# Patient Record
Sex: Female | Born: 1990 | Race: White | Hispanic: No | Marital: Single | Smoking: Former smoker
Health system: Southern US, Community
[De-identification: ages and names within clinical notes are randomized; demographics above are authoritative.]

## PROBLEM LIST (undated history)

## (undated) DIAGNOSIS — N200 Calculus of kidney: Secondary | ICD-10-CM

## (undated) DIAGNOSIS — D649 Anemia, unspecified: Secondary | ICD-10-CM

## (undated) DIAGNOSIS — Z23 Encounter for immunization: Secondary | ICD-10-CM

## (undated) DIAGNOSIS — E282 Polycystic ovarian syndrome: Secondary | ICD-10-CM

## (undated) DIAGNOSIS — N12 Tubulo-interstitial nephritis, not specified as acute or chronic: Secondary | ICD-10-CM

## (undated) DIAGNOSIS — N390 Urinary tract infection, site not specified: Secondary | ICD-10-CM

## (undated) DIAGNOSIS — B009 Herpesviral infection, unspecified: Secondary | ICD-10-CM

## (undated) DIAGNOSIS — A419 Sepsis, unspecified organism: Secondary | ICD-10-CM

## (undated) DIAGNOSIS — A599 Trichomoniasis, unspecified: Secondary | ICD-10-CM

## (undated) HISTORY — DX: Tubulo-interstitial nephritis, not specified as acute or chronic: N12

## (undated) HISTORY — DX: Urinary tract infection, site not specified: N39.0

## (undated) HISTORY — DX: Herpesviral infection, unspecified: B00.9

## (undated) HISTORY — DX: Encounter for immunization: Z23

## (undated) HISTORY — DX: Trichomoniasis, unspecified: A59.9

## (undated) HISTORY — PX: WISDOM TOOTH EXTRACTION: SHX21

## (undated) HISTORY — DX: Calculus of kidney: N20.0

---
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
## 2011-03-08 ENCOUNTER — Ambulatory Visit: Payer: Self-pay | Admitting: Family Medicine

## 2011-05-23 ENCOUNTER — Emergency Department: Payer: Self-pay | Admitting: Emergency Medicine

## 2011-09-08 ENCOUNTER — Ambulatory Visit: Payer: Self-pay

## 2011-10-06 NOTE — L&D Delivery Note (Signed)
I have seen and examined this patient and I agree with the above. Kylie Martin 4:27 AM 06/22/2012

## 2011-10-06 NOTE — L&D Delivery Note (Addendum)
Delivery Note At 10:59 PM a viable and healthy female was delivered via Vaginal, Spontaneous Delivery (Presentation: Left Occiput Anterior).  Pushed 3.5 hours and changed position to lateral lying and hands and knees, then back to semi-fowlers.  APGAR: 7, 9; weight pending.   Placenta status: Intact, Spontaneous.  Cord: 3 vessels with the following complications: None.    Anesthesia: Epidural  Episiotomy: None Lacerations: minor 1st degree, no repair needed Suture Repair: n/a Est. Blood Loss (mL): 450cc  Mom to postpartum.  Baby to nursery-stable.  Simone Curia 06/21/2012, 11:26 PM  Cord around neck x 1, reduced prior to del. I have seen and examined this patient and I agree with the above. Cam Hai 4:29 AM 06/22/2012

## 2011-11-04 ENCOUNTER — Emergency Department: Payer: Self-pay

## 2011-11-04 LAB — URINALYSIS, COMPLETE
Nitrite: NEGATIVE
Ph: 6 (ref 4.5–8.0)
Protein: NEGATIVE
Specific Gravity: 1.017 (ref 1.003–1.030)
WBC UR: 53 /HPF (ref 0–5)

## 2011-11-04 LAB — HCG, QUANTITATIVE, PREGNANCY: Beta Hcg, Quant.: 177307 m[IU]/mL — ABNORMAL HIGH

## 2011-11-04 LAB — WET PREP, GENITAL

## 2011-12-17 ENCOUNTER — Emergency Department: Payer: Self-pay | Admitting: Emergency Medicine

## 2011-12-18 LAB — CBC
MCH: 28.6 pg (ref 26.0–34.0)
MCHC: 33.4 g/dL (ref 32.0–36.0)
MCV: 86 fL (ref 80–100)
Platelet: 232 10*3/uL (ref 150–440)
RDW: 15.1 % — ABNORMAL HIGH (ref 11.5–14.5)

## 2011-12-18 LAB — HCG, QUANTITATIVE, PREGNANCY: Beta Hcg, Quant.: 71314 m[IU]/mL — ABNORMAL HIGH

## 2011-12-18 LAB — URINALYSIS, COMPLETE
Ketone: NEGATIVE
Nitrite: POSITIVE
Ph: 6 (ref 4.5–8.0)
Protein: NEGATIVE
RBC,UR: 5 /HPF (ref 0–5)
WBC UR: 144 /HPF (ref 0–5)

## 2012-01-15 LAB — OB RESULTS CONSOLE HIV ANTIBODY (ROUTINE TESTING): HIV: NONREACTIVE

## 2012-01-15 LAB — OB RESULTS CONSOLE GBS: GBS: POSITIVE

## 2012-01-15 LAB — OB RESULTS CONSOLE GC/CHLAMYDIA: Gonorrhea: NEGATIVE

## 2012-01-15 LAB — OB RESULTS CONSOLE ABO/RH: RH Type: NEGATIVE

## 2012-01-28 ENCOUNTER — Encounter (HOSPITAL_COMMUNITY): Payer: Self-pay

## 2012-01-28 ENCOUNTER — Emergency Department (HOSPITAL_COMMUNITY): Payer: Medicaid Other

## 2012-01-28 ENCOUNTER — Emergency Department (HOSPITAL_COMMUNITY)
Admission: EM | Admit: 2012-01-28 | Discharge: 2012-01-28 | Disposition: A | Discharge status: Home / Self Care | Payer: Medicaid Other | Attending: Emergency Medicine | Admitting: Emergency Medicine

## 2012-01-28 DIAGNOSIS — N949 Unspecified condition associated with female genital organs and menstrual cycle: Secondary | ICD-10-CM | POA: Insufficient documentation

## 2012-01-28 DIAGNOSIS — O26899 Other specified pregnancy related conditions, unspecified trimester: Secondary | ICD-10-CM

## 2012-01-28 DIAGNOSIS — R1031 Right lower quadrant pain: Secondary | ICD-10-CM | POA: Insufficient documentation

## 2012-01-28 DIAGNOSIS — O9989 Other specified diseases and conditions complicating pregnancy, childbirth and the puerperium: Secondary | ICD-10-CM | POA: Insufficient documentation

## 2012-01-28 DIAGNOSIS — R300 Dysuria: Secondary | ICD-10-CM | POA: Insufficient documentation

## 2012-01-28 HISTORY — DX: Anemia, unspecified: D64.9

## 2012-01-28 LAB — URINALYSIS, ROUTINE W REFLEX MICROSCOPIC
Hgb urine dipstick: NEGATIVE
Nitrite: NEGATIVE
Protein, ur: NEGATIVE mg/dL
Specific Gravity, Urine: 1.01 (ref 1.005–1.030)
Urobilinogen, UA: 0.2 mg/dL (ref 0.0–1.0)

## 2012-01-28 LAB — COMPREHENSIVE METABOLIC PANEL WITH GFR
ALT: 12 U/L (ref 0–35)
AST: 16 U/L (ref 0–37)
Albumin: 2.8 g/dL — ABNORMAL LOW (ref 3.5–5.2)
Alkaline Phosphatase: 79 U/L (ref 39–117)
BUN: 8 mg/dL (ref 6–23)
CO2: 26 meq/L (ref 19–32)
Calcium: 8.9 mg/dL (ref 8.4–10.5)
Chloride: 103 meq/L (ref 96–112)
Creatinine, Ser: 0.8 mg/dL (ref 0.50–1.10)
GFR calc Af Amer: >90 mL/min
GFR calc non Af Amer: >90 mL/min
Glucose, Bld: 88 mg/dL (ref 70–99)
Potassium: 3.3 meq/L — ABNORMAL LOW (ref 3.5–5.1)
Sodium: 137 meq/L (ref 135–145)
Total Bilirubin: 0.2 mg/dL — ABNORMAL LOW (ref 0.3–1.2)
Total Protein: 6.6 g/dL (ref 6.0–8.3)

## 2012-01-28 LAB — CBC
HCT: 36 % (ref 36.0–46.0)
Hemoglobin: 12.3 g/dL (ref 12.0–15.0)
MCH: 29.1 pg (ref 26.0–34.0)
MCHC: 34.2 g/dL (ref 30.0–36.0)
MCV: 85.1 fL (ref 78.0–100.0)
Platelets: 236 K/uL (ref 150–400)
RBC: 4.23 MIL/uL (ref 3.87–5.11)
RDW: 13.7 % (ref 11.5–15.5)
WBC: 10.9 K/uL — ABNORMAL HIGH (ref 4.0–10.5)

## 2012-01-28 LAB — DIFFERENTIAL
Basophils Absolute: 0 K/uL (ref 0.0–0.1)
Basophils Relative: 0 % (ref 0–1)
Eosinophils Absolute: 0.2 K/uL (ref 0.0–0.7)
Eosinophils Relative: 2 % (ref 0–5)
Lymphocytes Relative: 21 % (ref 12–46)
Lymphs Abs: 2.2 K/uL (ref 0.7–4.0)
Monocytes Absolute: 0.7 K/uL (ref 0.1–1.0)
Monocytes Relative: 7 % (ref 3–12)
Neutro Abs: 7.7 K/uL (ref 1.7–7.7)
Neutrophils Relative %: 70 % (ref 43–77)

## 2012-01-28 LAB — LIPASE, BLOOD: Lipase: 21 U/L (ref 11–59)

## 2012-01-28 MED ORDER — SODIUM CHLORIDE 0.9 % IV SOLN
Freq: Once | INTRAVENOUS | Status: AC
  Administered 2012-01-28: 05:00:00 via INTRAVENOUS

## 2012-01-28 MED ORDER — MORPHINE SULFATE 4 MG/ML IJ SOLN
4.0000 mg | Freq: Once | INTRAMUSCULAR | Status: AC
  Administered 2012-01-28: 4 mg via INTRAVENOUS
  Filled 2012-01-28: qty 1

## 2012-01-28 MED ORDER — ONDANSETRON HCL 4 MG/2ML IJ SOLN
4.0000 mg | Freq: Once | INTRAMUSCULAR | Status: AC
  Administered 2012-01-28: 4 mg via INTRAVENOUS
  Filled 2012-01-28: qty 2

## 2012-01-28 NOTE — ED Provider Notes (Signed)
History     CSN: [id number]  Arrival date & time 01/28/12  4098   First MD Initiated Contact with Patient 01/28/12 [phone]      Chief Complaint  Patient presents with  . Abdominal Cramping    (Consider location/radiation/quality/duration/timing/severity/associated sxs/prior treatment) HPI  Past Medical History  Diagnosis Date  . Anemia     History reviewed. No pertinent past surgical history.  History reviewed. No pertinent family history.  History  Substance Use Topics  . Smoking status: Never Smoker   . Smokeless tobacco: Not on file  . Alcohol Use: No    OB History    Grav Para Term Preterm Abortions TAB SAB Ect Mult Living   3    2           Review of Systems  Allergies  Review of patient's allergies indicates no known allergies.  Home Medications  No current outpatient prescriptions on file.  BP 124/69  Pulse 71  Temp(Src) 98.5 F (36.9 C) (Oral)  Resp 18  Ht 5\' 6"  (1.676 m)  Wt 155 lb (70.308 kg)  BMI 25.02 kg/m2  SpO2 97%  Physical Exam  ED Course  Procedures (including critical care time)  Labs Reviewed  CBC - Abnormal; Notable for the following:    WBC 10.9 (*)    All other components within normal limits  COMPREHENSIVE METABOLIC PANEL - Abnormal; Notable for the following:    Potassium 3.3 (*)    Albumin 2.8 (*)    Total Bilirubin 0.2 (*)    All other components within normal limits  DIFFERENTIAL  LIPASE, BLOOD  URINALYSIS, ROUTINE W REFLEX MICROSCOPIC   US Ob Limited  01/28/2012  *RADIOLOGY REPORT*  Clinical Data: Pelvic pain with cramping  LIMITED OBSTETRIC ULTRASOUND  Number of Fetuses: 1 Heart Rate: 135bpm Movement: Seen Presentation: Breech Placental Location: Posterior fundal, left lateral Previa: No Amniotic Fluid (Subjective): Normal, vertical pocket 4.4 cm  BPD: 4.71cm   20w   2d  MATERNAL FINDINGS: Cervix: 4.9 cm/ Uterus/Adnexae: Ovaries not seen, no pelvic fluid  IMPRESSION: Single living intrauterine pregnancy  demonstrating an estimated gestational age by BPD alone of 20 weeks 2 days.  This correlates well with provided assigned gestational age of [redacted] weeks 5 days with corresponding EDC of 06/11/2012.  The cervix appears long and closed and the visualized portion of the placenta appears unremarkable.  Subjectively normal amniotic fluid volume  Recommend followup with non-emergent complete OB 14+ wk US examination for fetal biometric evaluation and anatomic survey. This could be performed at the [facility] of [place].  Original Report Authenticated By: Bertha Stakes, M.D.     1. Pelvic pain complicating pregnancy       MDM  Recheck prior to discharge. Patient is in no acute distress. No vaginal bleeding or discharge. Ultrasound normal        Donnetta Hutching, MD 01/28/12 [phone]

## 2012-01-28 NOTE — Progress Notes (Signed)
Notified @0358  of pt arrival at [facility] ED.  Pt is 20+wks, c/o abd.pain for 2 hours, no leaking fluid/bleeding reported.  FHT 145bpm when fetal monitoring started.

## 2012-01-28 NOTE — ED Provider Notes (Signed)
History     CSN: [id number]  Arrival date & time 01/28/12  3086   First MD Initiated Contact with Patient 01/28/12 [phone]      Chief Complaint  Patient presents with  . Abdominal Cramping    (Consider location/radiation/quality/duration/timing/severity/associated sxs/prior treatment) Patient is a 21 y.o. female presenting with cramps. The history is provided by the patient.  Abdominal Cramping  She noted gradual onset about 2 hours ago of pain in the right lower abdomen without radiation. Pain is worse with palpation but nothing else seems to affect it. There is no associated nausea, vomiting, diarrhea, constipation. She has noted some dysuria. There is no change in the urinary frequency of pregnancy. She denies fever, chills, sweats and denies flank pain. There's been no vaginal bleeding. She is [redacted] weeks pregnant and there've been no complications of pregnancy. She is gravida 3, para0 with 2 miscarriages.she has not done anything for pain at home. She rates the pain at 7/10 currently but it was 10/10 at its worst.  Past Medical History  Diagnosis Date  . Anemia     History reviewed. No pertinent past surgical history.  History reviewed. No pertinent family history.  History  Substance Use Topics  . Smoking status: Never Smoker   . Smokeless tobacco: Not on file  . Alcohol Use: No    OB History    Grav Para Term Preterm Abortions TAB SAB Ect Mult Living   3    2           Review of Systems  All other systems reviewed and are negative.    Allergies  Review of patient's allergies indicates no known allergies.  Home Medications  No current outpatient prescriptions on file.  BP 124/69  Pulse 71  Temp(Src) 98.5 F (36.9 C) (Oral)  Resp 18  Ht 5\' 6"  (1.676 m)  Wt 155 lb (70.308 kg)  BMI 25.02 kg/m2  SpO2 97%  Physical Exam  Nursing note and vitals reviewed. 21 year old female who is resting comfortably and in no acute distress. Vital signs are normal. Oxygen  saturation is 97% which is normal. Head is normocephalic and atraumatic. PERRLA, EOMI. Oropharynx is clear. Neck is nontender and supple. Back is nontender and there is no CVA tenderness. Lungs are clear without rales, wheezes, or rhonchi. Heart has regular rate rhythm without murmur. Abdomen is gravid with fundus at the level of the umbilicus consistent with 20 week pregnancy. There is tenderness in the suprapubic area and right lower quadrant without rebound or guarding. Peristalsis is decreased. Extremities have no cyanosis or edema, full range of motion is present. Skin is warm and dry without rash. Neurologic: Mental status is normal, cranial nerves are intact, there are no focal motor or sensory deficits.  ED Course  Procedures (including critical care time)  Results for orders placed during the hospital encounter of 01/28/12  CBC      Component Value Range   WBC 10.9 (*) 4.0 - 10.5 (K/uL)   RBC 4.23  3.87 - 5.11 (MIL/uL)   Hemoglobin 12.3  12.0 - 15.0 (g/dL)   HCT 69.6  29.5 - 28.4 (%)   MCV 85.1  78.0 - 100.0 (fL)   MCH 29.1  26.0 - 34.0 (pg)   MCHC 34.2  30.0 - 36.0 (g/dL)   RDW 13.2  44.0 - 10.2 (%)   Platelets 236  150 - 400 (K/uL)  DIFFERENTIAL      Component Value Range   Neutrophils Relative 70  43 - 77 (%)   Neutro Abs 7.7  1.7 - 7.7 (K/uL)   Lymphocytes Relative 21  12 - 46 (%)   Lymphs Abs 2.2  0.7 - 4.0 (K/uL)   Monocytes Relative 7  3 - 12 (%)   Monocytes Absolute 0.7  0.1 - 1.0 (K/uL)   Eosinophils Relative 2  0 - 5 (%)   Eosinophils Absolute 0.2  0.0 - 0.7 (K/uL)   Basophils Relative 0  0 - 1 (%)   Basophils Absolute 0.0  0.0 - 0.1 (K/uL)  COMPREHENSIVE METABOLIC PANEL      Component Value Range   Sodium 137  135 - 145 (mEq/L)   Potassium 3.3 (*) 3.5 - 5.1 (mEq/L)   Chloride 103  96 - 112 (mEq/L)   CO2 26  19 - 32 (mEq/L)   Glucose, Bld 88  70 - 99 (mg/dL)   BUN 8  6 - 23 (mg/dL)   Creatinine, Ser 5.40  0.50 - 1.10 (mg/dL)   Calcium 8.9  8.4 - 98.1 (mg/dL)     Total Protein 6.6  6.0 - 8.3 (g/dL)   Albumin 2.8 (*) 3.5 - 5.2 (g/dL)   AST 16  0 - 37 (U/L)   ALT 12  0 - 35 (U/L)   Alkaline Phosphatase 79  39 - 117 (U/L)   Total Bilirubin 0.2 (*) 0.3 - 1.2 (mg/dL)   GFR calc non Af Amer >90  >90 (mL/min)   GFR calc Af Amer >90  >90 (mL/min)  LIPASE, BLOOD      Component Value Range   Lipase 21  11 - 59 (U/L)  URINALYSIS, ROUTINE W REFLEX MICROSCOPIC      Component Value Range   Color, Urine YELLOW  YELLOW    APPearance CLEAR  CLEAR    Specific Gravity, Urine 1.010  1.005 - 1.030    pH 7.0  5.0 - 8.0    Glucose, UA NEGATIVE  NEGATIVE (mg/dL)   Hgb urine dipstick NEGATIVE  NEGATIVE    Bilirubin Urine NEGATIVE  NEGATIVE    Ketones, ur NEGATIVE  NEGATIVE (mg/dL)   Protein, ur NEGATIVE  NEGATIVE (mg/dL)   Urobilinogen, UA 0.2  0.0 - 1.0 (mg/dL)   Nitrite NEGATIVE  NEGATIVE    Leukocytes, UA NEGATIVE  NEGATIVE    She got good relief of pain with morphine 4 mg. Laboratory workup is unremarkable. Ultrasound has been ordered and O. Be endorsed to Dr. Glendell Docker to evaluate results of ultrasound.  1. Pelvic pain complicating pregnancy       MDM  Abdominal pain in pregnancy. Possible UTI. Her pattern of tenderness is not consistent with a round ligament pain. Laboratory workup has been initiated.        Dione Booze, MD 01/28/12 [phone]

## 2012-01-28 NOTE — Discharge Instructions (Signed)
Ultrasound was normal. Tylenol for pain. Can call your primary OB/GYN doctor for followup before your next appointment

## 2012-01-28 NOTE — Progress Notes (Signed)
FHT 145bpm x6 minutes when pt initially placed on monitor.  Now difficulty tracing, recommended dopplering fht x30-60 seconds and continuing toco monitoring.  Pt dopplered x60 seconds, fht 140's, per Elpidio Galea.

## 2012-01-28 NOTE — Progress Notes (Signed)
Updated Darl Pikes RN of toco monitoring, recommended dopplering fht now, and again before discharge/transfer.

## 2012-01-28 NOTE — ED Notes (Signed)
Pt via EMS with complaints of ABD pain that started 2 hrs PTA patient is [redacted] weeks pregnant. Denies bleeding

## 2012-03-22 LAB — OB RESULTS CONSOLE ANTIBODY SCREEN: Antibody Screen: NEGATIVE

## 2012-04-28 ENCOUNTER — Observation Stay: Payer: Self-pay

## 2012-04-28 LAB — URINALYSIS, COMPLETE
Bilirubin,UR: NEGATIVE
Blood: NEGATIVE
Glucose,UR: NEGATIVE mg/dL (ref 0–75)
RBC,UR: 15 /HPF (ref 0–5)
Specific Gravity: 1.01 (ref 1.003–1.030)

## 2012-06-12 ENCOUNTER — Observation Stay: Payer: Self-pay | Admitting: Obstetrics and Gynecology

## 2012-06-17 ENCOUNTER — Telehealth (HOSPITAL_COMMUNITY): Payer: Self-pay | Admitting: *Deleted

## 2012-06-17 ENCOUNTER — Encounter (HOSPITAL_COMMUNITY): Payer: Self-pay | Admitting: *Deleted

## 2012-06-17 NOTE — Telephone Encounter (Signed)
Preadmission screen  

## 2012-06-20 ENCOUNTER — Encounter (HOSPITAL_COMMUNITY): Payer: Self-pay

## 2012-06-20 ENCOUNTER — Inpatient Hospital Stay (HOSPITAL_COMMUNITY)
Admission: RE | Admit: 2012-06-20 | Discharge: 2012-06-23 | DRG: 775 | Disposition: A | Discharge status: Home / Self Care | Payer: Medicaid Other | Source: Ambulatory Visit | Attending: Family Medicine | Admitting: Family Medicine

## 2012-06-20 DIAGNOSIS — O480 Post-term pregnancy: Principal | ICD-10-CM | POA: Diagnosis present

## 2012-06-20 DIAGNOSIS — Z22330 Carrier of Group B streptococcus: Secondary | ICD-10-CM

## 2012-06-20 DIAGNOSIS — O99892 Other specified diseases and conditions complicating childbirth: Secondary | ICD-10-CM | POA: Diagnosis present

## 2012-06-20 LAB — CBC
HCT: 35.7 % — ABNORMAL LOW (ref 36.0–46.0)
MCHC: 31.9 g/dL (ref 30.0–36.0)
MCV: 83.8 fL (ref 78.0–100.0)
Platelets: 191 10*3/uL (ref 150–400)
RDW: 16.3 % — ABNORMAL HIGH (ref 11.5–15.5)
WBC: 5.6 10*3/uL (ref 4.0–10.5)

## 2012-06-20 MED ORDER — PENICILLIN G POTASSIUM 5000000 UNITS IJ SOLR
2.5000 10*6.[IU] | INTRAMUSCULAR | Status: DC
  Administered 2012-06-21 (×6): 2.5 10*6.[IU] via INTRAVENOUS
  Filled 2012-06-20 (×11): qty 2.5

## 2012-06-20 MED ORDER — OXYCODONE-ACETAMINOPHEN 5-325 MG PO TABS
1.0000 | ORAL_TABLET | ORAL | Status: DC | PRN
  Administered 2012-06-21: 1 via ORAL
  Filled 2012-06-20: qty 1

## 2012-06-20 MED ORDER — IBUPROFEN 600 MG PO TABS
600.0000 mg | ORAL_TABLET | Freq: Four times a day (QID) | ORAL | Status: DC | PRN
  Administered 2012-06-21: 600 mg via ORAL
  Filled 2012-06-20: qty 1

## 2012-06-20 MED ORDER — ACETAMINOPHEN 325 MG PO TABS
650.0000 mg | ORAL_TABLET | ORAL | Status: DC | PRN
  Filled 2012-06-20: qty 2

## 2012-06-20 MED ORDER — OXYTOCIN 40 UNITS IN LACTATED RINGERS INFUSION - SIMPLE MED
62.5000 mL/h | Freq: Once | INTRAVENOUS | Status: DC
  Filled 2012-06-20: qty 1000

## 2012-06-20 MED ORDER — LACTATED RINGERS IV SOLN
INTRAVENOUS | Status: DC
  Administered 2012-06-20 – 2012-06-21 (×2): via INTRAVENOUS
  Administered 2012-06-21: 125 mL/h via INTRAVENOUS
  Administered 2012-06-21: 08:00:00 via INTRAVENOUS

## 2012-06-20 MED ORDER — CITRIC ACID-SODIUM CITRATE 334-500 MG/5ML PO SOLN
30.0000 mL | ORAL | Status: DC | PRN
  Administered 2012-06-21: 30 mL via ORAL
  Filled 2012-06-20: qty 15

## 2012-06-20 MED ORDER — ONDANSETRON HCL 4 MG/2ML IJ SOLN
4.0000 mg | Freq: Four times a day (QID) | INTRAMUSCULAR | Status: DC | PRN
  Administered 2012-06-21: 4 mg via INTRAVENOUS
  Filled 2012-06-20: qty 2

## 2012-06-20 MED ORDER — MISOPROSTOL 25 MCG QUARTER TABLET
25.0000 ug | ORAL_TABLET | ORAL | Status: DC | PRN
  Administered 2012-06-20 – 2012-06-21 (×2): 25 ug via VAGINAL
  Filled 2012-06-20 (×2): qty 0.25

## 2012-06-20 MED ORDER — OXYTOCIN BOLUS FROM INFUSION
500.0000 mL | Freq: Once | INTRAVENOUS | Status: AC
  Administered 2012-06-21: 500 mL via INTRAVENOUS
  Filled 2012-06-20: qty 500

## 2012-06-20 MED ORDER — LACTATED RINGERS IV SOLN
500.0000 mL | INTRAVENOUS | Status: DC | PRN
  Administered 2012-06-21: 300 mL via INTRAVENOUS

## 2012-06-20 MED ORDER — PENICILLIN G POTASSIUM 5000000 UNITS IJ SOLR
5.0000 10*6.[IU] | Freq: Once | INTRAVENOUS | Status: AC
  Administered 2012-06-20: 5 10*6.[IU] via INTRAVENOUS
  Filled 2012-06-20: qty 5

## 2012-06-20 MED ORDER — FLEET ENEMA 7-19 GM/118ML RE ENEM: 1.0000 | ENEMA | Freq: Once | RECTAL | Status: DC

## 2012-06-20 MED ORDER — TERBUTALINE SULFATE 1 MG/ML IJ SOLN: 0.2500 mg | Freq: Once | INTRAMUSCULAR | Status: AC | PRN

## 2012-06-20 MED ORDER — LIDOCAINE HCL (PF) 1 % IJ SOLN
30.0000 mL | INTRAMUSCULAR | Status: DC | PRN
  Filled 2012-06-20: qty 30

## 2012-06-20 NOTE — H&P (Signed)
Kylie Martin is a 21 y.o. female @ [redacted]w[redacted]d G3P0020 presenting for IOL for postdates.  She denies any contractions, VB, LOF or other complaints.  +FM.     History OB History    Grav Para Term Preterm Abortions TAB SAB Ect Mult Living   3    2  2         Past Medical History  Diagnosis Date  . Anemia   . HSV-2 (herpes simplex virus 2) infection   . Trichomonas    Past Surgical History  Procedure Date  . Wisdom tooth extraction    Family History: family history includes COPD in her sister; Cancer in her maternal grandfather and paternal grandfather; Diabetes in her father; Hypertension in her father, maternal grandfather, and paternal aunts; and Stroke in her maternal uncle. Social History:  reports that she has never smoked. She has never used smokeless tobacco. She reports that she does not drink alcohol or use illicit drugs.   Prenatal Transfer Tool  Maternal Diabetes: No Genetic Screening: Normal Maternal Ultrasounds/Referrals: Normal Fetal Ultrasounds or other Referrals:  None Maternal Substance Abuse:  No Significant Maternal Medications:  none Significant Maternal Lab Results:  Lab values include: Group B Strep positive, Rh negative Other Comments:  None  Review of Systems  All other systems reviewed and are negative.    Dilation: 1.5 Effacement (%): 50 Station: -2 Exam by:: Rejeana Fadness, md Height 5\' 6"  (1.676 m), weight 87.091 kg (192 lb). Maternal Exam:  Abdomen: Patient reports no abdominal tenderness. Estimated fetal weight is 6.5#.   Fetal presentation: vertex  Introitus: Normal vulva. Normal vagina.  Pelvis: adequate for delivery.   Cervix: Cervix evaluated by digital exam.     Fetal Exam Fetal Monitor Review: Mode: ultrasound.   Baseline rate: 145.  Variability: moderate (6-25 bpm).   Pattern: accelerations present and no decelerations.    Fetal State Assessment: Category I - tracings are normal.     Physical Exam  Constitutional: She is oriented to  person, place, and time. She appears well-developed and well-nourished.  HENT:  Head: Normocephalic and atraumatic.  Eyes: Conjunctivae normal and EOM are normal.  Neck: Neck supple.  Cardiovascular: Normal rate, regular rhythm and normal heart sounds.   Respiratory: Effort normal and breath sounds normal.  Neurological: She is alert and oriented to person, place, and time.  Skin: Skin is warm and dry.  Psychiatric: She has a normal mood and affect. Her behavior is normal.    Prenatal labs: ABO, Rh: A/Negative/-- (04/12 0000) Antibody: Negative (06/18 0000) Rubella: Immune (04/12 0000) RPR: Nonreactive (04/12 0000)  HBsAg: Negative (04/12 0000)  HIV: Non-reactive (04/12 0000)  GBS: Positive (04/12 0000)   Assessment/Plan:   21 yo G3P0020 @[redacted]w[redacted]d  by 18wk [place] who is here for IOL for post dates.   1) IOL - Bishops score of 5 - will plan for cytotec for cervical ripening.   - admit to L&D - routine orders  2) RH neg - s/p rhogam at 28 weeks - AB negative  3) FWB - category I -GBS + - treat with PCN  4) Anticipate SVD   Rulon Abide 06/20/2012, 8:57 PM

## 2012-06-20 NOTE — Plan of Care (Signed)
Problem: Consults Goal: Birthing Suites Patient Information Press F2 to bring up selections list Outcome: Completed/Met Date Met:  06/20/12  Inpatient induction

## 2012-06-21 ENCOUNTER — Encounter (HOSPITAL_COMMUNITY): Payer: Self-pay | Admitting: Anesthesiology

## 2012-06-21 ENCOUNTER — Encounter (HOSPITAL_COMMUNITY): Payer: Self-pay

## 2012-06-21 ENCOUNTER — Inpatient Hospital Stay (HOSPITAL_COMMUNITY): Payer: Medicaid Other | Admitting: Anesthesiology

## 2012-06-21 DIAGNOSIS — O480 Post-term pregnancy: Secondary | ICD-10-CM

## 2012-06-21 DIAGNOSIS — O9989 Other specified diseases and conditions complicating pregnancy, childbirth and the puerperium: Secondary | ICD-10-CM

## 2012-06-21 MED ORDER — PHENYLEPHRINE 40 MCG/ML (10ML) SYRINGE FOR IV PUSH (FOR BLOOD PRESSURE SUPPORT)
80.0000 ug | PREFILLED_SYRINGE | INTRAVENOUS | Status: DC | PRN
  Filled 2012-06-21 (×2): qty 5

## 2012-06-21 MED ORDER — OXYTOCIN 40 UNITS IN LACTATED RINGERS INFUSION - SIMPLE MED
1.0000 m[IU]/min | INTRAVENOUS | Status: DC
  Administered 2012-06-21: 2 m[IU]/min via INTRAVENOUS

## 2012-06-21 MED ORDER — ZOLPIDEM TARTRATE 5 MG PO TABS: 5.0000 mg | ORAL_TABLET | Freq: Once | ORAL | Status: DC

## 2012-06-21 MED ORDER — SODIUM BICARBONATE 8.4 % IV SOLN
INTRAVENOUS | Status: DC | PRN
  Administered 2012-06-21: 4 mL via EPIDURAL

## 2012-06-21 MED ORDER — TERBUTALINE SULFATE 1 MG/ML IJ SOLN: 0.2500 mg | Freq: Once | INTRAMUSCULAR | Status: AC | PRN

## 2012-06-21 MED ORDER — EPHEDRINE 5 MG/ML INJ
10.0000 mg | INTRAVENOUS | Status: DC | PRN
  Filled 2012-06-21 (×2): qty 4

## 2012-06-21 MED ORDER — DIPHENHYDRAMINE HCL 50 MG/ML IJ SOLN: 12.5000 mg | INTRAMUSCULAR | Status: DC | PRN

## 2012-06-21 MED ORDER — PHENYLEPHRINE 40 MCG/ML (10ML) SYRINGE FOR IV PUSH (FOR BLOOD PRESSURE SUPPORT): 80.0000 ug | PREFILLED_SYRINGE | INTRAVENOUS | Status: DC | PRN

## 2012-06-21 MED ORDER — LACTATED RINGERS IV SOLN
500.0000 mL | Freq: Once | INTRAVENOUS | Status: AC
  Administered 2012-06-21: 500 mL via INTRAVENOUS

## 2012-06-21 MED ORDER — EPHEDRINE 5 MG/ML INJ: 10.0000 mg | INTRAVENOUS | Status: DC | PRN

## 2012-06-21 MED ORDER — FENTANYL 2.5 MCG/ML BUPIVACAINE 1/10 % EPIDURAL INFUSION (WH - ANES)
14.0000 mL/h | INTRAMUSCULAR | Status: DC
  Administered 2012-06-21 (×2): 14 mL/h via EPIDURAL
  Filled 2012-06-21 (×4): qty 60

## 2012-06-21 MED ORDER — ZOLPIDEM TARTRATE 5 MG PO TABS
5.0000 mg | ORAL_TABLET | Freq: Every evening | ORAL | Status: DC | PRN
  Administered 2012-06-21: 5 mg via ORAL

## 2012-06-21 MED ORDER — FENTANYL 2.5 MCG/ML BUPIVACAINE 1/10 % EPIDURAL INFUSION (WH - ANES)
INTRAMUSCULAR | Status: DC | PRN
  Administered 2012-06-21: 14 mL/h via EPIDURAL

## 2012-06-21 MED ORDER — BUPIVACAINE HCL (PF) 0.25 % IJ SOLN
INTRAMUSCULAR | Status: DC | PRN
  Administered 2012-06-21 (×2): 5 mL

## 2012-06-21 MED ORDER — BUTORPHANOL TARTRATE 1 MG/ML IJ SOLN
1.0000 mg | INTRAMUSCULAR | Status: DC | PRN
  Administered 2012-06-21: 1 mg via INTRAVENOUS
  Filled 2012-06-21: qty 1

## 2012-06-21 NOTE — Anesthesia Procedure Notes (Signed)
Epidural Patient location during procedure: OB  Preanesthetic Checklist Completed: patient identified, site marked, surgical consent, pre-op evaluation, timeout performed, IV checked, risks and benefits discussed and monitors and equipment checked  Epidural Patient position: sitting Prep: site prepped and draped and DuraPrep Patient monitoring: continuous pulse ox and blood pressure Approach: midline Injection technique: LOR air  Needle:  Needle type: Tuohy  Needle gauge: 17 G Needle length: 9 cm and 9 Needle insertion depth: 6 cm Catheter type: closed end flexible Catheter size: 19 Gauge Catheter at skin depth: 12 cm Test dose: negative  Assessment Events: blood not aspirated, injection not painful, no injection resistance, negative IV test and no paresthesia  Additional Notes Dosing of Epidural:  1st dose, through needle ............................................. epi 1:200K + Xylocaine 40 mg  2nd dose, through catheter, after waiting 3 minutes.....epi 1:200K + Xylocaine 40 mg  3rd dose, through catheter after waiting 3 minutes .............................Marcaine   4mg   ( mg Marcaine are expressed as equivilent  cc's medication removed from the 0.1%Bupiv / fentanyl syringe from L&D pump)  ( 2% Xylo charted as a single dose in Epic Meds for ease of charting; actual dosing was fractionated as above, for saftey's sake)  As each dose occurred, patient was free of IV sx; and patient exhibited no evidence of SA injection.  Patient is more comfortable after epidural dosed. Please see RN's note for documentation of vital signs,and FHR which are stable.  Patient reminded not to try to ambulate with numb legs, and that an RN must be present the 1st time she attempts to get up.    

## 2012-06-21 NOTE — Progress Notes (Signed)
Kylie Martin is a 21 y.o. G3P0020 at [redacted]w[redacted]d admitted for induction of labor due to Post dates.  Subjective: Pt is feeling increased pain with contractions, has epidural in place  Objective: BP 138/87  Pulse 89  Temp 98.1 F (36.7 C) (Oral)  Resp 20  Ht 5\' 6"  (1.676 m)  Wt 192 lb (87.091 kg)  BMI 30.99 kg/m2  SpO2 99% I/O last 3 completed shifts: In: -  Out: 400 [Urine:400]    FHT:  FHR: 120 bpm, variability: moderate,  accelerations:  Present,  decelerations:  Present early, variable UC:   MVU = 280 SVE:   Dilation: 7.5 Effacement (%): 80;90 Station: 0 Exam by:: Renaldo Harrison, RN  Labs: Lab Results  Component Value Date   WBC 5.6 06/20/2012   HGB 11.4* 06/20/2012   HCT 35.7* 06/20/2012   MCV 83.8 06/20/2012   PLT 191 06/20/2012    Assessment / Plan: Induction of labor due to postterm,  progressing well on pitocin  Labor: progressing on pitocin Fetal Wellbeing:  Category II Pain Control:  Epidural and anesthsia to redose epidural as needed Anticipated MOD:  NSVD  Jullian Clayson 06/21/2012, 7:49 PM

## 2012-06-21 NOTE — Progress Notes (Addendum)
Kylie Martin is a 21 y.o. G3P0020 at [redacted]w[redacted]d admitted for induction of labor due to Post dates.  Subjective: Pt feeling strong pain lower abdomen/near vagina with every contraction every 3-5 minutes.  Has not yet requested epidural.  Objective: BP 130/90  Pulse 77  Temp 97.8 F (36.6 C) (Oral)  Resp 18  Ht 5\' 6"  (1.676 m)  Wt 87.091 kg (192 lb)  BMI 30.99 kg/m2  SpO2 96%     FHT:  FHR: 120s bpm, variability: moderate,  accelerations:  Present,  decelerations:  Absent UC:   irregular, every 2-4 minutes SVE:   Dilation: 4.5 Effacement (%): 70 Station: -3 Exam by:: Kylie Harrison, RN  Labs: Lab Results  Component Value Date   WBC 5.6 06/20/2012   HGB 11.4* 06/20/2012   HCT 35.7* 06/20/2012   MCV 83.8 06/20/2012   PLT 191 06/20/2012    Assessment / Plan: Induction of labor due to postdates,  progressing well on pitocin  Labor: s/p cytotec x 2 overnight and foley bulb this AM which came out around 9 am, pitocin at 4 increasing 2 every 2 , membranes in tact Preeclampsia:  n/a Fetal Wellbeing:  Category I Pain Control:  Labor support without medications, epidural upon request I/D:  n/a Anticipated MOD:  NSVD  Kylie Martin 06/21/2012, 10:07 AM

## 2012-06-21 NOTE — Progress Notes (Signed)
Kylie Martin is a 21 y.o. G3P0020 at [redacted]w[redacted]d   Subjective: Pushing x 2 hours now; having upper back pain recently which is making it difficult to find a comfortable position to push in; has been in semi-fowlers so far  Objective: BP 105/50  Pulse 123  Temp 98.8 F (37.1 C) (Oral)  Resp 22  Ht 5\' 6"  (1.676 m)  Wt 87.091 kg (192 lb)  BMI 30.99 kg/m2  SpO2 96% I/O last 3 completed shifts: In: -  Out: 400 [Urine:400]    FHT:  FHR: 120 bpm, variability: moderate,  accelerations:  Present,  decelerations:  Absent; occ mi variables UC:   regular, every 3 minutes SVE:   Dilation: 10 Effacement (%): 80;90 Station: +2 Exam by:: T. Lessard RN; more vtx visible with pushing than one hour ago  Labs: Lab Results  Component Value Date   WBC 5.6 06/20/2012   HGB 11.4* 06/20/2012   HCT 35.7* 06/20/2012   MCV 83.8 06/20/2012   PLT 191 06/20/2012    Assessment / Plan: Pushing x 2 hours  Will try some position changes as pt can tolerate due to back pain Anticipate SVD   Kylie Martin 06/21/2012, 10:04 PM

## 2012-06-21 NOTE — H&P (Signed)
I have seen and examined patient and reviewed NST and agree with above. Napoleon Form, MD

## 2012-06-21 NOTE — Progress Notes (Signed)
Kylie Martin is a 21 y.o. G3P0020 at [redacted]w[redacted]d admitted for induction of labor due to Post dates.   Subjective: Pt feeling sharp left groin pain.  Objective: BP 123/82  Pulse 77  Temp 97.8 F (36.6 C) (Oral)  Resp 18  Ht 5\' 6"  (1.676 m)  Wt 87.091 kg (192 lb)  BMI 30.99 kg/m2  SpO2 95%   Total I/O In: -  Out: 400 [Urine:400]  FHT:  FHR: 120s bpm, variability: moderate,  accelerations:  Present,  decelerations:  Present early and variable UC:   regular, every 2 minutes SVE:   Dilation: 7 Effacement (%): 90 Station: -1 Exam by:: Renaldo Harrison, RN  Labs: Lab Results  Component Value Date   WBC 5.6 06/20/2012   HGB 11.4* 06/20/2012   HCT 35.7* 06/20/2012   MCV 83.8 06/20/2012   PLT 191 06/20/2012    Assessment / Plan: 21 yo G3P0020 at 41.2 with induction of labor due to postdates, progressing well on pitocin at 8   Labor: s/p cytotec x 2 overnight and foley bulb this AM which came out around 9 am, pit started, SROM at 12:50pm and IUPC placed last check. MVUs adequate and pitocin decr to 6 at 5:15pm because ~350MVU with some variable decelerations  Preeclampsia: n/a  Fetal Wellbeing: Category II Pain Control: Epidural and PCA I/D: n/a  Anticipated MOD: NSVD   Simone Curia 06/21/2012, 6:09 PM

## 2012-06-21 NOTE — Progress Notes (Signed)
Kylie Martin is a 21 y.o. G3P0020 at [redacted]w[redacted]d by admitted for induction of labor due to Post dates.  Subjective: Pt feeling a bit of pressure in pelvis.  Continues to feel fluid leaking from vagina.  Objective: BP 124/79  Pulse 75  Temp 98.2 F (36.8 C) (Oral)  Resp 18  Ht 5\' 6"  (1.676 m)  Wt 87.091 kg (192 lb)  BMI 30.99 kg/m2  SpO2 95%   Total I/O In: -  Out: 200 [Urine:200]  FHT:  FHR: 120s bpm, variability: moderate,  accelerations:  Present,  decelerations:  Present variable UC:   regular, every 2-3 minutes SVE:   Dilation: 6.5 Effacement (%): 90 Station: -2 Exam by:: Benjamin Stain, MD Resident, Daphine Deutscher, PA Student  Labs: Lab Results  Component Value Date   WBC 5.6 06/20/2012   HGB 11.4* 06/20/2012   HCT 35.7* 06/20/2012   MCV 83.8 06/20/2012   PLT 191 06/20/2012    Assessment / Plan: Induction of labor due to postdates, progressing well on pitocin at 8  Labor: s/p cytotec x 2 overnight and foley bulb this AM which came out around 9 am, pitocin at 8 increasing 2 every 2 , SROM at 12:50pm and IUPC placed now.  Recheck cervix once adequate MVUs.  Preeclampsia: n/a  Fetal Wellbeing: Category I  Pain Control: Epidural  I/D: n/a  Anticipated MOD: NSVD   Simone Curia 06/21/2012, 4:32 PM

## 2012-06-21 NOTE — Progress Notes (Signed)
Kylie Martin is a 21 y.o. G3P0020 at [redacted]w[redacted]d by ultrasound admitted for induction of labor due to Post dates. Due date 06/12/12.  Subjective: Pt starting to feel some contractions occasionally. +fm.   Objective: BP 110/59  Pulse 66  Temp 98.1 F (36.7 C) (Oral)  Resp 18  Ht 5\' 6"  (1.676 m)  Wt 87.091 kg (192 lb)  BMI 30.99 kg/m2      FHT:  FHR: 120 bpm, variability: moderate,  accelerations:  Present,  decelerations:  Absent UC:   occasional SVE:   Dilation: 1.5 Effacement (%): 60 Station: -2 Exam by:: Danzig Macgregor, md  Labs: Lab Results  Component Value Date   WBC 5.6 06/20/2012   HGB 11.4* 06/20/2012   HCT 35.7* 06/20/2012   MCV 83.8 06/20/2012   PLT 191 06/20/2012    Assessment / Plan: IOL due to post dates  Labor: s/p cytotec x 2.  Foley bulb placed now with 60cc of fluid Preeclampsia:  na Fetal Wellbeing:  Category I Pain Control:  Labor support without medications I/D:  n/a Anticipated MOD:  NSVD  Rulon Abide 06/21/2012, 6:20 AM

## 2012-06-21 NOTE — Anesthesia Preprocedure Evaluation (Signed)

## 2012-06-21 NOTE — Progress Notes (Signed)
Kylie Martin is a 21 y.o. G3P0020 at [redacted]w[redacted]d by admitted for induction of labor due to Post dates.  Subjective: Pt with epidural doing well now in no discomfort.  SROM at 12:50PM.  Objective: BP 118/79  Pulse 62  Temp 98 F (36.7 C) (Oral)  Resp 18  Ht 5\' 6"  (1.676 m)  Wt 87.091 kg (192 lb)  BMI 30.99 kg/m2  SpO2 95%     FHT:  FHR: 120s bpm, variability: moderate,  accelerations:  Present,  decelerations:  Present a few variables and earlies UC:   regular, every 3-5 minutes SVE:   Dilation: 6 Effacement (%): 80 Station: -1 Exam by:: Renaldo Harrison, RN  Labs: Lab Results  Component Value Date   WBC 5.6 06/20/2012   HGB 11.4* 06/20/2012   HCT 35.7* 06/20/2012   MCV 83.8 06/20/2012   PLT 191 06/20/2012    Assessment / Plan: Induction of labor due to postdates, progressing well on pitocin  Labor: s/p cytotec x 2 overnight and foley bulb this AM which came out around 9 am, pitocin at 6 increasing 2 every 2 , SROM at 12:50pm Preeclampsia: n/a  Fetal Wellbeing: Category I  Pain Control: Epidural I/D: n/a  Anticipated MOD: NSVD  Simone Curia 06/21/2012, 12:54 PM

## 2012-06-22 ENCOUNTER — Encounter (HOSPITAL_COMMUNITY): Payer: Self-pay

## 2012-06-22 MED ORDER — SENNOSIDES-DOCUSATE SODIUM 8.6-50 MG PO TABS
2.0000 | ORAL_TABLET | Freq: Every day | ORAL | Status: DC
  Administered 2012-06-22: 2 via ORAL

## 2012-06-22 MED ORDER — RHO D IMMUNE GLOBULIN 1500 UNIT/2ML IJ SOLN
300.0000 ug | Freq: Once | INTRAMUSCULAR | Status: AC
  Administered 2012-06-23: 300 ug via INTRAMUSCULAR
  Filled 2012-06-22: qty 2

## 2012-06-22 MED ORDER — BENZOCAINE-MENTHOL 20-0.5 % EX AERO
1.0000 "application " | INHALATION_SPRAY | CUTANEOUS | Status: DC | PRN
  Administered 2012-06-23: 1 via TOPICAL
  Filled 2012-06-22 (×2): qty 56

## 2012-06-22 MED ORDER — WITCH HAZEL-GLYCERIN EX PADS: 1.0000 "application " | MEDICATED_PAD | CUTANEOUS | Status: DC | PRN

## 2012-06-22 MED ORDER — TETANUS-DIPHTH-ACELL PERTUSSIS 5-2.5-18.5 LF-MCG/0.5 IM SUSP: 0.5000 mL | Freq: Once | INTRAMUSCULAR | Status: DC

## 2012-06-22 MED ORDER — DIBUCAINE 1 % RE OINT: 1.0000 "application " | TOPICAL_OINTMENT | RECTAL | Status: DC | PRN

## 2012-06-22 MED ORDER — SIMETHICONE 80 MG PO CHEW: 80.0000 mg | CHEWABLE_TABLET | ORAL | Status: DC | PRN

## 2012-06-22 MED ORDER — PRENATAL MULTIVITAMIN CH
1.0000 | ORAL_TABLET | Freq: Every day | ORAL | Status: DC
  Administered 2012-06-22: 1 via ORAL
  Filled 2012-06-22 (×2): qty 1

## 2012-06-22 MED ORDER — IBUPROFEN 600 MG PO TABS
600.0000 mg | ORAL_TABLET | Freq: Four times a day (QID) | ORAL | Status: DC
  Administered 2012-06-22 – 2012-06-23 (×5): 600 mg via ORAL
  Filled 2012-06-22 (×5): qty 1

## 2012-06-22 MED ORDER — ZOLPIDEM TARTRATE 5 MG PO TABS: 5.0000 mg | ORAL_TABLET | Freq: Every evening | ORAL | Status: DC | PRN

## 2012-06-22 MED ORDER — OXYCODONE-ACETAMINOPHEN 5-325 MG PO TABS
1.0000 | ORAL_TABLET | ORAL | Status: DC | PRN
  Administered 2012-06-22: 1 via ORAL
  Administered 2012-06-22: 2 via ORAL
  Administered 2012-06-22 – 2012-06-23 (×2): 1 via ORAL
  Filled 2012-06-22: qty 2
  Filled 2012-06-22 (×3): qty 1

## 2012-06-22 MED ORDER — PRENATAL MULTIVITAMIN CH: 1.0000 | ORAL_TABLET | Freq: Every day | ORAL | Status: DC

## 2012-06-22 MED ORDER — DIPHENHYDRAMINE HCL 25 MG PO CAPS: 25.0000 mg | ORAL_CAPSULE | Freq: Four times a day (QID) | ORAL | Status: DC | PRN

## 2012-06-22 MED ORDER — LANOLIN HYDROUS EX OINT: TOPICAL_OINTMENT | CUTANEOUS | Status: DC | PRN

## 2012-06-22 MED ORDER — ONDANSETRON HCL 4 MG PO TABS: 4.0000 mg | ORAL_TABLET | ORAL | Status: DC | PRN

## 2012-06-22 MED ORDER — ONDANSETRON HCL 4 MG/2ML IJ SOLN: 4.0000 mg | INTRAMUSCULAR | Status: DC | PRN

## 2012-06-22 NOTE — Progress Notes (Signed)
Post Partum Day 1 Subjective: no complaints, up ad lib, voiding, tolerating PO and + flatus  Objective: Blood pressure 117/77, pulse 85, temperature 98.7 F (37.1 C), temperature source Oral, resp. rate 18, height 5\' 6"  (1.676 m), weight 87.091 kg (192 lb), SpO2 98.00%, unknown if currently breastfeeding.  Physical Exam:  General: alert, cooperative and no distress Lochia: appropriate Uterine Fundus: firm Incision: na DVT Evaluation: No evidence of DVT seen on physical exam.   Basename 06/20/12 2000  HGB 11.4*  HCT 35.7*    Assessment/Plan: Plan for discharge tomorrow, Breastfeeding and Contraception micronor and then plan for IUD   LOS: 2 days   Rulon Abide 06/22/2012, 7:52 AM

## 2012-06-22 NOTE — Anesthesia Postprocedure Evaluation (Signed)
Anesthesia Post Note  Patient: Kylie Martin  Procedure(s) Performed: * No procedures listed *  Anesthesia type: Epidural  Patient location: Mother/Baby  Post pain: Pain level controlled  Post assessment: Post-op Vital signs reviewed  Last Vitals:  Filed Vitals:   06/22/12 0615  BP: 117/77  Pulse: 85  Temp: 37.1 C  Resp: 18    Post vital signs: Reviewed  Level of consciousness:alert  Complications: No apparent anesthesia complications

## 2012-06-22 NOTE — Progress Notes (Signed)
I have seen and examined this patient and I agree with the above. Kylie Martin 9:23 AM 06/22/2012

## 2012-06-22 NOTE — Progress Notes (Signed)
UR chart review completed.  

## 2012-06-23 MED ORDER — IBUPROFEN 600 MG PO TABS: 600.0000 mg | ORAL_TABLET | Freq: Four times a day (QID) | ORAL | Status: DC

## 2012-06-23 MED ORDER — NORETHINDRONE 0.35 MG PO TABS: 1.0000 | ORAL_TABLET | Freq: Every day | ORAL | Status: DC

## 2012-06-23 NOTE — Discharge Summary (Signed)
Obstetric Discharge Summary Reason for Admission: induction of labor for postdates Prenatal Procedures: none Intrapartum Procedures: spontaneous vaginal delivery, GBS prophylaxis Postpartum Procedures: none Complications-Operative and Postpartum: none Hemoglobin  Date Value Range Status  06/20/2012 11.4* 12.0 - 15.0 g/dL Final     HCT  Date Value Range Status  06/20/2012 35.7* 36.0 - 46.0 % Final   [name] is a 21 yo G1 who presented for IOL for post dates @ [redacted]w[redacted]d on 9/16.  She was induced successfully with cytotec, foley bulb and pitocin and delivered a baby girl via SVD on 9/17. She was GBS + with adequate ppx. Her post partum care was uncomplicated and she was stable for discharge home on PPD#2.    Physical Exam:  General: alert, cooperative and no distress Lochia: appropriate Uterine Fundus: firm Incision: na DVT Evaluation: No evidence of DVT seen on physical exam.  Discharge Diagnoses: Post-date pregnancy  Discharge Information: Date: 06/23/2012 Activity: pelvic rest Diet: routine Medications: PNV, Ibuprofen and Colace Condition: stable Instructions: refer to practice specific booklet Discharge to: home   Newborn Data: Live born female  Birth Weight: 8 lb 3.6 oz (3730 g) APGAR: 7, 9  Home with mother.  Rulon Abide 06/23/2012, 7:42 AM  I have seen and examined this patient and agree the above assessment. CRESENZO-DISHMAN,Kylie Martin 06/23/2012 7:53 AM

## 2012-06-24 LAB — RH IG WORKUP (INCLUDES ABO/RH)
ABO/RH(D): A NEG
Antibody Screen: NEGATIVE

## 2012-11-21 ENCOUNTER — Emergency Department: Payer: Self-pay | Admitting: Emergency Medicine

## 2012-11-21 LAB — COMPREHENSIVE METABOLIC PANEL
Albumin: 3.6 g/dL (ref 3.4–5.0)
Alkaline Phosphatase: 114 U/L (ref 50–136)
BUN: 9 mg/dL (ref 7–18)
Calcium, Total: 8.8 mg/dL (ref 8.5–10.1)
Chloride: 106 mmol/L (ref 98–107)
Glucose: 91 mg/dL (ref 65–99)
Osmolality: 276 (ref 275–301)
Sodium: 139 mmol/L (ref 136–145)
Total Protein: 8 g/dL (ref 6.4–8.2)

## 2012-11-21 LAB — CBC
MCV: 83 fL (ref 80–100)
RDW: 13.4 % (ref 11.5–14.5)

## 2012-11-21 LAB — URINALYSIS, COMPLETE
Bilirubin,UR: NEGATIVE
Glucose,UR: NEGATIVE mg/dL (ref 0–75)
Nitrite: POSITIVE
RBC,UR: 12 /HPF (ref 0–5)

## 2013-01-06 LAB — URINE CULTURE

## 2013-02-20 ENCOUNTER — Emergency Department (HOSPITAL_COMMUNITY)
Admission: EM | Admit: 2013-02-20 | Discharge: 2013-02-20 | Disposition: A | Discharge status: Home / Self Care | Payer: Self-pay | Attending: Emergency Medicine | Admitting: Emergency Medicine

## 2013-02-20 ENCOUNTER — Encounter (HOSPITAL_COMMUNITY): Payer: Self-pay | Admitting: *Deleted

## 2013-02-20 DIAGNOSIS — N390 Urinary tract infection, site not specified: Secondary | ICD-10-CM | POA: Insufficient documentation

## 2013-02-20 DIAGNOSIS — R300 Dysuria: Secondary | ICD-10-CM | POA: Insufficient documentation

## 2013-02-20 DIAGNOSIS — Z8619 Personal history of other infectious and parasitic diseases: Secondary | ICD-10-CM | POA: Insufficient documentation

## 2013-02-20 DIAGNOSIS — Z862 Personal history of diseases of the blood and blood-forming organs and certain disorders involving the immune mechanism: Secondary | ICD-10-CM | POA: Insufficient documentation

## 2013-02-20 DIAGNOSIS — R509 Fever, unspecified: Secondary | ICD-10-CM | POA: Insufficient documentation

## 2013-02-20 DIAGNOSIS — R110 Nausea: Secondary | ICD-10-CM | POA: Insufficient documentation

## 2013-02-20 DIAGNOSIS — Z3202 Encounter for pregnancy test, result negative: Secondary | ICD-10-CM | POA: Insufficient documentation

## 2013-02-20 LAB — CBC WITH DIFFERENTIAL/PLATELET
Basophils Absolute: 0 10*3/uL (ref 0.0–0.1)
HCT: 40.9 % (ref 36.0–46.0)
Hemoglobin: 13.4 g/dL (ref 12.0–15.0)
Lymphocytes Relative: 13 % (ref 12–46)
Lymphs Abs: 1.5 10*3/uL (ref 0.7–4.0)
Monocytes Absolute: 1.2 10*3/uL — ABNORMAL HIGH (ref 0.1–1.0)
Neutro Abs: 8.5 10*3/uL — ABNORMAL HIGH (ref 1.7–7.7)
RBC: 4.89 MIL/uL (ref 3.87–5.11)
RDW: 13.3 % (ref 11.5–15.5)
WBC: 11.2 10*3/uL — ABNORMAL HIGH (ref 4.0–10.5)

## 2013-02-20 LAB — URINALYSIS, ROUTINE W REFLEX MICROSCOPIC
Glucose, UA: NEGATIVE mg/dL
Specific Gravity, Urine: 1.015 (ref 1.005–1.030)
pH: 6.5 (ref 5.0–8.0)

## 2013-02-20 LAB — URINE MICROSCOPIC-ADD ON

## 2013-02-20 LAB — BASIC METABOLIC PANEL
CO2: 27 mEq/L (ref 19–32)
Chloride: 99 mEq/L (ref 96–112)
Creatinine, Ser: 0.97 mg/dL (ref 0.50–1.10)
Glucose, Bld: 109 mg/dL — ABNORMAL HIGH (ref 70–99)

## 2013-02-20 LAB — POCT PREGNANCY, URINE: Preg Test, Ur: NEGATIVE

## 2013-02-20 MED ORDER — CEFTRIAXONE SODIUM 1 G IJ SOLR: 1.0000 g | Freq: Once | INTRAMUSCULAR | Status: DC

## 2013-02-20 MED ORDER — DEXTROSE 5 % IV SOLN
1.0000 g | Freq: Once | INTRAVENOUS | Status: AC
  Administered 2013-02-20: 1 g via INTRAVENOUS
  Filled 2013-02-20: qty 10

## 2013-02-20 MED ORDER — CEPHALEXIN 500 MG PO CAPS: 500.0000 mg | ORAL_CAPSULE | Freq: Four times a day (QID) | ORAL | Status: DC

## 2013-02-20 MED ORDER — SODIUM CHLORIDE 0.9 % IV BOLUS (SEPSIS)
1000.0000 mL | Freq: Once | INTRAVENOUS | Status: AC
  Administered 2013-02-20: 1000 mL via INTRAVENOUS

## 2013-02-20 MED ORDER — PROMETHAZINE HCL 25 MG PO TABS: 25.0000 mg | ORAL_TABLET | Freq: Four times a day (QID) | ORAL | Status: DC | PRN

## 2013-02-20 MED ORDER — ACETAMINOPHEN 500 MG PO TABS
1000.0000 mg | ORAL_TABLET | Freq: Once | ORAL | Status: AC
  Administered 2013-02-20: 1000 mg via ORAL
  Filled 2013-02-20: qty 2

## 2013-02-20 NOTE — ED Provider Notes (Signed)
History    This chart was scribed for [name], MD by Marlyne Beards, ED Scribe. The patient was seen in room APA09/APA09. Patient's care was started at 4:47 PM.    CSN: [id number]  Arrival date & time 02/20/13  1448   First MD Initiated Contact with Patient 02/20/13 1647      Chief Complaint  Patient presents with  . Abdominal Pain    (Consider location/radiation/quality/duration/timing/severity/associated sxs/prior treatment) Patient is a 22 y.o. female presenting with abdominal pain. The history is provided by the patient. No language interpreter was used.  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 Pt states that she was running a high fever this morning and which was alleviated after taking some tylenol. Pt states that the abdominal pain is most persistent in her sides. Pt denies cough, vomiting, diarrhea, SOB, and any other associated symptoms.   Past Medical History  Diagnosis Date  . Anemia   . HSV-2 (herpes simplex virus 2) infection   . Trichomonas     Past Surgical History  Procedure Laterality Date  . Wisdom tooth extraction      Family History  Problem Relation Age of Onset  . Hypertension Father   . Diabetes Father   . COPD Sister     born with RSV has immune problems with lung infections  . Stroke Maternal Uncle   . Hypertension Paternal Aunt   . Hypertension Maternal Grandfather   . Cancer Maternal Grandfather     lung  . Cancer Paternal Grandfather     lung  . Hypertension Paternal Aunt     History  Substance Use Topics  . Smoking status: Never Smoker   . Smokeless  tobacco: Never Used  . Alcohol Use: No    OB History   Grav Para Term Preterm Abortions TAB SAB Ect Mult Living   3 1 1  2  2   1       Review of Systems  Constitutional: Positive for fever. Negative for appetite change and fatigue.  HENT: Negative for congestion, sinus pressure and ear discharge.   Eyes: Negative for discharge.  Respiratory: Negative for cough.   Cardiovascular: Negative for chest pain.  Gastrointestinal: Positive for nausea and abdominal pain. Negative for vomiting and diarrhea.  Genitourinary: Positive for dysuria. Negative for frequency and hematuria.  Musculoskeletal: Negative for back pain.  Skin: Negative for rash.  Neurological: Negative for seizures and headaches.  Psychiatric/Behavioral: Negative for hallucinations.    Allergies  Review of patient's allergies indicates no known allergies.  Home Medications   Current Outpatient Rx  Name  Route  Sig  Dispense  Refill  . acetaminophen (TYLENOL) 500 MG tablet   Oral   Take 1,000 mg by mouth every 6 (six) hours as needed for pain or fever.         . ciprofloxacin (CIPRO) 250 MG tablet   Oral   Take 250 mg by mouth 2 (two) times daily.           BP 116/71  Pulse 94  Temp(Src) 98.4 F (36.9 C) (Oral)  Resp 18  Ht 5'  6" (1.676 m)  Wt 150 lb (68.04 kg)  BMI 24.22 kg/m2  SpO2 100%  LMP 02/06/2013  Breastfeeding? No  Physical Exam  Nursing note and vitals reviewed. Constitutional: She is oriented to person, place, and time. She appears well-developed.  HENT:  Head: Normocephalic.  Eyes: Conjunctivae and EOM are normal. No scleral icterus.  Neck: Neck supple. No thyromegaly present.  Cardiovascular: Normal rate and regular rhythm.  Exam reveals no gallop and no friction rub.   No murmur heard. Pulmonary/Chest: No stridor. She has no wheezes. She has no rales. She exhibits no tenderness.  Abdominal: She exhibits no distension. There is tenderness. There is no rebound.  Mild superpubic  and right flank tenderness.   Musculoskeletal: Normal range of motion. She exhibits no edema.  Lymphadenopathy:    She has no cervical adenopathy.  Neurological: She is oriented to person, place, and time. Coordination normal.  Skin: No rash noted. No erythema.  Psychiatric: She has a normal mood and affect. Her behavior is normal.    ED Course  Procedures (including critical care time) DIAGNOSTIC STUDIES: Oxygen Saturation is 100% on room air, normal by my interpretation.    COORDINATION OF CARE:  4:55 PM Discussed with pt of possible kidney infection and ordered some antibiotics to be administered and pt agrees.    Labs Reviewed  URINALYSIS, ROUTINE W REFLEX MICROSCOPIC - Abnormal; Notable for the following:    APPearance CLOUDY (*)    Hgb urine dipstick SMALL (*)    Protein, ur TRACE (*)    Leukocytes, UA SMALL (*)    All other components within normal limits  CBC WITH DIFFERENTIAL - Abnormal; Notable for the following:    WBC 11.2 (*)    Neutro Abs 8.5 (*)    Monocytes Absolute 1.2 (*)    All other components within normal limits  BASIC METABOLIC PANEL - Abnormal; Notable for the following:    Glucose, Bld 109 (*)    GFR calc non Af Amer 84 (*)    All other components within normal limits  URINE MICROSCOPIC-ADD ON - Abnormal; Notable for the following:    Squamous Epithelial / LPF MANY (*)    Bacteria, UA MANY (*)    All other components within normal limits  URINE CULTURE  POCT PREGNANCY, URINE   No results found.   No diagnosis found.    MDM      The chart was scribed for me under my direct supervision.  I personally performed the history, physical, and medical decision making and all procedures in the evaluation of this patient.[name], MD 02/20/13 [phone]

## 2013-02-20 NOTE — ED Notes (Signed)
x1 unsuccessful IV attempt made by Tonita Phoenix RN. Enid Derry RN to room to attempt IV access.

## 2013-02-20 NOTE — ED Notes (Signed)
abd pain, fever, dysuria.Nausea, no vomiting.

## 2013-02-21 LAB — URINE CULTURE

## 2013-09-15 IMAGING — US US OB US >=[ID] SNGL FETUS
1 series · 17 of 28 positions shown · non-contrast
Comparison: none

REASON FOR EXAM: vaginal bleeding
COMMENTS:

[Series 1: us ob us >=(id) sngl fetus · 17 of 87 slices shown]
[im 1/87]
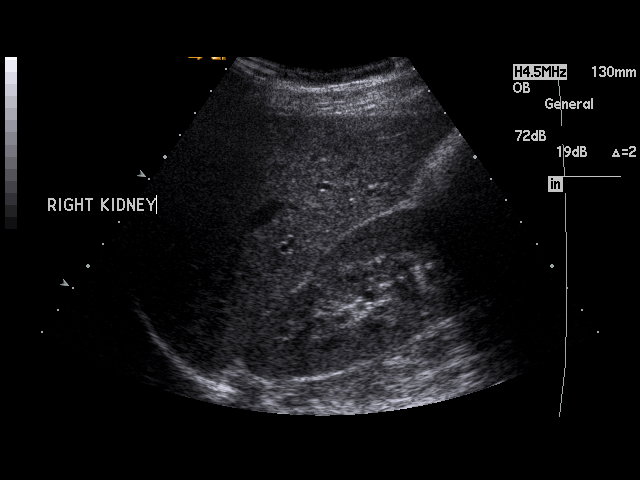
[im 7/87]
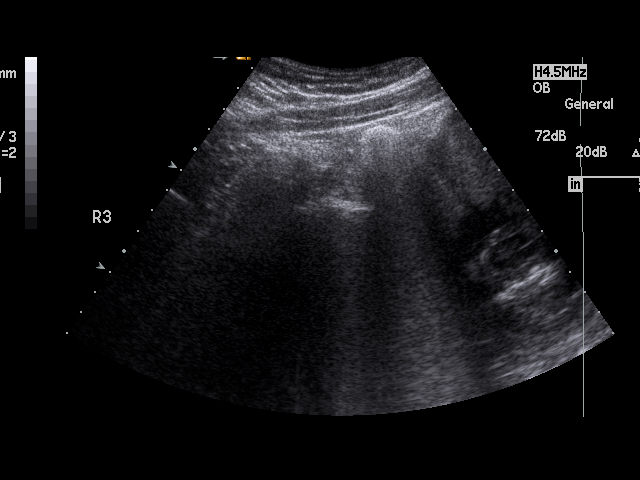
[im 13/87]
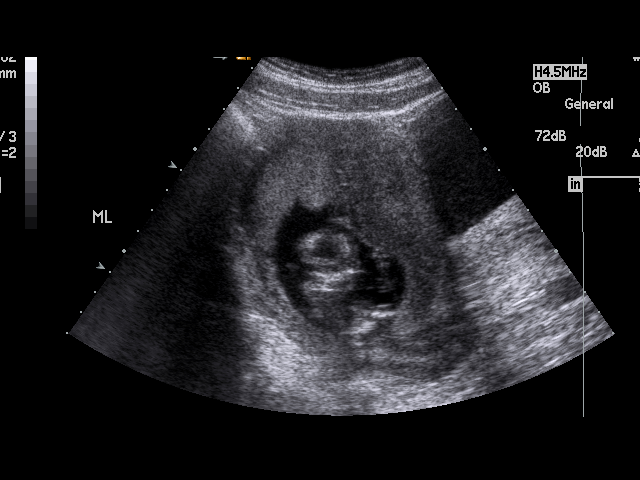
[im 16/87]
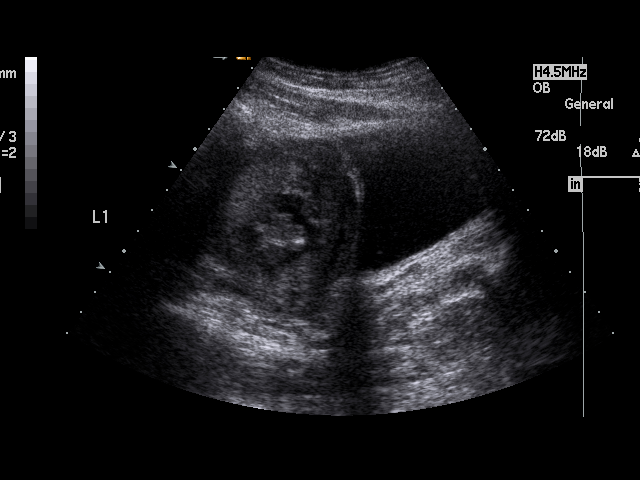
[im 23/87]
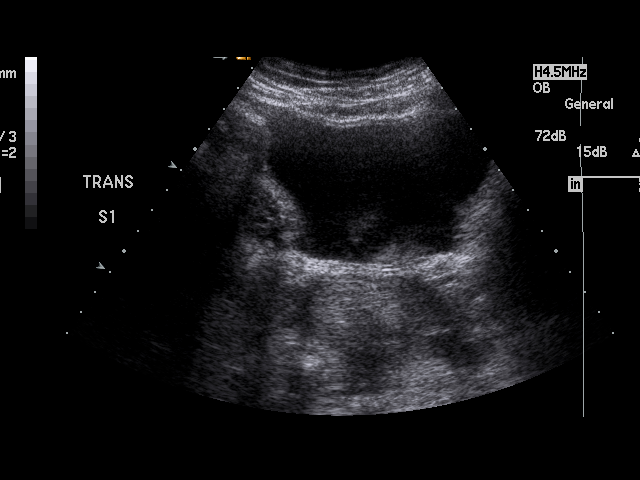
[im 29/87]
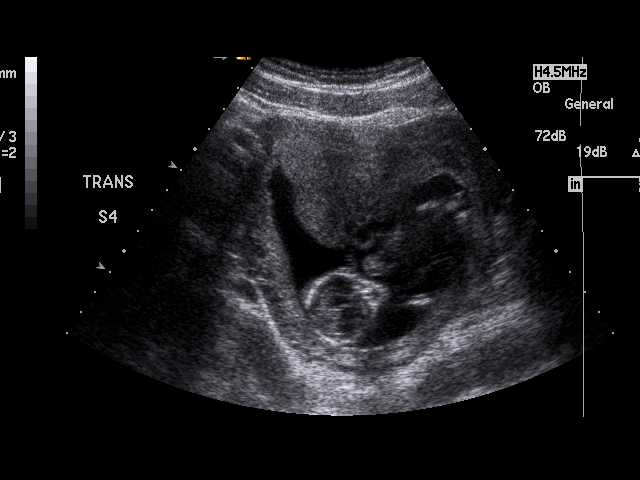
[im 32/87]
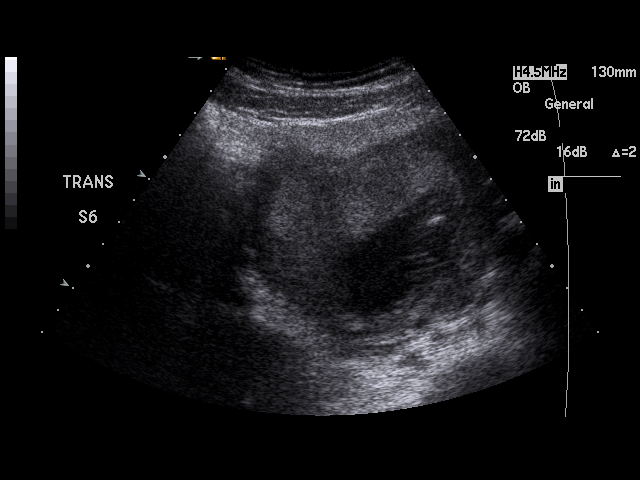
[im 39/87]
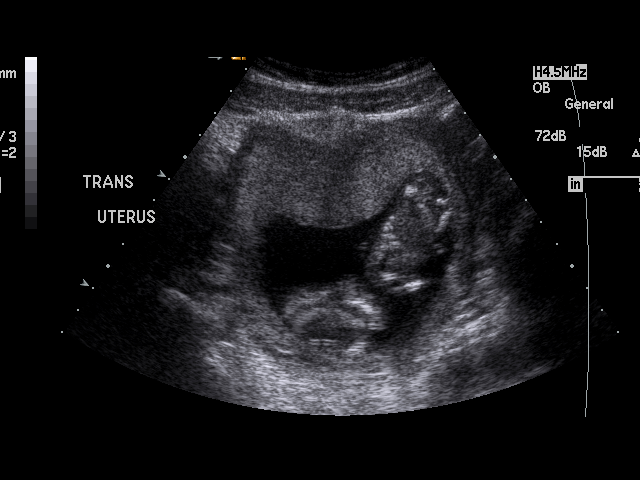
[im 45/87]
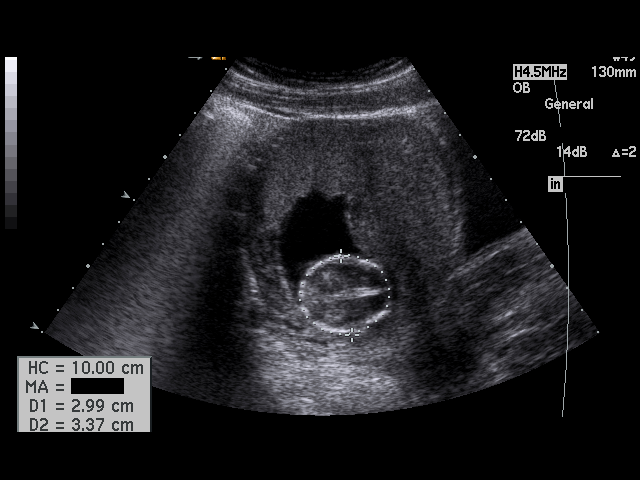
[im 48/87]
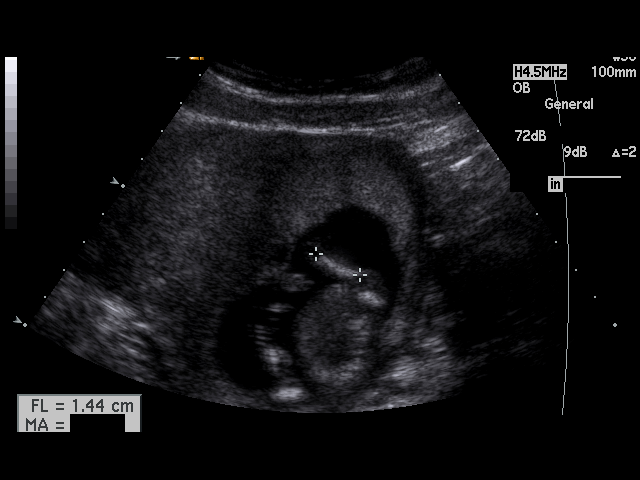
[im 55/87]
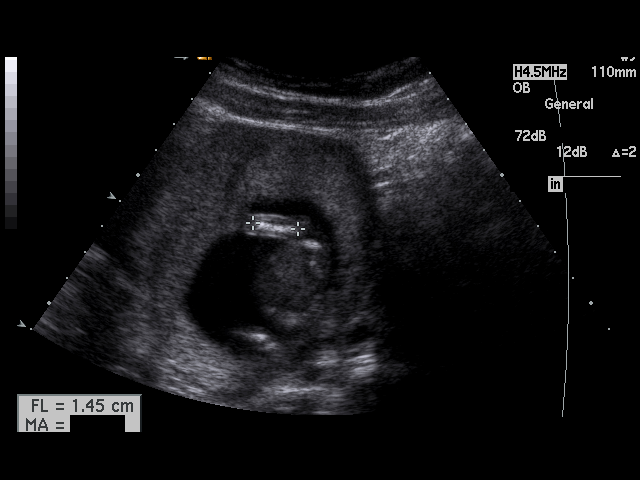
[im 58/87]
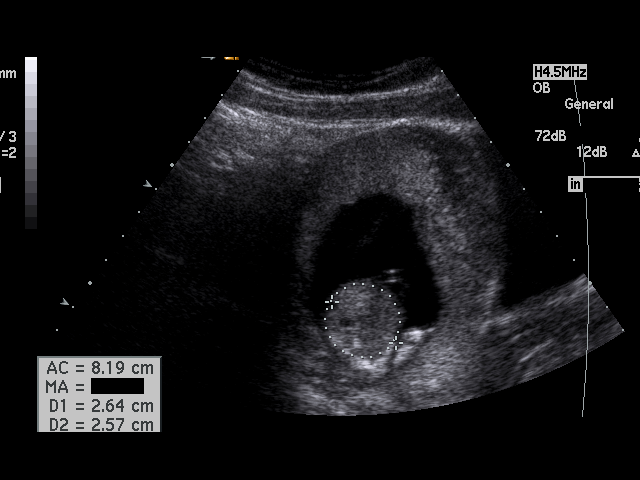
[im 64/87]
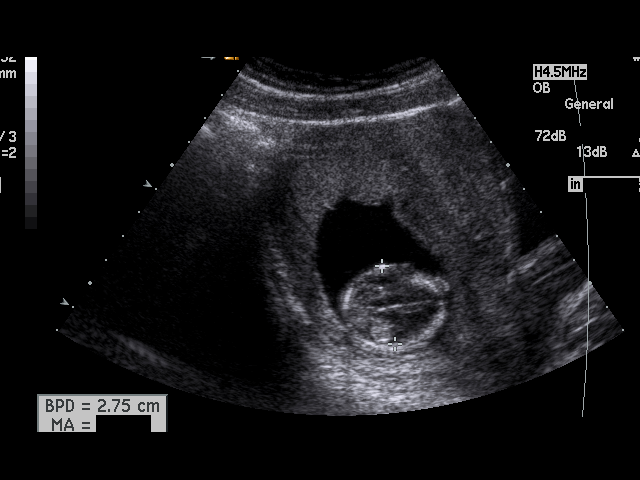
[im 71/87]
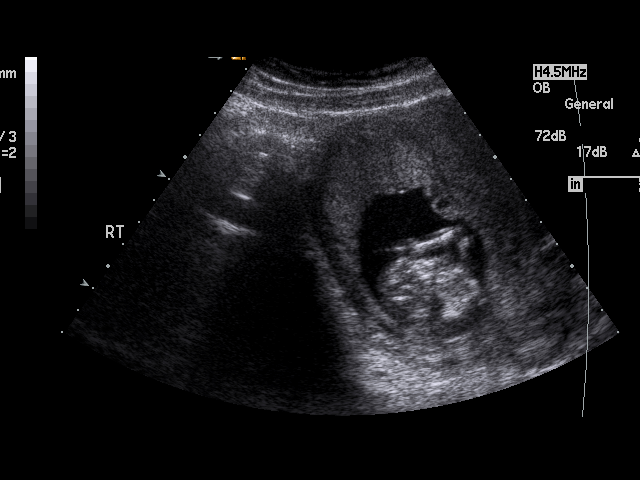
[im 74/87]
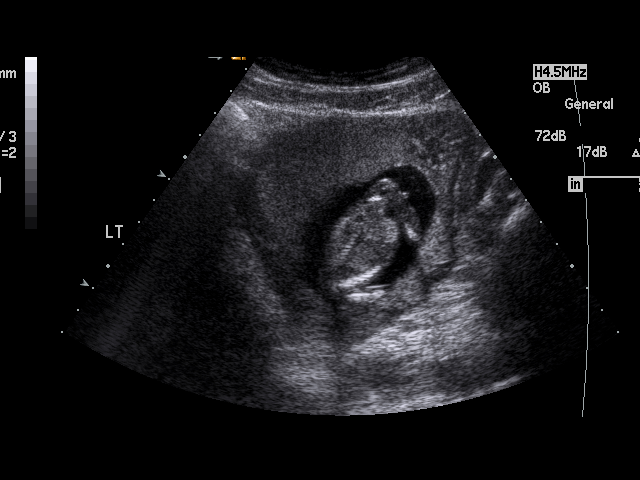
[im 80/87]
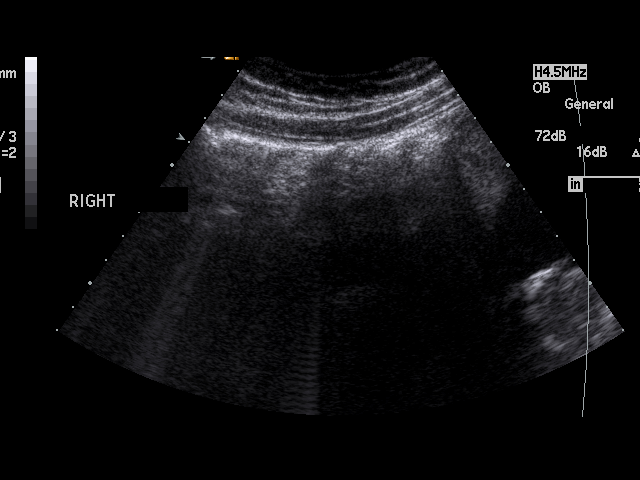
[im 87/87]
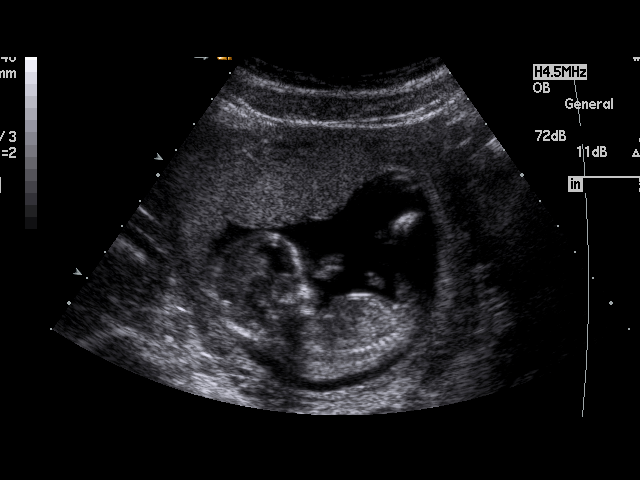

[17 of 28 positions shown; findings below may reference images not displayed]

PROCEDURE:     US  - US OB GREATER/OR EQUAL TO 6IKMG  - December 18, 2011  [DATE]

RESULT:     There is a gravid uterus present with variable fetal
presentation. The amniotic fluid volume is estimated to be normal. The
placenta is anterior. A fetal cardiac rate of 147 beats per minute was
demonstrated. No subchorionic hemorrhage was seen. The maternal left ovary
is normal in appearance. The maternal right ovary was not demonstrated.
Survey views of the maternal kidneys are normal in appearance.

Measured parameters:
BPD 2.75 cm corresponding to an EGA of 14 weeks 6 days
HC 10.18 cm corresponding to an EGA of 14 weeks 5 days
AC 8 cm corresponding to an EGA of 14 weeks 3 days
FL 1.44 cm corresponding to an EGA of 14 weeks 2 days
Estimated fetal weight 115 grams plus or minus 15 grams.
IMPRESSION: There is a viable IUP with estimated gestational age of 14
weeks 4 days plus or minus approximately 10 days. The estimated date of
confinement is 13 June, 2012. There is no evidence of a subchorionic
hemorrhage. Followup scanning at approximately 20 to 22 weeks is recommended
for biophysical evaluation.

A preliminary report was sent to the [HOSPITAL] the conclusion
of the study.

## 2014-03-23 ENCOUNTER — Emergency Department: Payer: Self-pay | Admitting: Emergency Medicine

## 2014-03-23 LAB — CBC
HCT: 42.4 % (ref 35.0–47.0)
HGB: 14 g/dL (ref 12.0–16.0)
MCH: 28.7 pg (ref 26.0–34.0)
MCHC: 33.2 g/dL (ref 32.0–36.0)
MCV: 87 fL (ref 80–100)
Platelet: 240 10*3/uL (ref 150–440)
RBC: 4.89 10*6/uL (ref 3.80–5.20)
RDW: 14.4 % (ref 11.5–14.5)
WBC: 11.7 10*3/uL — AB (ref 3.6–11.0)

## 2014-03-23 LAB — COMPREHENSIVE METABOLIC PANEL
ALBUMIN: 3.4 g/dL (ref 3.4–5.0)
ALK PHOS: 78 U/L
Anion Gap: 5 — ABNORMAL LOW (ref 7–16)
BILIRUBIN TOTAL: 0.3 mg/dL (ref 0.2–1.0)
BUN: 8 mg/dL (ref 7–18)
CALCIUM: 8.7 mg/dL (ref 8.5–10.1)
Chloride: 104 mmol/L (ref 98–107)
Co2: 27 mmol/L (ref 21–32)
Creatinine: 0.69 mg/dL (ref 0.60–1.30)
EGFR (African American): >60
Glucose: 82 mg/dL (ref 65–99)
Osmolality: 269 (ref 275–301)
Potassium: 3.4 mmol/L — ABNORMAL LOW (ref 3.5–5.1)
SGOT(AST): 11 U/L — ABNORMAL LOW (ref 15–37)
SGPT (ALT): 14 U/L (ref 12–78)
Sodium: 136 mmol/L (ref 136–145)
Total Protein: 7.3 g/dL (ref 6.4–8.2)

## 2014-03-23 LAB — URINALYSIS, COMPLETE
BILIRUBIN, UR: NEGATIVE
Blood: NEGATIVE
GLUCOSE, UR: NEGATIVE mg/dL (ref 0–75)
Ketone: NEGATIVE
NITRITE: NEGATIVE
PH: 7 (ref 4.5–8.0)
PROTEIN: NEGATIVE
Specific Gravity: 1.017 (ref 1.003–1.030)
Squamous Epithelial: 6
WBC UR: 113 /HPF (ref 0–5)

## 2014-03-23 LAB — WET PREP, GENITAL

## 2014-03-23 LAB — GC/CHLAMYDIA PROBE AMP

## 2014-03-23 LAB — LIPASE, BLOOD: Lipase: 99 U/L (ref 73–393)

## 2014-03-23 LAB — HCG, QUANTITATIVE, PREGNANCY: Beta Hcg, Quant.: 161512 m[IU]/mL — ABNORMAL HIGH

## 2014-05-28 ENCOUNTER — Emergency Department: Payer: Self-pay | Admitting: Emergency Medicine

## 2014-05-28 LAB — URINALYSIS, COMPLETE
BILIRUBIN, UR: NEGATIVE
Blood: NEGATIVE
Glucose,UR: NEGATIVE mg/dL (ref 0–75)
KETONE: NEGATIVE
Nitrite: POSITIVE
Ph: 5 (ref 4.5–8.0)
RBC,UR: 2 /HPF (ref 0–5)
SPECIFIC GRAVITY: 1.023 (ref 1.003–1.030)
Squamous Epithelial: 6

## 2014-05-28 LAB — BASIC METABOLIC PANEL
Anion Gap: 8 (ref 7–16)
BUN: 9 mg/dL (ref 7–18)
CHLORIDE: 107 mmol/L (ref 98–107)
CO2: 21 mmol/L (ref 21–32)
Calcium, Total: 8.8 mg/dL (ref 8.5–10.1)
Creatinine: 0.97 mg/dL (ref 0.60–1.30)
Glucose: 71 mg/dL (ref 65–99)
OSMOLALITY: 269 (ref 275–301)
Potassium: 4 mmol/L (ref 3.5–5.1)
SODIUM: 136 mmol/L (ref 136–145)

## 2014-05-28 LAB — CBC WITH DIFFERENTIAL/PLATELET
BASOS ABS: 0.1 10*3/uL (ref 0.0–0.1)
BASOS PCT: 0.7 %
EOS ABS: 0.2 10*3/uL (ref 0.0–0.7)
Eosinophil %: 2.7 %
HCT: 43.6 % (ref 35.0–47.0)
HGB: 14.2 g/dL (ref 12.0–16.0)
Lymphocyte #: 2.9 10*3/uL (ref 1.0–3.6)
Lymphocyte %: 32.9 %
MCH: 28.5 pg (ref 26.0–34.0)
MCHC: 32.5 g/dL (ref 32.0–36.0)
MCV: 88 fL (ref 80–100)
MONOS PCT: 5.4 %
Monocyte #: 0.5 x10 3/mm (ref 0.2–0.9)
Neutrophil #: 5.1 10*3/uL (ref 1.4–6.5)
Neutrophil %: 58.3 %
Platelet: 203 10*3/uL (ref 150–440)
RBC: 4.97 10*6/uL (ref 3.80–5.20)
RDW: 13.4 % (ref 11.5–14.5)
WBC: 8.8 10*3/uL (ref 3.6–11.0)

## 2014-06-01 LAB — URINE CULTURE

## 2014-08-06 ENCOUNTER — Encounter (HOSPITAL_COMMUNITY): Payer: Self-pay | Admitting: *Deleted

## 2014-08-06 ENCOUNTER — Emergency Department: Payer: Self-pay | Admitting: Emergency Medicine

## 2014-08-06 LAB — URINALYSIS, COMPLETE
Bilirubin,UR: NEGATIVE
Blood: NEGATIVE
Glucose,UR: NEGATIVE mg/dL (ref 0–75)
Ketone: NEGATIVE
Nitrite: NEGATIVE
PH: 6 (ref 4.5–8.0)
Protein: NEGATIVE
RBC,UR: 1 /HPF (ref 0–5)
SPECIFIC GRAVITY: 1.016 (ref 1.003–1.030)
Squamous Epithelial: 5
WBC UR: 19 /HPF (ref 0–5)

## 2014-08-06 LAB — WET PREP, GENITAL

## 2014-08-07 LAB — GC/CHLAMYDIA PROBE AMP

## 2015-02-12 NOTE — H&P (Signed)
L&D Evaluation:  History:   HPI 24 year [facility] G3 [facility]0020[facility] since patient has hx of irregular menses presents today at 4933 4/7 weeks with c/o nausea, back pain and decreased FM today. Last ate solid food last night for supper. Has had issues with morning nausea thru pregnancy-usually resolves with eating breakfast-but awoke late and went to work without eating-and her nausea worsened. Has not been able to eat anything today, then noted decreased FM. Had a bout of loose stools x1 last night. Complains of sacral back pain and terminal dysuria. [facility]no [facility] records available. Patient reports an ongoing problem with UTIs this pregnancy, including pyelo. She has been on numerous antibiotics including Macrobid, Keflex, Amoxicillin. Just completed a 7 day course of Macrobid and is currently taking Macrobid prophyllaxis. Patient denies any other problems this pregnancy.    Presents with back pain, dysuria, decreased fetal movement, nausea/vomiting    Patient's Medical History Recurrent UTIs, anxiety/depression (not a current problem)  Denies hx of urolithiasis    Patient's Surgical History none    Medications Pre Natal Vitamins  Macrobid qhs    Allergies NKDA    Family History Non-Contributory   ROS:   ROS see HPI   Exam:   Vital Signs stable  125/67    Urine Protein UA:100mg m/dl, 3+leuks, 15 RBC/HPF, 161/WRU345/HPF, +1bacteria, clumps of WBCs, 8epi cells.    General no apparent distress    Mental Status clear    Chest clear    Heart normal sinus rhythm, no murmur/gallop/rubs    Abdomen gravid, non-tender    Back no CVAT    Edema no edema    Mebranes Intact    FHT normal rate with no decels, baseline initially 120s, then 130s with multiple accels to 160s+    FHT Description moderate variability    Ucx uterine irritability initially, then absent    Skin dry    Other Given Zofran 8 mgm after arrival and has been able to keep down some fluids   since then.   Impression:   Impression IUP at 33 4/7 weeks with nausea-resolved. Decreased FM-reacrive NST. Probable UTI   Plan:   Plan urine C&S pending. Start Ceftin 250 mgm BID. Patient did not want to stay for IV antibiotics-wanted to go home on po antibiotics. Hold macrobid for now. Will call with lab results. Does have some Zofran at home if needed. Increase water intake.   Electronic Signatures: Trinna BalloonGutierrez, Doron Shake L (CNM)  (Signed 26-Jul-13 08:44)  Authored: L&D Evaluation   Last Updated: 26-Jul-13 08:44 by Trinna BalloonGutierrez, Kenda Kloehn L (CNM)

## 2015-04-02 ENCOUNTER — Ambulatory Visit
Admission: EM | Admit: 2015-04-02 | Discharge: 2015-04-02 | Disposition: A | Discharge status: Home / Self Care | Payer: Self-pay | Attending: Family Medicine | Admitting: Family Medicine

## 2015-04-02 DIAGNOSIS — R059 Cough, unspecified: Secondary | ICD-10-CM

## 2015-04-02 DIAGNOSIS — R05 Cough: Secondary | ICD-10-CM | POA: Insufficient documentation

## 2015-04-02 DIAGNOSIS — Z87891 Personal history of nicotine dependence: Secondary | ICD-10-CM | POA: Insufficient documentation

## 2015-04-02 DIAGNOSIS — B2790 Infectious mononucleosis, unspecified without complication: Secondary | ICD-10-CM | POA: Insufficient documentation

## 2015-04-02 LAB — RAPID STREP SCREEN (MED CTR MEBANE ONLY): Streptococcus, Group A Screen (Direct): NEGATIVE

## 2015-04-02 LAB — MONONUCLEOSIS SCREEN: Mono Screen: POSITIVE — AB

## 2015-04-02 MED ORDER — IBUPROFEN 800 MG PO TABS
800.0000 mg | ORAL_TABLET | Freq: Once | ORAL | Status: AC
  Administered 2015-04-02: 800 mg via ORAL

## 2015-04-02 MED ORDER — BENZONATATE 100 MG PO CAPS: 100.0000 mg | ORAL_CAPSULE | Freq: Three times a day (TID) | ORAL | Status: DC

## 2015-04-02 NOTE — ED Provider Notes (Signed)
CSN: [id number]     Arrival date & time 04/02/15  1714 History   First MD Initiated Contact with Patient 04/02/15 1740     Chief Complaint  Patient presents with  . Sore Throat   (Consider location/radiation/quality/duration/timing/severity/associated sxs/prior Treatment) HPI  24 yo F with scratchy throat, cough and malaise. Had a " bad cold" about 3 weeks ago and has been aggravated with a cough since. No hx seasonal  Allergies. Temps 99-100 last few days, treating with occasional  tylenol. Mild fever, no thermometer.Girlfriend questions  "Mono" .BC by condoms- unprotected x 1 about 3 days ago.   Hx of UTIs years ago-PC-not often now.   Took Emergen-C this morning to ward off this cold. Past Medical History  Diagnosis Date  . Anemia   . HSV-2 (herpes simplex virus 2) infection   . Trichomonas    Past Surgical History  Procedure Laterality Date  . Wisdom tooth extraction     Family History  Problem Relation Age of Onset  . Hypertension Father   . Diabetes Father   . COPD Sister     born with RSV has immune problems with lung infections  . Stroke Maternal Uncle   . Hypertension Paternal Aunt   . Hypertension Maternal Grandfather   . Cancer Maternal Grandfather     lung  . Cancer Paternal Grandfather     lung  . Hypertension Paternal Aunt    History  Substance Use Topics  . Smoking status: Former Games developer  . Smokeless tobacco: Never Used  . Alcohol Use: Yes     Comment: socially   OB History    Gravida Para Term Preterm AB TAB SAB Ectopic Multiple Living   Review of Systems Review of 10 systems negative for acute change except as referenced in HPI  Allergies  Review of patient's allergies indicates no known allergies.  Home Medications   Prior to Admission medications   Medication Sig Start Date End Date Taking? Authorizing Provider  acetaminophen (TYLENOL) 500 MG tablet Take 1,000 mg by mouth every 6 (six) hours as needed for pain or fever.    Yes Historical Provider, MD  benzonatate (TESSALON) 100 MG capsule Take 1 capsule (100 mg total) by mouth every 8 (eight) hours. 04/02/15   Rae Halsted, PA-C  cephALEXin (KEFLEX) 500 MG capsule Take 1 capsule (500 mg total) by mouth 4 (four) times daily. 02/20/13   Bethann Berkshire, MD  ciprofloxacin (CIPRO) 250 MG tablet Take 250 mg by mouth 2 (two) times daily.    Historical Provider, MD  promethazine (PHENERGAN) 25 MG tablet Take 1 tablet (25 mg total) by mouth every 6 (six) hours as needed for nausea. 02/20/13   Bethann Berkshire, MD   BP 142/90 mmHg  Pulse 104  Temp(Src) 98.8 F (37.1 C) (Tympanic)  Resp 16  Ht  (1.676 m)  Wt 140 lb (63.504 kg)  BMI 22.61 kg/m2  SpO2 100%  LMP 03/22/2015 (Approximate) Physical Exam Constitutional -alert and oriented,tired, scratchy throat Head-atraumatic Eyes- conjunctiva normal, EOMI ,conjugate gaze Nose- no congestion or rhinorrhea Mouth/throat- mucous membranes moist ,oropharynx erythematous, no exudate ; Stat Strep :NEG Neck- supple with positive ant cervical glandular enlargement, no posterior chain enlargement CV- tachy 104, grossly normal heart sounds,  Resp-no distress, normal respiratory effort,clear to auscultation bilaterally GI- soft,non-tender,no distention GU-  not examined MSK- no lower extremity tenderness nor edema,no joint effusion, ambulatory Neuro- normal  speech and language,  Skin-warm,dry ,intact; no rash noted Psych-mood and affect grossly normal;  ED Course  Procedures (including critical care time) Labs Review Labs Reviewed  MONONUCLEOSIS SCREEN - Abnormal; Notable for the following:    Mono Screen POSITIVE (*)    All other components within normal limits  RAPID STREP SCREEN (NOT AT [facility])  CULTURE, GROUP A STREP ([facility] ONLY)   Results for orders placed or performed during the hospital encounter of 04/02/15  Rapid strep screen  Result Value Ref Range   Streptococcus, Group A Screen (Direct) NEGATIVE NEGATIVE   Mononucleosis screen  Result Value Ref Range   Mono Screen POSITIVE (A) NEGATIVE    Results for orders placed or performed during the hospital encounter of 04/02/15  Rapid strep screen  Result Value Ref Range   Streptococcus, Group A Screen (Direct) NEGATIVE NEGATIVE  Mononucleosis screen  Result Value Ref Range   Mono Screen POSITIVE (A) NEGATIVE   Imaging Review No results found.   MDM   1. Cough   2. Mononucleosis     Discharge Medication List as of 04/02/2015  6:40 PM    START taking these medications   Details  benzonatate (TESSALON) 100 MG capsule Take 1 capsule (100 mg total) by mouth every 8 (eight) hours., Starting 04/02/2015, Until Discontinued, Normal       Diagnosis and treatment discussed.Informational handouts given. . Questions fielded, expectations and recommendations reviewed. Patient expresses understanding. [facility] questions, concern or exacerbation.     Rae HalstedLaurie W Lee, PA-C 04/02/15 1927

## 2015-04-02 NOTE — Discharge Instructions (Signed)
Cough, Adult  A cough is a reflex. It helps you clear your throat and airways. A cough can help heal your body. A cough can last 2 or 3 weeks (acute) or may last more than 8 weeks (chronic). Some common causes of a cough can include an infection, allergy, or a cold. HOME CARE  Only take medicine as told by your doctor.  If given, take your medicines (antibiotics) as told. Finish them even if you start to feel better.  Use a cold steam vaporizer or humidifier in your home. This can help loosen thick spit (secretions).  Sleep so you are almost sitting up (semi-upright). Use pillows to do this. This helps reduce coughing.  Rest as needed.  Stop smoking if you smoke. GET HELP RIGHT AWAY IF:  You have yellowish-white fluid (pus) in your thick spit.  Your cough gets worse.  Your medicine does not reduce coughing, and you are losing sleep.  You cough up blood.  You have trouble breathing.  Your pain gets worse and medicine does not help.  You have a fever. MAKE SURE YOU:   Understand these instructions.  Will watch your condition.  Will get help right away if you are not doing well or get worse. Document Released: 06/04/2011 Document Revised: 02/05/2014 Document Reviewed: 06/04/2011 [facility] Patient Information 2015 [place], [place]. This information is not intended to replace advice given to you by your health care provider. Make sure you discuss any questions you have with your health care provider.   Infectious Mononucleosis Infectious mononucleosis (mono) is a common germ (viral) infection in children, teenagers, and young adults.  CAUSES  Mono is an infection caused by the Malachi Carl virus. The virus is spread by close personal contact with someone who has the infection. It can be passed by contact with your saliva through things such as kissing or sharing drinking glasses. Sometimes, the infection can be spread from someone who does not appear sick but still spreads the  virus (asymptomatic carrier state).  SYMPTOMS  The most common symptoms of Mono are:  Sore throat.  Headache.  Fatigue.  Muscle aches.  Swollen glands.  Fever.  Poor appetite.  Enlarged liver or spleen. The less common symptoms can include:  Rash.  Feeling sick to your stomach (nauseous).  Abdominal pain. DIAGNOSIS  Mono is diagnosed by a blood test.  TREATMENT  Treatment of mono is usually at home. There is no medicine that cures this virus. Sometimes hospital treatment is needed in severe cases. Steroid medicine sometimes is needed if the swelling in the throat causes breathing or swallowing problems.  HOME CARE INSTRUCTIONS   Drink enough fluids to keep your urine clear or pale yellow.  Eat soft foods. Cool foods like popsicles or ice cream can soothe a sore throat.  Only take over-the-counter or prescription medicines for pain, discomfort, or fever as directed by your caregiver. Children under 17 years of age should not take aspirin.  Gargle salt water. This may help relieve your sore throat. Put 1 teaspoon (tsp) of salt in 1 cup of warm water. Sucking on hard candy may also help.  Rest as needed.  Start regular activities gradually after the fever is gone. Be sure to rest when tired.  Avoid strenuous exercise or contact sports until your caregiver says it is okay. The liver and spleen could be seriously injured.  Avoid sharing drinking glasses or kissing until your caregiver tells you that you are no longer contagious. SEEK MEDICAL CARE IF:  Your fever is not gone after 7 days.  Your activity level is not back to normal after 2 weeks.  You have yellow coloring to eyes and skin (jaundice). SEEK IMMEDIATE MEDICAL CARE IF:   You have severe pain in the abdomen or shoulder.  You have trouble swallowing or drooling.  You have trouble breathing.  You develop a stiff neck.  You develop a severe headache.  You cannot stop throwing up (vomiting).  You  have convulsions.  You are confused.  You have trouble with balance.  You develop signs of body fluid loss (dehydration):  Weakness.  Sunken eyes.  Pale skin.  Dry mouth.  Rapid breathing or pulse. MAKE SURE YOU:   Understand these instructions.  Will watch your condition.  Will get help right away if you are not doing well or get worse. Document Released: 09/18/2000 Document Revised: 12/14/2011 Document Reviewed: 07/17/2008 [facility] Patient Information 2015 [place]. This information is not intended to replace advice given to you by your health care provider. Make sure you discuss any questions you have with your health care provider.

## 2015-04-02 NOTE — ED Notes (Signed)
Started with sore throat and fever starting yesterday. Non productive cough x several weeks

## 2015-04-04 LAB — CULTURE, GROUP A STREP (THRC)

## 2015-04-04 MED ORDER — PENICILLIN V POTASSIUM 500 MG PO TABS: 500.0000 mg | ORAL_TABLET | Freq: Three times a day (TID) | ORAL | Status: DC

## 2015-04-06 NOTE — ED Notes (Signed)
Pt called this morning to inform clinic that the prescription that was [place] to [facility] [place] [facility] to [facility] [place] because [place] no longer lives in [place]. PCN Veetid 500 mg tablet tid for 10 days [facility] [place] this prescription was verified with Sol BlazingLau

## 2015-04-06 NOTE — ED Notes (Signed)
Pt called into get prescription that was [facility] to [facility] in [place] has asked for [facility] to be [facility] [place]. [facility] [place] this was verified with Anette RiedelLaurie Lee PA-C PCN V Potassium 500 mg tablet tid for 10 days.

## 2015-04-11 ENCOUNTER — Telehealth: Payer: Self-pay

## 2015-04-11 NOTE — ED Notes (Signed)
Pt called and request Diflucan for vaginal yeast infection that started as she is taking PCN. Verbal order from B Roemer PAC to call Diflucan  1 PO . Quantity 2 as she may repeat in 4-7 days if not improved. No Refills. Pt made aware of order.

## 2015-11-13 ENCOUNTER — Other Ambulatory Visit: Payer: Self-pay | Admitting: Family Medicine

## 2015-11-14 LAB — CMP12+LP+TP+TSH+6AC+CBC/D/PLT
A/G RATIO: 1.7 (ref 1.1–2.5)
ALK PHOS: 78 IU/L (ref 39–117)
ALT: 11 IU/L (ref 0–32)
AST: 12 IU/L (ref 0–40)
Albumin: 4.2 g/dL (ref 3.5–5.5)
BILIRUBIN TOTAL: 0.3 mg/dL (ref 0.0–1.2)
BUN/Creatinine Ratio: 14 (ref 8–20)
BUN: 10 mg/dL (ref 6–20)
Basophils Absolute: 0 10*3/uL (ref 0.0–0.2)
Basos: 0 %
CHOL/HDL RATIO: 2.8 ratio (ref 0.0–4.4)
CREATININE: 0.73 mg/dL (ref 0.57–1.00)
Calcium: 8.8 mg/dL (ref 8.7–10.2)
Chloride: 102 mmol/L (ref 96–106)
Cholesterol, Total: 146 mg/dL (ref 100–199)
EOS (ABSOLUTE): 0.1 10*3/uL (ref 0.0–0.4)
EOS: 2 %
Estimated CHD Risk: <0.5 times avg. (ref 0.0–1.0)
Free Thyroxine Index: 2.6 (ref 1.2–4.9)
GFR, EST AFRICAN AMERICAN: 134 mL/min/{1.73_m2} (ref >59)
GFR, EST NON AFRICAN AMERICAN: 116 mL/min/{1.73_m2} (ref >59)
GGT: 9 IU/L (ref 0–60)
GLUCOSE: 82 mg/dL (ref 65–99)
Globulin, Total: 2.5 g/dL (ref 1.5–4.5)
HDL: 52 mg/dL (ref >39)
HEMATOCRIT: 39.8 % (ref 34.0–46.6)
HEMOGLOBIN: 13.2 g/dL (ref 11.1–15.9)
IMMATURE GRANS (ABS): 0 10*3/uL (ref 0.0–0.1)
Immature Granulocytes: 0 %
Iron: 46 ug/dL (ref 27–159)
LDH: 162 IU/L (ref 119–226)
LDL CALC: 83 mg/dL (ref 0–99)
Lymphocytes Absolute: 1.8 10*3/uL (ref 0.7–3.1)
Lymphs: 31 %
MCH: 28.1 pg (ref 26.6–33.0)
MCHC: 33.2 g/dL (ref 31.5–35.7)
MCV: 85 fL (ref 79–97)
MONOCYTES: 7 %
Monocytes Absolute: 0.4 10*3/uL (ref 0.1–0.9)
NEUTROS ABS: 3.6 10*3/uL (ref 1.4–7.0)
Neutrophils: 60 %
POTASSIUM: 4.1 mmol/L (ref 3.5–5.2)
Phosphorus: 3.3 mg/dL (ref 2.5–4.5)
Platelets: 251 10*3/uL (ref 150–379)
RBC: 4.69 x10E6/uL (ref 3.77–5.28)
RDW: 13.3 % (ref 12.3–15.4)
Sodium: 141 mmol/L (ref 134–144)
T3 Uptake Ratio: 32 % (ref 24–39)
T4, Total: 8.1 ug/dL (ref 4.5–12.0)
TSH: 3.95 u[IU]/mL (ref 0.450–4.500)
Total Protein: 6.7 g/dL (ref 6.0–8.5)
Triglycerides: 54 mg/dL (ref 0–149)
URIC ACID: 4.2 mg/dL (ref 2.5–7.1)
VLDL CHOLESTEROL CAL: 11 mg/dL (ref 5–40)
WBC: 5.9 10*3/uL (ref 3.4–10.8)

## 2015-11-14 LAB — HEPATITIS B SURFACE ANTIBODY,QUALITATIVE: Hep B Surface Ab, Qual: NONREACTIVE

## 2016-02-24 ENCOUNTER — Ambulatory Visit: Payer: Self-pay | Admitting: Physician Assistant

## 2016-02-24 ENCOUNTER — Encounter: Payer: Self-pay | Admitting: Physician Assistant

## 2016-02-24 VITALS — BP 121/70 | HR 93 | Temp 97.9°F

## 2016-02-24 DIAGNOSIS — J069 Acute upper respiratory infection, unspecified: Secondary | ICD-10-CM

## 2016-02-24 DIAGNOSIS — N390 Urinary tract infection, site not specified: Secondary | ICD-10-CM

## 2016-02-24 LAB — POCT URINALYSIS DIPSTICK
Bilirubin, UA: NEGATIVE
Blood, UA: NEGATIVE
GLUCOSE UA: NEGATIVE
Ketones, UA: NEGATIVE
NITRITE UA: NEGATIVE
Protein, UA: NEGATIVE
Spec Grav, UA: 1.015
UROBILINOGEN UA: 0.2
pH, UA: 7

## 2016-02-24 MED ORDER — BENZONATATE 200 MG PO CAPS: 200.0000 mg | ORAL_CAPSULE | Freq: Three times a day (TID) | ORAL | Status: DC | PRN

## 2016-02-24 MED ORDER — FLUTICASONE PROPIONATE 50 MCG/ACT NA SUSP: 2.0000 | Freq: Every day | NASAL | Status: DC

## 2016-02-24 MED ORDER — SULFAMETHOXAZOLE-TRIMETHOPRIM 800-160 MG PO TABS
1.0000 | ORAL_TABLET | Freq: Two times a day (BID) | ORAL | Status: DC
Start: 2016-02-24 — End: 2017-04-01

## 2016-02-24 NOTE — Progress Notes (Signed)
S: C/o runny nose and congestion for 5 days, no fever, chills, cp/sob, v/d; mucus was green , cough is sporadic, child had same sx, also thinks she has another uti, has been chronic lately  Using otc meds: robitussin  O: PE: vitals wnl, nad, perrl eomi, normocephalic, tms dull, nasal mucosa red and swollen, throat injected, neck supple no lymph, lungs c t a, cv rrr, neuro intact, cough is congested, ua 1+ leuks  A:  Acute uri, chronic uti   P: septra ds 1 po bid x 10d, flonase, tessalon , urine culture, drink fluids, continue regular meds , use otc meds of choice, return if not improving in 5 days, return earlier if worsening

## 2016-03-04 LAB — URINE CULTURE

## 2016-08-24 ENCOUNTER — Encounter: Payer: Self-pay | Admitting: Physician Assistant

## 2016-08-24 ENCOUNTER — Ambulatory Visit: Payer: Self-pay | Admitting: Family

## 2016-08-24 ENCOUNTER — Ambulatory Visit
Admission: RE | Admit: 2016-08-24 | Discharge: 2016-08-24 | Disposition: A | Discharge status: Home / Self Care | Payer: 59 | Source: Ambulatory Visit | Attending: Family | Admitting: Family

## 2016-08-24 VITALS — BP 130/79 | HR 76 | Temp 98.7°F

## 2016-08-24 DIAGNOSIS — G4452 New daily persistent headache (NDPH): Secondary | ICD-10-CM

## 2016-08-24 MED ORDER — SUMATRIPTAN SUCCINATE 100 MG PO TABS: 100.0000 mg | ORAL_TABLET | Freq: Once | ORAL | 0 refills | Status: DC

## 2016-08-24 MED ORDER — BUTALBITAL-APAP-CAFF-COD 50-325-40-30 MG PO CAPS: 2.0000 | ORAL_CAPSULE | ORAL | 0 refills | Status: DC | PRN

## 2016-08-24 NOTE — Progress Notes (Signed)
S/ 25 y/o [place] [place]  [place] the [facility],  C/c 11 day h/o left temporal HA, unrelenting , partially controlled with analgesics around the clock, waking her at hs, assoc visual sxs L eye, " gold glittery " , dizziness and vertigo,DENIES  N,V, trauma , weakness, numbness. Neg birth control, abstinence No prior hx of migraines ; + stress + fam hx : sib with recent dx migraines, uncle with cluster ha's O/ VSS , alert , tearful,  Alert oriented x 3 WD,WN,  Eyes PERRLA, EOMS Full, visual fields intact Face R temple area tender to Palpation,below temporal artery without redness or swelling  Neuro normal x  Absent left bicep not elicited , otherwise full & =, strength intact, RHOmberg neg, gait normal, + tip toes, heel walk and tandem  A/ Headache new onset , severe with neuro signs  Rule out acute process  ? Opthalmic migraine  P/ refer for stat CT. If neg will offer toradol shot and migraine med trial Will follow up in one week. If sxs worsening advised to seek emergent care.   CT neg for acute process. Pt declined toredol shot. rx imitrex , fioricet with instructions.  oow x 2 days. Hydration encouraged. F/u as above.

## 2016-08-31 ENCOUNTER — Ambulatory Visit: Payer: Self-pay | Admitting: Physician Assistant

## 2017-01-18 ENCOUNTER — Ambulatory Visit: Payer: Self-pay | Admitting: Physician Assistant

## 2017-01-18 ENCOUNTER — Encounter: Payer: Self-pay | Admitting: Physician Assistant

## 2017-01-18 VITALS — BP 102/79 | HR 79 | Temp 98.5°F

## 2017-01-18 DIAGNOSIS — R300 Dysuria: Secondary | ICD-10-CM

## 2017-01-18 DIAGNOSIS — N390 Urinary tract infection, site not specified: Secondary | ICD-10-CM

## 2017-01-18 DIAGNOSIS — R319 Hematuria, unspecified: Secondary | ICD-10-CM

## 2017-01-18 DIAGNOSIS — R112 Nausea with vomiting, unspecified: Secondary | ICD-10-CM

## 2017-01-18 LAB — POCT URINALYSIS DIPSTICK
BILIRUBIN UA: NEGATIVE
GLUCOSE UA: NEGATIVE
Ketones, UA: NEGATIVE
SPEC GRAV UA: 1.025 (ref 1.010–1.025)
UROBILINOGEN UA: 0.2 U/dL
pH, UA: 6 (ref 5.0–8.0)

## 2017-01-18 LAB — POCT URINE PREGNANCY: Preg Test, Ur: NEGATIVE

## 2017-01-18 MED ORDER — ONDANSETRON HCL 4 MG PO TABS: 4.0000 mg | ORAL_TABLET | Freq: Three times a day (TID) | ORAL | 0 refills | Status: DC | PRN

## 2017-01-18 MED ORDER — CIPROFLOXACIN HCL 250 MG PO TABS: 250.0000 mg | ORAL_TABLET | Freq: Two times a day (BID) | ORAL | 0 refills | Status: DC

## 2017-01-18 MED ORDER — FLUCONAZOLE 150 MG PO TABS: 150.0000 mg | ORAL_TABLET | Freq: Once | ORAL | 0 refills | Status: AC

## 2017-01-18 NOTE — Progress Notes (Signed)
S:  Pt c/o vomiting and diarrhea, sx for 1 day on Friday, no fever/chills, no abd pain except for cramping with diarrhea; denies cp/sob, denies camping, bad food, recent antibiotics, or exposure to bad water; didn't know if it was food poisoning bc she ate at [employer] 2 hours prior, states she is still nauseated, having uti sx, no vaginal discharge, states long hx of utis since she hit puberty, multiple antibiotics but it never goes away, has not been evaluated by urology Remainder ros neg  O:  Vitals wnl, nad, ENT wnl, neck supple no lymph, lungs c t a, cv rrr, abd soft nontender bs increased lower quads b/l, neuro intact  A:  Viral gastroenteritis, uti  P:  Reassurance, fluids, brat diet,zofran for nausea, cipro  bid x 10d, diflucan if yeast, return if not better in 3 days, return earlier if worsening, will culture urine, refer to urology

## 2017-01-22 LAB — URINE CULTURE

## 2017-04-01 ENCOUNTER — Ambulatory Visit: Payer: Self-pay | Admitting: Physician Assistant

## 2017-04-01 ENCOUNTER — Encounter: Payer: Self-pay | Admitting: Physician Assistant

## 2017-04-01 VITALS — BP 119/68 | HR 67 | Temp 98.0°F | Resp 16 | Ht 65.0 in | Wt 178.0 lb

## 2017-04-01 DIAGNOSIS — Z0000 Encounter for general adult medical examination without abnormal findings: Secondary | ICD-10-CM

## 2017-04-01 DIAGNOSIS — N390 Urinary tract infection, site not specified: Secondary | ICD-10-CM

## 2017-04-01 DIAGNOSIS — R319 Hematuria, unspecified: Principal | ICD-10-CM

## 2017-04-01 LAB — POCT URINALYSIS DIPSTICK
Bilirubin, UA: NEGATIVE
Glucose, UA: NEGATIVE
KETONES UA: NEGATIVE
Nitrite, UA: POSITIVE
Protein, UA: NEGATIVE
Spec Grav, UA: 1.02 (ref 1.010–1.025)
Urobilinogen, UA: 0.2 E.U./dL
pH, UA: 6 (ref 5.0–8.0)

## 2017-04-01 MED ORDER — CEFDINIR 300 MG PO CAPS
300.0000 mg | ORAL_CAPSULE | Freq: Two times a day (BID) | ORAL | 0 refills | Status: DC
Start: 2017-04-01 — End: 2017-09-24

## 2017-04-01 MED ORDER — CEFDINIR 300 MG PO CAPS: 300.0000 mg | ORAL_CAPSULE | Freq: Two times a day (BID) | ORAL | 0 refills | Status: DC

## 2017-04-01 NOTE — Progress Notes (Signed)
S: pt here for wellness physical had biometrics for insurance purposes done at work, was told her glucose and cholesterol were normal, is still having difficulty with utis, chronic, no other complaints ros neg. PMH:   neg Social: nonsmoker, social etoh, no drugs Fam: as noted on chart  O: vitals wnl, nad, ENT wnl, neck supple no lymph, lungs c t a, cv rrr, abd soft nontender bs normal all 4 quads, ua + leuks, nitrites, and blood  A: wellness physical, uti  P: omnicef 300mg  bid, will refer to urology

## 2017-04-01 NOTE — Addendum Note (Signed)
Addended by: Catha BrowEACON, MONIQUE T on: 04/01/2017 03:20 PM   Modules accepted: Orders

## 2017-04-05 LAB — URINE CULTURE

## 2017-04-13 NOTE — Progress Notes (Signed)
[facility].  Patient's appointment is 05/13/2017 at 10:45.  Patient was advised that if she develops any uti sx before her appointment she is to come to the [facility] for treatment.  Patient expressed understanding. Patient has also accepted the appointment.

## 2017-09-24 ENCOUNTER — Other Ambulatory Visit: Payer: Self-pay

## 2017-09-24 ENCOUNTER — Ambulatory Visit
Admission: EM | Admit: 2017-09-24 | Discharge: 2017-09-24 | Disposition: A | Discharge status: Home / Self Care | Payer: Managed Care, Other (non HMO) | Attending: Family | Admitting: Family

## 2017-09-24 DIAGNOSIS — N200 Calculus of kidney: Secondary | ICD-10-CM | POA: Diagnosis not present

## 2017-09-24 DIAGNOSIS — R1030 Lower abdominal pain, unspecified: Secondary | ICD-10-CM

## 2017-09-24 DIAGNOSIS — R102 Pelvic and perineal pain: Secondary | ICD-10-CM

## 2017-09-24 DIAGNOSIS — N390 Urinary tract infection, site not specified: Secondary | ICD-10-CM | POA: Diagnosis not present

## 2017-09-24 LAB — URINALYSIS, COMPLETE (UACMP) WITH MICROSCOPIC
Bilirubin Urine: NEGATIVE
GLUCOSE, UA: NEGATIVE mg/dL
KETONES UR: NEGATIVE mg/dL
Nitrite: POSITIVE — AB
PROTEIN: NEGATIVE mg/dL
Specific Gravity, Urine: 1.02 (ref 1.005–1.030)
pH: 6.5 (ref 5.0–8.0)

## 2017-09-24 MED ORDER — OMEPRAZOLE 20 MG PO CPDR: 20.0000 mg | DELAYED_RELEASE_CAPSULE | Freq: Every day | ORAL | 6 refills | Status: DC

## 2017-09-24 MED ORDER — HYDROCODONE-ACETAMINOPHEN 5-325 MG PO TABS: 1.0000 | ORAL_TABLET | Freq: Four times a day (QID) | ORAL | 0 refills | Status: DC | PRN

## 2017-09-24 MED ORDER — NAPROXEN 500 MG PO TABS: 500.0000 mg | ORAL_TABLET | Freq: Two times a day (BID) | ORAL | 0 refills | Status: DC

## 2017-09-24 MED ORDER — SULFAMETHOXAZOLE-TRIMETHOPRIM 800-160 MG PO TABS: 1.0000 | ORAL_TABLET | Freq: Two times a day (BID) | ORAL | 0 refills | Status: AC

## 2017-09-24 NOTE — Discharge Instructions (Signed)
-  Naproxen: one tablet twice a day for pain. Do not take other NSAIDS (ibuprofen) while taking this medication -can continue acetaminophen for pain. -omeperazole: one tablet daily while taking frequent NSAIDs. Also available over the counter, can also use Nexium. -Norco 5/325: one tablet every 6 hours as needed for pain.  -watch totals of acetaminophen as also in the Norco. No more than 4 grams per 24 hours -will also provide a urine strainer to catch possible stone.  -follow up with urologist

## 2017-09-24 NOTE — ED Provider Notes (Signed)
no [facility]    CSN: [id number] Arrival date & time: 09/24/17  1721    History   Chief Complaint Chief Complaint  Patient presents with  . Abdominal Pain    HPI Kylie Martin is a 26 y.o. female.   Patient is a 26 year old female who presents with complaint of abdominal pain and UTI. She states that she is taken 800 mg dose of ibuprofen as well as 1 g doses of acetaminophen that she alternates. States she had a fever today of 100.3. She said she's busy had frequent low-grade temperatures. Patient has a history of chronic UTI and is currently on daily Macrobid. She is being followed by urology at [facility]. Patient's last urine culture from August showed MRSA with previous ones being MSSA and Escherichia coli. She did have a CT scan done in August of this year that showed a left lower pole renal stone. She's had stones in the past and states this pain is similar but normally the stones have pain associated with the back of the sides. She states she is drinking lots of water. 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     Past Medical History:  Diagnosis Date  . Anemia   . HSV-2 (herpes simplex virus 2) infection   . Trichomonas     There are no active problems to display for this patient.   Past Surgical History:  Procedure Laterality Date  . WISDOM TOOTH EXTRACTION      OB History    Gravida Para Term Preterm AB Living   3 1 1   2 1    SAB TAB Ectopic Multiple Live Births   2       1       Home Medications    Prior to Admission medications   Medication Sig Start Date End Date Taking? Authorizing Provider  Butalbital-APAP-Caff-Cod (FIORICET/CODEINE) 50-300-40-30 MG CAPS Take by mouth.   Yes [provider]  nitrofurantoin, macrocrystal-monohydrate, (MACROBID) 100 MG capsule Take 100 mg by mouth 2 (two) times daily.   Yes [provider]    HYDROcodone-acetaminophen (NORCO) 5-325 MG tablet Take 1 tablet by mouth every 6 (six) hours as needed for moderate pain. 09/24/17   Candis Schatz, PA-C  naproxen (NAPROSYN) 500 MG tablet Take 1 tablet (500 mg total) by mouth 2 (two) times daily. 09/24/17   Candis Schatz, PA-C  omeprazole (PRILOSEC) 20 MG capsule Take 1 capsule (20 mg total) by mouth daily. 09/24/17   Candis Schatz, PA-C  sulfamethoxazole-trimethoprim (BACTRIM DS,SEPTRA DS) 800-160 MG tablet Take 1 tablet by mouth 2 (two) times daily for 7 days. 09/24/17 10/01/17  Candis Schatz, PA-C    Family History Family History  Problem Relation Age of Onset  . Hypertension Father   . Diabetes Father   . COPD Sister        born with RSV has immune problems with lung infections  . Stroke Maternal Uncle   . Hypertension Paternal Aunt   . Hypertension Maternal Grandfather   . Cancer Maternal Grandfather        lung  . Cancer Paternal Grandfather        lung  . Hypertension Paternal Aunt   . Cancer Maternal Grandmother        lung cancer  . Stroke Paternal Grandmother     Social History Social History   Tobacco Use  .  Smoking status: Former Games developermoker  . Smokeless tobacco: Never Used  Substance Use Topics  . Alcohol use: Yes    Comment: socially  . Drug use: No     Allergies   Penicillins   Review of Systems Review of Systems  As in above history of present illness. Other systems reviewed and found to be negative.   Physical Exam Triage Vital Signs ED Triage Vitals  Enc Vitals Group     BP 09/24/17 1741 123/76     Pulse Rate 09/24/17 1741 76     Resp 09/24/17 1741 18     Temp 09/24/17 1741 98.1 F (36.7 C)     Temp Source 09/24/17 1741 Oral     SpO2 09/24/17 1741 100 %     Weight 09/24/17 1738 180 lb (81.6 kg)     Height 09/24/17 1738 5\' 6"  (1.676 m)     Head Circumference --      Peak Flow --      Pain Score 09/24/17 1738 5     Pain Loc --      Pain Edu? --      Excl. in GC? --    No  data found.  Updated Vital Signs BP 123/76 (BP Location: Left Arm)   Pulse 76   Temp 98.1 F (36.7 C) (Oral)   Resp 18   Ht 5\' 6"  (1.676 m)   Wt 180 lb (81.6 kg)   LMP 09/14/2017   SpO2 100%   BMI 29.05 kg/m   Physical Exam  Constitutional: She appears well-nourished. She does not appear ill.  HENT:  Head: Normocephalic and atraumatic.  Eyes: EOM are normal. Pupils are equal, round, and reactive to light.  Cardiovascular: Normal rate and regular rhythm.  Pulmonary/Chest: Effort normal. No respiratory distress.  Abdominal: Soft. Normal appearance and bowel sounds are normal. There is tenderness in the suprapubic area. There is guarding (suprapubic with palpation) and CVA tenderness (right).  Skin: Skin is warm and dry.     UC Treatments / Results  Labs (all labs ordered are listed, but only abnormal results are displayed) Labs Reviewed  URINALYSIS, COMPLETE (UACMP) WITH MICROSCOPIC    EKG  EKG Interpretation None       Radiology No results found.  Procedures Procedures (including critical care time)  Medications Ordered in UC Medications - No data to display   Initial Impression / Assessment and Plan / UC Course  I have reviewed the triage vital signs and the nursing notes.  Pertinent labs & imaging results that were available during my care of the patient were reviewed by me and considered in my medical decision making (see chart for details).    Patient with chronic UTI on daily Macrobid therapy, followed by urology. CT back in August showing a left lower pole renal calculi plus several other areas of calcification. Will check a urinalysis. Differential includes polynephritis, acute cystitis response to her chronic UTI, as well as a renal calculi. Her most recent MRSA UTI was susceptible to oxacillin, Doxy Cycrin, gentamicin, Macrobid, and Bactrim. She did have a earlier UTI that was resistant to penicillin.  Final Clinical Impressions(s) / UC Diagnoses    Final diagnoses:  Suprapubic pain  Chronic UTI  Kidney stone on left side   Urinalysis with blood noted, many bacteria, and ABCs too numerous to count. We'll go ahead and and give her prescription for Bactrim to cover in regards to her previous culture. Continue Macrobid. Give her prescription for Naprosyn  500 mg twice a day. Can continue acetaminophen. Also give her prescription for omeprazole or advised her she can take an over-the-counter similar medication. Also give her Norco 5/325 #20 for pain not relieved by the other medications. Advised her to caution her acetaminophen intake. Will have her follow-up with her urologist. Patient also be given a strainer to catch a possible stones.  ED Discharge Orders        Ordered    omeprazole (PRILOSEC) 20 MG capsule  Daily     09/24/17 1816    naproxen (NAPROSYN) 500 MG tablet  2 times daily     09/24/17 1816    HYDROcodone-acetaminophen (NORCO) 5-325 MG tablet  Every 6 hours PRN     09/24/17 1816    sulfamethoxazole-trimethoprim (BACTRIM DS,SEPTRA DS) 800-160 MG tablet  2 times daily     09/24/17 1816       Controlled Substance Prescriptions [place] Controlled Substance Registry consulted? Yes, I have consulted the [place] Controlled Substances Registry for this patient, and feel the risk/benefit ratio today is favorable for proceeding with this prescription for a controlled substance.   Candis SchatzHarris, Michael D, PA-C 09/24/17 Claria [id number]

## 2017-09-24 NOTE — ED Triage Notes (Signed)
Patient complains of abdominal pain and fever that started yesterday. Patient states that the pain is lower abdomen and shoots down her legs at time. Patient states that she had similar pain last week when she was on her menstrual cycle. Patient reports that she has a continuous UTI that she takes Macrobid daily for.

## 2017-09-28 LAB — URINE CULTURE

## 2017-11-01 ENCOUNTER — Other Ambulatory Visit: Payer: Self-pay

## 2017-11-01 ENCOUNTER — Ambulatory Visit
Admission: EM | Admit: 2017-11-01 | Discharge: 2017-11-01 | Disposition: A | Discharge status: Home / Self Care | Payer: Managed Care, Other (non HMO) | Attending: Family Medicine | Admitting: Family Medicine

## 2017-11-01 DIAGNOSIS — Z113 Encounter for screening for infections with a predominantly sexual mode of transmission: Secondary | ICD-10-CM | POA: Diagnosis not present

## 2017-11-01 DIAGNOSIS — N898 Other specified noninflammatory disorders of vagina: Secondary | ICD-10-CM

## 2017-11-01 DIAGNOSIS — N390 Urinary tract infection, site not specified: Secondary | ICD-10-CM | POA: Diagnosis not present

## 2017-11-01 DIAGNOSIS — R319 Hematuria, unspecified: Secondary | ICD-10-CM | POA: Diagnosis not present

## 2017-11-01 DIAGNOSIS — Z3202 Encounter for pregnancy test, result negative: Secondary | ICD-10-CM | POA: Diagnosis not present

## 2017-11-01 DIAGNOSIS — R350 Frequency of micturition: Secondary | ICD-10-CM

## 2017-11-01 DIAGNOSIS — B9689 Other specified bacterial agents as the cause of diseases classified elsewhere: Secondary | ICD-10-CM | POA: Diagnosis not present

## 2017-11-01 DIAGNOSIS — N760 Acute vaginitis: Secondary | ICD-10-CM

## 2017-11-01 LAB — URINALYSIS, COMPLETE (UACMP) WITH MICROSCOPIC
Bilirubin Urine: NEGATIVE
Glucose, UA: NEGATIVE mg/dL
KETONES UR: NEGATIVE mg/dL
NITRITE: POSITIVE — AB
Protein, ur: NEGATIVE mg/dL
Specific Gravity, Urine: 1.02 (ref 1.005–1.030)
pH: 7.5 (ref 5.0–8.0)

## 2017-11-01 LAB — WET PREP, GENITAL
Sperm: NONE SEEN
Trich, Wet Prep: NONE SEEN
Yeast Wet Prep HPF POC: NONE SEEN

## 2017-11-01 LAB — PREGNANCY, URINE: Preg Test, Ur: NEGATIVE

## 2017-11-01 MED ORDER — METRONIDAZOLE 500 MG PO TABS: 500.0000 mg | ORAL_TABLET | Freq: Two times a day (BID) | ORAL | 0 refills | Status: DC

## 2017-11-01 MED ORDER — CEPHALEXIN 500 MG PO CAPS: 500.0000 mg | ORAL_CAPSULE | Freq: Two times a day (BID) | ORAL | 0 refills | Status: AC

## 2017-11-01 MED ORDER — FLUCONAZOLE 150 MG PO TABS: 150.0000 mg | ORAL_TABLET | Freq: Every day | ORAL | 0 refills | Status: DC

## 2017-11-01 NOTE — Discharge Instructions (Signed)
Take medication as prescribed. Rest. Drink plenty of fluids.   Follow up with your primary care physician or urology.  Return to Urgent care for new or worsening concerns.   

## 2017-11-01 NOTE — ED Provider Notes (Signed)
[facility] ____________________________________________  Time seen: Approximately 8:51 PM  I have reviewed the triage vital signs and the nursing notes.   HISTORY  Chief Complaint Vaginal Discharge   HPI Kylie Martin is a 27 y.o. female with past medical history of chronic UTIs, pyelonephritis and renal nephrotic nephrolithiasis, presenting for evaluation of 3 days of vaginal irritation, itching and thick whitish discharge.  Also reports she chronically has urinary frequency, and reports she has had increased urinary frequency, urgency and slight burning with urination over the last several days.  Denies accompanying flank pain, atypical back pain fevers, nausea, vomiting or diarrhea.  Reports sexually active with same partner, declines recent sexual partner changes.  States did have unprotected sex with her same partner this past Friday, took morning after pill the next day, and states later that day she noticed more vaginal discharge and irritation.  States not highly concerned of STDs, but would like to have gonorrhea and Chlamydia testing, declines other STD testing.  Denies vaginal rash or lesions.  States some suprapubic discomfort but denies vaginal pain.  States follows with urology due to chronic UTIs, states was on chronic Macrobid for suppression therapy, but continued to get intermittent UTIs mostly treated with Bactrim.  States no longer on suppressive therapy that was ended at the end of December 2018 and reports last UTI was treated with Bactrim at the end of December. Denies chest pain, shortness of breath, abdominal pain  or rash. Denies recent sickness. Denies recent antibiotic use.   Patient's last menstrual period was 10/12/2016.  Urology: at [facility]  Past Medical History:  Diagnosis Date  . Anemia   . HSV-2 (herpes simplex virus 2) infection   . Trichomonas     There are no active problems to display for this patient.   Past Surgical History:  Procedure  Laterality Date  . WISDOM TOOTH EXTRACTION       No current facility-administered medications for this encounter.   Current Outpatient Medications:  .  Butalbital-APAP-Caff-Cod (FIORICET/CODEINE) 50-300-40-30 MG CAPS, Take by mouth., Disp: , Rfl:  .  cephALEXin (KEFLEX) 500 MG capsule, Take 1 capsule (500 mg total) by mouth 2 (two) times daily for 7 days., Disp: 14 capsule, Rfl: 0 .  fluconazole (DIFLUCAN) 150 MG tablet, Take 1 tablet (150 mg total) by mouth daily. Take one pill orally, then Repeat in one week as needed., Disp: 2 tablet, Rfl: 0 .  metroNIDAZOLE (FLAGYL) 500 MG tablet, Take 1 tablet (500 mg total) by mouth 2 (two) times daily., Disp: 14 tablet, Rfl: 0  Allergies Penicillins  Family History  Problem Relation Age of Onset  . Hypertension Father   . Diabetes Father   . COPD Sister        born with RSV has immune problems with lung infections  . Stroke Maternal Uncle   . Hypertension Paternal Aunt   . Hypertension Maternal Grandfather   . Cancer Maternal Grandfather        lung  . Cancer Paternal Grandfather        lung  . Hypertension Paternal Aunt   . Cancer Maternal Grandmother        lung cancer  . Stroke Paternal Grandmother     Social History Social History   Tobacco Use  . Smoking status: Former Games developer  . Smokeless tobacco: Never Used  Substance Use Topics  . Alcohol use: Yes    Comment: socially  . Drug use: No    Review of Systems  Constitutional: No fever/chills Cardiovascular: Denies chest pain. Respiratory: Denies shortness of breath. Gastrointestinal: No abdominal pain.  No nausea, no vomiting.  No diarrhea.  No constipation. Genitourinary: As above. Musculoskeletal: As above. Skin: Negative for rash.   ____________________________________________   PHYSICAL EXAM:  VITAL SIGNS: ED Triage Vitals [11/01/17 1940]  Enc Vitals Group     BP 132/77     Pulse Rate 76     Resp 17     Temp 98.1 F (36.7 C)     Temp Source Oral      SpO2 100 %     Weight 180 lb (81.6 kg)     Height 5\' 6"  (1.676 m)     Head Circumference      Peak Flow      Pain Score 5     Pain Loc      Pain Edu?      Excl. in GC?     Constitutional: Alert and oriented. Well appearing and in no acute distress. Eyes: Conjunctivae are normal. PERRL. EOMI. ENT      Head: Normocephalic and atraumatic.      Nose: No congestion/rhinnorhea.      Mouth/Throat: Mucous membranes are moist.Oropharynx non-erythematous. Neck: No stridor. Supple without meningismus.  Hematological/Lymphatic/Immunilogical: No cervical lymphadenopathy. Cardiovascular: Normal rate, regular rhythm. Grossly normal heart sounds.  Good peripheral circulation. Respiratory: Normal respiratory effort without tachypnea nor retractions. Breath sounds are clear and equal bilaterally. No wheezes, rales, rhonchi. Gastrointestinal: Soft and nontender. No distention. Normal Bowel sounds. No CVA tenderness. Pelvic: Exam completed with Melissa CMA at bedside as chaperone.  External: Normal appearance, no rash or lesions.  Speculum: Large amount of yellowish whitish thick vaginal discharge, cervical eyes closed, no bleeding.  Bimanual: Nontender, no cervical or adnexal tenderness bilaterally. Musculoskeletal:   No midline cervical, thoracic or lumbar tenderness to palpation.  Neurologic:  Normal speech and language. Speech is normal. No gait instability.  Skin:  Skin is warm, dry and intact. No rash noted. Psychiatric: Mood and affect are normal. Speech and behavior are normal. Patient exhibits appropriate insight and judgment   ___________________________________________   LABS (all labs ordered are listed, but only abnormal results are displayed)  Labs Reviewed  WET PREP, GENITAL - Abnormal; Notable for the following components:      Result Value   Clue Cells Wet Prep HPF POC PRESENT (*)    WBC, Wet Prep HPF POC MANY (*)    All other components within normal limits  URINALYSIS, COMPLETE  (UACMP) WITH MICROSCOPIC - Abnormal; Notable for the following components:   APPearance CLOUDY (*)    Hgb urine dipstick TRACE (*)    Nitrite POSITIVE (*)    Leukocytes, UA LARGE (*)    Squamous Epithelial / LPF 6-30 (*)    Bacteria, UA MANY (*)    All other components within normal limits  CHLAMYDIA/NGC RT PCR ([facility] ONLY)  URINE CULTURE  PREGNANCY, URINE    PROCEDURES Procedures    INITIAL IMPRESSION / ASSESSMENT AND PLAN / ED COURSE  Pertinent labs & imaging results that were available during my care of the patient were reviewed by me and considered in my medical decision making (see chart for details).  Very well-appearing patient.  No acute distress.  Presenting for continued complaints of dysuria.  Reporting that intermittent antibiotics improve symptoms but questionable if ever fully  resolved.  No flank pain.  Afebrile.  Urinalysis reviewed, suspect UTI.  Patient states tolerates Keflex well in the past  and has not recently used Keflex, will treat UTI with Keflex and await urine culture.  Considered administration of IM Rocephin, but patient reports she did not like the way that medication in the past made her feel.  Pelvic exam completed.  Positive positive clue cells, also suspect yeast vaginitis.  Will also treat with Flagyl and Diflucan.  Counseled regarding no alcohol during medication.  Encourage pelvic rest and no sexual activity.  Patient declined other STD testing.  Strongly encouraged patient to promptly follow up with urology.  Encourage rest, fluids, supportive care.Discussed indication, risks and benefits of medications with patient.  Discussed follow up with Primary care physician this week. Discussed follow up and return parameters including no resolution or any worsening concerns. Patient verbalized understanding and agreed to plan.   ____________________________________________   FINAL CLINICAL IMPRESSION(S) / ED DIAGNOSES  Final diagnoses:  Vaginal  discharge  Bacterial vaginosis  Urinary tract infection with hematuria, site unspecified     ED Discharge Orders        Ordered    cephALEXin (KEFLEX) 500 MG capsule  2 times daily     11/01/17 2110    fluconazole (DIFLUCAN) 150 MG tablet  Daily     11/01/17 2110    metroNIDAZOLE (FLAGYL) 500 MG tablet  2 times daily     11/01/17 2110       Note: This dictation was prepared with Dragon dictation along with smaller phrase technology. Any transcriptional errors that result from this process are unintentional.         Renford DillsMiller, Avondre Richens, NP 11/01/17 2157

## 2017-11-01 NOTE — ED Triage Notes (Addendum)
Patient complains of urinary pain. Patient states that she just recently finished Bactrim. Patient also reports some vaginal discharge and itching Saturday evening. Patient states that she had unprotected sex on Friday evening and took a day after pill. Patient states that is when she noticed the vaginal symptoms.

## 2017-11-02 LAB — CHLAMYDIA/NGC RT PCR (ARMC ONLY)
CHLAMYDIA TR: NOT DETECTED
N GONORRHOEAE: NOT DETECTED

## 2017-11-04 LAB — URINE CULTURE

## 2017-11-10 ENCOUNTER — Telehealth: Payer: Self-pay

## 2017-11-10 MED ORDER — FLUCONAZOLE 150 MG PO TABS: 150.0000 mg | ORAL_TABLET | Freq: Once | ORAL | 0 refills | Status: AC

## 2017-11-10 NOTE — Telephone Encounter (Signed)
Patient was prescribed diflucan but lost the 2nd dose. Patient wanted to know if we could send her in an additional dosage. I have sent in additional rx to [facility].

## 2017-11-20 ENCOUNTER — Other Ambulatory Visit: Payer: Self-pay

## 2017-11-20 ENCOUNTER — Inpatient Hospital Stay
Admission: EM | Admit: 2017-11-20 | Discharge: 2017-11-24 | DRG: 872 | Disposition: A | Discharge status: Home / Self Care | Payer: Managed Care, Other (non HMO) | Attending: Internal Medicine | Admitting: Internal Medicine

## 2017-11-20 ENCOUNTER — Emergency Department: Payer: Managed Care, Other (non HMO)

## 2017-11-20 ENCOUNTER — Encounter: Payer: Self-pay | Admitting: Emergency Medicine

## 2017-11-20 DIAGNOSIS — N12 Tubulo-interstitial nephritis, not specified as acute or chronic: Secondary | ICD-10-CM | POA: Diagnosis not present

## 2017-11-20 DIAGNOSIS — Z79899 Other long term (current) drug therapy: Secondary | ICD-10-CM

## 2017-11-20 DIAGNOSIS — Z87891 Personal history of nicotine dependence: Secondary | ICD-10-CM

## 2017-11-20 DIAGNOSIS — Z87440 Personal history of urinary (tract) infections: Secondary | ICD-10-CM

## 2017-11-20 DIAGNOSIS — N136 Pyonephrosis: Secondary | ICD-10-CM | POA: Diagnosis present

## 2017-11-20 DIAGNOSIS — E8809 Other disorders of plasma-protein metabolism, not elsewhere classified: Secondary | ICD-10-CM | POA: Diagnosis present

## 2017-11-20 DIAGNOSIS — A4151 Sepsis due to Escherichia coli [E. coli]: Secondary | ICD-10-CM | POA: Diagnosis not present

## 2017-11-20 DIAGNOSIS — A419 Sepsis, unspecified organism: Secondary | ICD-10-CM | POA: Diagnosis present

## 2017-11-20 DIAGNOSIS — E876 Hypokalemia: Secondary | ICD-10-CM | POA: Diagnosis present

## 2017-11-20 LAB — COMPREHENSIVE METABOLIC PANEL
ALBUMIN: 2.7 g/dL — AB (ref 3.5–5.0)
ALK PHOS: 61 U/L (ref 38–126)
ALT: 14 U/L (ref 14–54)
ANION GAP: 4 — AB (ref 5–15)
AST: 15 U/L (ref 15–41)
BILIRUBIN TOTAL: 0.8 mg/dL (ref 0.3–1.2)
BUN: 8 mg/dL (ref 6–20)
CALCIUM: 6 mg/dL — AB (ref 8.9–10.3)
CO2: 19 mmol/L — AB (ref 22–32)
Chloride: 118 mmol/L — ABNORMAL HIGH (ref 101–111)
Creatinine, Ser: 0.59 mg/dL (ref 0.44–1.00)
GFR calc Af Amer: >60 mL/min (ref >60)
GFR calc non Af Amer: >60 mL/min (ref >60)
GLUCOSE: 91 mg/dL (ref 65–99)
Potassium: 2.4 mmol/L — CL (ref 3.5–5.1)
SODIUM: 141 mmol/L (ref 135–145)
Total Protein: 5.2 g/dL — ABNORMAL LOW (ref 6.5–8.1)

## 2017-11-20 LAB — URINALYSIS, COMPLETE (UACMP) WITH MICROSCOPIC
Bilirubin Urine: NEGATIVE
GLUCOSE, UA: NEGATIVE mg/dL
Ketones, ur: NEGATIVE mg/dL
NITRITE: POSITIVE — AB
PH: 6 (ref 5.0–8.0)
PROTEIN: 30 mg/dL — AB
SPECIFIC GRAVITY, URINE: 1.011 (ref 1.005–1.030)

## 2017-11-20 LAB — CBC WITH DIFFERENTIAL/PLATELET
BASOS ABS: 0 10*3/uL (ref 0–0.1)
BASOS PCT: 0 %
EOS ABS: 0.1 10*3/uL (ref 0–0.7)
EOS PCT: 0 %
HEMATOCRIT: 43.3 % (ref 35.0–47.0)
Hemoglobin: 14.5 g/dL (ref 12.0–16.0)
Lymphocytes Relative: 5 %
Lymphs Abs: 0.9 10*3/uL — ABNORMAL LOW (ref 1.0–3.6)
MCH: 28.4 pg (ref 26.0–34.0)
MCHC: 33.5 g/dL (ref 32.0–36.0)
MCV: 85 fL (ref 80.0–100.0)
Monocytes Absolute: 0.5 10*3/uL (ref 0.2–0.9)
Monocytes Relative: 3 %
NEUTROS ABS: 16.2 10*3/uL — AB (ref 1.4–6.5)
Neutrophils Relative %: 92 %
PLATELETS: 226 10*3/uL (ref 150–440)
RBC: 5.1 MIL/uL (ref 3.80–5.20)
RDW: 14 % (ref 11.5–14.5)
WBC: 17.7 10*3/uL — ABNORMAL HIGH (ref 3.6–11.0)

## 2017-11-20 LAB — PROTIME-INR
INR: 1.36
Prothrombin Time: 16.7 seconds — ABNORMAL HIGH (ref 11.4–15.2)

## 2017-11-20 LAB — LACTIC ACID, PLASMA
LACTIC ACID, VENOUS: 2.4 mmol/L — AB (ref 0.5–1.9)
Lactic Acid, Venous: 1 mmol/L (ref 0.5–1.9)

## 2017-11-20 LAB — POCT PREGNANCY, URINE: Preg Test, Ur: NEGATIVE

## 2017-11-20 LAB — HCG, QUANTITATIVE, PREGNANCY: hCG, Beta Chain, Quant, S: <1 m[IU]/mL (ref <5)

## 2017-11-20 MED ORDER — SODIUM CHLORIDE 0.9 % IV SOLN
2.0000 g | Freq: Once | INTRAVENOUS | Status: DC
  Filled 2017-11-20: qty 2

## 2017-11-20 MED ORDER — PROMETHAZINE HCL 25 MG/ML IJ SOLN
12.5000 mg | Freq: Four times a day (QID) | INTRAMUSCULAR | Status: DC | PRN
Start: 2017-11-20 — End: 2017-11-24
  Administered 2017-11-20: 12.5 mg via INTRAVENOUS
  Filled 2017-11-20: qty 1

## 2017-11-20 MED ORDER — VANCOMYCIN HCL IN DEXTROSE 1-5 GM/200ML-% IV SOLN: 1000.0000 mg | Freq: Once | INTRAVENOUS | Status: DC

## 2017-11-20 MED ORDER — SODIUM CHLORIDE 0.9 % IV BOLUS (SEPSIS)
1000.0000 mL | Freq: Once | INTRAVENOUS | Status: AC
  Administered 2017-11-20: 1000 mL via INTRAVENOUS

## 2017-11-20 MED ORDER — FENTANYL CITRATE (PF) 100 MCG/2ML IJ SOLN
50.0000 ug | INTRAMUSCULAR | Status: DC | PRN
  Administered 2017-11-20: 50 ug via INTRAVENOUS
  Filled 2017-11-20: qty 2

## 2017-11-20 MED ORDER — SODIUM CHLORIDE 0.9 % IV SOLN: Freq: Once | INTRAVENOUS | Status: DC

## 2017-11-20 MED ORDER — IOPAMIDOL (ISOVUE-300) INJECTION 61%
100.0000 mL | Freq: Once | INTRAVENOUS | Status: AC | PRN
  Administered 2017-11-20: 100 mL via INTRAVENOUS

## 2017-11-20 MED ORDER — SODIUM CHLORIDE 0.9 % IV SOLN
2.0000 g | Freq: Once | INTRAVENOUS | Status: AC
  Administered 2017-11-20: 2 g via INTRAVENOUS
  Filled 2017-11-20: qty 20

## 2017-11-20 NOTE — ED Notes (Signed)
Patient transported to X-ray 

## 2017-11-20 NOTE — ED Triage Notes (Signed)
Pt to ED via [facility] from work for abdominal pain. Pt recently finished antibiotics for urinary infection. Pt states that she started having pain yesterday. Pt states that she is having burning with urination and the urge to urinate but nothing comes out. Pt is tachycardic and febrile on arrival to ED. Pt in NAD at this time.

## 2017-11-20 NOTE — ED Provider Notes (Signed)
[facility] Emergency Department Provider Note    First MD Initiated Contact with Patient 11/20/17 1810     (approximate)  I have reviewed the triage vital signs and the nursing notes.   HISTORY  Chief Complaint Abdominal Pain    HPI Kylie Martin is a 27 y.o. female with a history of recurrent urinary tract infections and pyelonephritis presents to the ER for generalized malaise abdominal pain dysuria and hematuria became significantly worse today while going to work.  Patient arrives via EMS.  Ill-appearing.  States that she is having mild to moderate pain in her flanks as well as her right lower quadrant.  Denies any vaginal discharge or bleeding.  Past Medical History:  Diagnosis Date  . Anemia   . HSV-2 (herpes simplex virus 2) infection   . Trichomonas    Family History  Problem Relation Age of Onset  . Hypertension Father   . Diabetes Father   . COPD Sister        born with RSV has immune problems with lung infections  . Stroke Maternal Uncle   . Hypertension Paternal Aunt   . Hypertension Maternal Grandfather   . Cancer Maternal Grandfather        lung  . Cancer Paternal Grandfather        lung  . Hypertension Paternal Aunt   . Cancer Maternal Grandmother        lung cancer  . Stroke Paternal Grandmother    Past Surgical History:  Procedure Laterality Date  . WISDOM TOOTH EXTRACTION     There are no active problems to display for this patient.     Prior to Admission medications   Medication Sig Start Date End Date Taking? Authorizing Provider  Butalbital-APAP-Caff-Cod (FIORICET/CODEINE) 50-300-40-30 MG CAPS Take by mouth.   Yes [provider]  fluconazole (DIFLUCAN) 150 MG tablet Take 150 mg by mouth once. 11/10/17   [provider]  metroNIDAZOLE (FLAGYL) 500 MG tablet Take 1 tablet (500 mg total) by mouth 2 (two) times daily. Patient not taking: Reported on 11/20/2017 11/01/17   Renford Dills, NP     Allergies Penicillins    Social History Social History   Tobacco Use  . Smoking status: Former Games developer  . Smokeless tobacco: Never Used  Substance Use Topics  . Alcohol use: Yes    Comment: socially  . Drug use: No    Review of Systems Patient denies headaches, rhinorrhea, blurry vision, numbness, shortness of breath, chest pain, edema, cough, abdominal pain, nausea, vomiting, diarrhea, dysuria, fevers, rashes or hallucinations unless otherwise stated above in HPI. ____________________________________________   PHYSICAL EXAM:  VITAL SIGNS: Vitals:   11/20/17 2228 11/20/17 2312  BP: 128/71 125/79  Pulse: (!) 102 (!) 115  Resp: 18 20  Temp: (!) 101.1 F (38.4 C)   SpO2: 99% 100%    Constitutional: Alert and oriented. Ill apppearing but in no acute distress. Eyes: Conjunctivae are normal.  Head: Atraumatic. Nose: No congestion/rhinnorhea. Mouth/Throat: Mucous membranes are moist.   Neck: No stridor. Painless ROM.  Cardiovascular: tachycardic, regular rhythm. Grossly normal heart sounds.  Good peripheral circulation. Respiratory: Normal respiratory effort.  No retractions. Lungs CTAB. Gastrointestinal: Soft with ttp in rlq. No distention. No abdominal bruits. + bilateral CVA tenderness. Musculoskeletal: No lower extremity tenderness nor edema.  No joint effusions. Neurologic:  Normal speech and language. No gross focal neurologic deficits are appreciated. No facial droop Skin:  Skin is warm, dry and intact. No rash  noted. Psychiatric: Mood and affect are normal. Speech and behavior are normal.  ____________________________________________   LABS (all labs ordered are listed, but only abnormal results are displayed)  Results for orders placed or performed during the hospital encounter of 11/20/17 (from the past 24 hour(s))  Lactic acid, plasma     Status: Abnormal   Collection Time: 11/20/17  6:28 PM  Result Value Ref Range   Lactic Acid, Venous 2.4 (HH) 0.5  - 1.9 mmol/L  CBC with Differential     Status: Abnormal   Collection Time: 11/20/17  6:28 PM  Result Value Ref Range   WBC 17.7 (H) 3.6 - 11.0 K/uL   RBC 5.10 3.80 - 5.20 MIL/uL   Hemoglobin 14.5 12.0 - 16.0 g/dL   HCT 96.0 45.4 - 09.8 %   MCV 85.0 80.0 - 100.0 fL   MCH 28.4 26.0 - 34.0 pg   MCHC 33.5 32.0 - 36.0 g/dL   RDW 11.9 14.7 - 82.9 %   Platelets 226 150 - 440 K/uL   Neutrophils Relative % 92 %   Neutro Abs 16.2 (H) 1.4 - 6.5 K/uL   Lymphocytes Relative 5 %   Lymphs Abs 0.9 (L) 1.0 - 3.6 K/uL   Monocytes Relative 3 %   Monocytes Absolute 0.5 0.2 - 0.9 K/uL   Eosinophils Relative 0 %   Eosinophils Absolute 0.1 0 - 0.7 K/uL   Basophils Relative 0 %   Basophils Absolute 0.0 0 - 0.1 K/uL  Protime-INR     Status: Abnormal   Collection Time: 11/20/17  9:27 PM  Result Value Ref Range   Prothrombin Time 16.7 (H) 11.4 - 15.2 seconds   INR 1.36   Comprehensive metabolic panel     Status: Abnormal   Collection Time: 11/20/17  9:27 PM  Result Value Ref Range   Sodium 141 135 - 145 mmol/L   Potassium 2.4 (LL) 3.5 - 5.1 mmol/L   Chloride 118 (H) 101 - 111 mmol/L   CO2 19 (L) 22 - 32 mmol/L   Glucose, Bld 91 65 - 99 mg/dL   BUN 8 6 - 20 mg/dL   Creatinine, Ser 5.62 0.44 - 1.00 mg/dL   Calcium 6.0 (LL) 8.9 - 10.3 mg/dL   Total Protein 5.2 (L) 6.5 - 8.1 g/dL   Albumin 2.7 (L) 3.5 - 5.0 g/dL   AST 15 15 - 41 U/L   ALT 14 14 - 54 U/L   Alkaline Phosphatase 61 38 - 126 U/L   Total Bilirubin 0.8 0.3 - 1.2 mg/dL   GFR calc non Af Amer >60 >60 mL/min   GFR calc Af Amer >60 >60 mL/min   Anion gap 4 (L) 5 - 15  hCG, quantitative, pregnancy     Status: None   Collection Time: 11/20/17  9:27 PM  Result Value Ref Range   hCG, Beta Chain, Quant, S <1 <5 mIU/mL  Urinalysis, Complete w Microscopic     Status: Abnormal   Collection Time: 11/20/17 10:04 PM  Result Value Ref Range   Color, Urine YELLOW (A) YELLOW   APPearance CLOUDY (A) CLEAR   Specific Gravity, Urine 1.011 1.005 -  1.030   pH 6.0 5.0 - 8.0   Glucose, UA NEGATIVE NEGATIVE mg/dL   Hgb urine dipstick MODERATE (A) NEGATIVE   Bilirubin Urine NEGATIVE NEGATIVE   Ketones, ur NEGATIVE NEGATIVE mg/dL   Protein, ur 30 (A) NEGATIVE mg/dL   Nitrite POSITIVE (A) NEGATIVE   Leukocytes, UA LARGE (A) NEGATIVE  RBC / HPF 6-30 0 - 5 RBC/hpf   WBC, UA TOO NUMEROUS TO COUNT 0 - 5 WBC/hpf   Bacteria, UA MANY (A) NONE SEEN   Squamous Epithelial / LPF 6-30 (A) NONE SEEN   WBC Clumps PRESENT    Mucus PRESENT   Pregnancy, urine POC     Status: None   Collection Time: 11/20/17 10:15 PM  Result Value Ref Range   Preg Test, Ur NEGATIVE NEGATIVE  Lactic acid, plasma     Status: None   Collection Time: 11/20/17 10:20 PM  Result Value Ref Range   Lactic Acid, Venous 1.0 0.5 - 1.9 mmol/L   ____________________________________________  EKG My review and personal interpretation at Time: 18:16   Indication: sepsis  Rate: 125  Rhythm: sinus Axis: normal Other: nonspecific st changes, normal intervals ____________________________________________  RADIOLOGY  I personally reviewed all radiographic images ordered to evaluate for the above acute complaints and reviewed radiology reports and findings.  These findings were personally discussed with the patient.  Please see medical record for radiology report.  ____________________________________________   PROCEDURES  Procedure(s) performed:  .Critical Care Performed by: Willy Eddy, MD Authorized by: Willy Eddy, MD   Critical care provider statement:    Critical care time (minutes):  30   Critical care time was exclusive of:  Separately billable procedures and treating other patients   Critical care was necessary to treat or prevent imminent or life-threatening deterioration of the following conditions:  Sepsis   Critical care was time spent personally by me on the following activities:  Development of treatment plan with patient or surrogate, discussions  with consultants, evaluation of patient's response to treatment, examination of patient, obtaining history from patient or surrogate, ordering and performing treatments and interventions, ordering and review of laboratory studies, ordering and review of radiographic studies, pulse oximetry, re-evaluation of patient's condition and review of old charts      Critical Care performed: yes ____________________________________________   INITIAL IMPRESSION / ASSESSMENT AND PLAN / ED COURSE  Pertinent labs & imaging results that were available during my care of the patient were reviewed by me and considered in my medical decision making (see chart for details).  DDX: appy, uti. Stone, pyelo, colitis, enteritis  Kylie Martin is a 27 y.o. who presents to the ED with was as above.  Patient does appear acutely ill, febrile and tachycardic concerning for sepsis particular given her complicated history of recurrent UTIs particular staph aureus.  Patient does have significant tenderness about patient on exam therefore will order CT abdomen to evaluate for acute appendicitis or stone.  Will give IV fluids for resuscitation.  The patient will be placed on continuous pulse oximetry and telemetry for monitoring.  Laboratory evaluation will be sent to evaluate for the above complaints.     Clinical Course as of Nov 20 2336  Sat Nov 20, 2017  2251 Urinalysis shows evidence of nitrite positive UTI with moderate red cells.  CT imaging still pending.  In review of patient's previous urine cultures did grow out staph aureus that was sensitive.  Will start broad-spectrum antibiotics due to her sepsis and recurrent UTIs.  [PR]    Clinical Course User Index [PR] Willy Eddy, MD     ____________________________________________   FINAL CLINICAL IMPRESSION(S) / ED DIAGNOSES  Final diagnoses:  Sepsis, due to unspecified organism [facility])  Pyelonephritis      NEW MEDICATIONS STARTED DURING THIS  VISIT:  New Prescriptions   No medications on file  Note:  This document was prepared using Dragon voice recognition software and may include unintentional dictation errors.    Willy Eddyobinson, Nohealani Medinger, MD 11/20/17 [phone]

## 2017-11-21 ENCOUNTER — Other Ambulatory Visit: Payer: Self-pay

## 2017-11-21 ENCOUNTER — Inpatient Hospital Stay (HOSPITAL_COMMUNITY)
Admit: 2017-11-21 | Discharge: 2017-11-21 | Disposition: A | Discharge status: Home / Self Care | Payer: Managed Care, Other (non HMO) | Attending: Internal Medicine | Admitting: Internal Medicine

## 2017-11-21 DIAGNOSIS — Z87891 Personal history of nicotine dependence: Secondary | ICD-10-CM | POA: Diagnosis not present

## 2017-11-21 DIAGNOSIS — A419 Sepsis, unspecified organism: Secondary | ICD-10-CM | POA: Diagnosis not present

## 2017-11-21 DIAGNOSIS — N136 Pyonephrosis: Secondary | ICD-10-CM | POA: Diagnosis present

## 2017-11-21 DIAGNOSIS — N12 Tubulo-interstitial nephritis, not specified as acute or chronic: Secondary | ICD-10-CM | POA: Diagnosis present

## 2017-11-21 DIAGNOSIS — Z87440 Personal history of urinary (tract) infections: Secondary | ICD-10-CM | POA: Diagnosis not present

## 2017-11-21 DIAGNOSIS — A4151 Sepsis due to Escherichia coli [E. coli]: Secondary | ICD-10-CM | POA: Diagnosis present

## 2017-11-21 DIAGNOSIS — R7881 Bacteremia: Secondary | ICD-10-CM

## 2017-11-21 DIAGNOSIS — I340 Nonrheumatic mitral (valve) insufficiency: Secondary | ICD-10-CM

## 2017-11-21 DIAGNOSIS — E876 Hypokalemia: Secondary | ICD-10-CM | POA: Diagnosis present

## 2017-11-21 DIAGNOSIS — E8809 Other disorders of plasma-protein metabolism, not elsewhere classified: Secondary | ICD-10-CM | POA: Diagnosis present

## 2017-11-21 DIAGNOSIS — Z79899 Other long term (current) drug therapy: Secondary | ICD-10-CM | POA: Diagnosis not present

## 2017-11-21 LAB — ECHOCARDIOGRAM COMPLETE
HEIGHTINCHES: 66 in
WEIGHTICAEL: 3076.8 [oz_av]

## 2017-11-21 LAB — BLOOD CULTURE ID PANEL (REFLEXED)
ACINETOBACTER BAUMANNII: NOT DETECTED
CANDIDA GLABRATA: NOT DETECTED
CANDIDA PARAPSILOSIS: NOT DETECTED
Candida albicans: NOT DETECTED
Candida krusei: NOT DETECTED
Candida tropicalis: NOT DETECTED
Carbapenem resistance: NOT DETECTED
ESCHERICHIA COLI: DETECTED — AB
Enterobacter cloacae complex: NOT DETECTED
Enterobacteriaceae species: DETECTED — AB
Enterococcus species: NOT DETECTED
HAEMOPHILUS INFLUENZAE: NOT DETECTED
KLEBSIELLA PNEUMONIAE: NOT DETECTED
Klebsiella oxytoca: NOT DETECTED
Listeria monocytogenes: NOT DETECTED
NEISSERIA MENINGITIDIS: NOT DETECTED
Proteus species: NOT DETECTED
Pseudomonas aeruginosa: NOT DETECTED
SERRATIA MARCESCENS: NOT DETECTED
STAPHYLOCOCCUS SPECIES: NOT DETECTED
STREPTOCOCCUS AGALACTIAE: NOT DETECTED
Staphylococcus aureus (BCID): NOT DETECTED
Streptococcus pneumoniae: NOT DETECTED
Streptococcus pyogenes: NOT DETECTED
Streptococcus species: NOT DETECTED

## 2017-11-21 LAB — CBC
HCT: 36.7 % (ref 35.0–47.0)
Hemoglobin: 12.6 g/dL (ref 12.0–16.0)
MCH: 29.4 pg (ref 26.0–34.0)
MCHC: 34.2 g/dL (ref 32.0–36.0)
MCV: 86 fL (ref 80.0–100.0)
PLATELETS: 200 10*3/uL (ref 150–440)
RBC: 4.27 MIL/uL (ref 3.80–5.20)
RDW: 14 % (ref 11.5–14.5)
WBC: 12.7 10*3/uL — AB (ref 3.6–11.0)

## 2017-11-21 LAB — BASIC METABOLIC PANEL
Anion gap: 5 (ref 5–15)
BUN: 10 mg/dL (ref 6–20)
CHLORIDE: 108 mmol/L (ref 101–111)
CO2: 25 mmol/L (ref 22–32)
CREATININE: 0.86 mg/dL (ref 0.44–1.00)
Calcium: 7.8 mg/dL — ABNORMAL LOW (ref 8.9–10.3)
GFR calc non Af Amer: >60 mL/min (ref >60)
Glucose, Bld: 151 mg/dL — ABNORMAL HIGH (ref 65–99)
POTASSIUM: 3.3 mmol/L — AB (ref 3.5–5.1)
SODIUM: 138 mmol/L (ref 135–145)

## 2017-11-21 LAB — GLUCOSE, CAPILLARY: Glucose-Capillary: 97 mg/dL (ref 65–99)

## 2017-11-21 LAB — MAGNESIUM: Magnesium: 1.7 mg/dL (ref 1.7–2.4)

## 2017-11-21 LAB — POTASSIUM: POTASSIUM: 3.3 mmol/L — AB (ref 3.5–5.1)

## 2017-11-21 MED ORDER — ACETAMINOPHEN 650 MG RE SUPP: 650.0000 mg | Freq: Four times a day (QID) | RECTAL | Status: DC | PRN

## 2017-11-21 MED ORDER — CALCIUM CARBONATE-VITAMIN D 500-200 MG-UNIT PO TABS
1.0000 | ORAL_TABLET | Freq: Two times a day (BID) | ORAL | Status: DC
  Administered 2017-11-21 – 2017-11-24 (×5): 1 via ORAL
  Filled 2017-11-21 (×7): qty 1

## 2017-11-21 MED ORDER — INFLUENZA VAC SPLIT QUAD 0.5 ML IM SUSY: 0.5000 mL | PREFILLED_SYRINGE | INTRAMUSCULAR | Status: DC

## 2017-11-21 MED ORDER — SODIUM CHLORIDE 0.9 % IV SOLN
INTRAVENOUS | Status: DC
  Administered 2017-11-21 – 2017-11-24 (×4): via INTRAVENOUS

## 2017-11-21 MED ORDER — HEPARIN SODIUM (PORCINE) 5000 UNIT/ML IJ SOLN
5000.0000 [IU] | Freq: Three times a day (TID) | INTRAMUSCULAR | Status: DC
  Filled 2017-11-21 (×3): qty 1

## 2017-11-21 MED ORDER — DOCUSATE SODIUM 100 MG PO CAPS
100.0000 mg | ORAL_CAPSULE | Freq: Two times a day (BID) | ORAL | Status: DC
  Administered 2017-11-21 – 2017-11-24 (×3): 100 mg via ORAL
  Filled 2017-11-21 (×5): qty 1

## 2017-11-21 MED ORDER — BISACODYL 5 MG PO TBEC: 5.0000 mg | DELAYED_RELEASE_TABLET | Freq: Every day | ORAL | Status: DC | PRN

## 2017-11-21 MED ORDER — MORPHINE SULFATE (PF) 2 MG/ML IV SOLN: 2.0000 mg | INTRAVENOUS | Status: DC | PRN

## 2017-11-21 MED ORDER — IBUPROFEN 400 MG PO TABS
400.0000 mg | ORAL_TABLET | Freq: Four times a day (QID) | ORAL | Status: DC | PRN
  Administered 2017-11-21 – 2017-11-22 (×2): 400 mg via ORAL
  Filled 2017-11-21 (×3): qty 1

## 2017-11-21 MED ORDER — MAGNESIUM SULFATE 2 GM/50ML IV SOLN
2.0000 g | Freq: Once | INTRAVENOUS | Status: AC
  Administered 2017-11-21: 2 g via INTRAVENOUS
  Filled 2017-11-21: qty 50

## 2017-11-21 MED ORDER — SODIUM CHLORIDE 0.9 % IV SOLN: 1.0000 g | INTRAVENOUS | Status: DC

## 2017-11-21 MED ORDER — TRAZODONE HCL 50 MG PO TABS: 25.0000 mg | ORAL_TABLET | Freq: Every evening | ORAL | Status: DC | PRN

## 2017-11-21 MED ORDER — HYDROCODONE-ACETAMINOPHEN 5-325 MG PO TABS
1.0000 | ORAL_TABLET | ORAL | Status: DC | PRN
  Administered 2017-11-21 – 2017-11-23 (×7): 2 via ORAL
  Filled 2017-11-21 (×7): qty 2

## 2017-11-21 MED ORDER — ONDANSETRON HCL 4 MG/2ML IJ SOLN
4.0000 mg | Freq: Four times a day (QID) | INTRAMUSCULAR | Status: DC | PRN
Start: 2017-11-21 — End: 2017-11-24
  Administered 2017-11-22: 4 mg via INTRAVENOUS
  Filled 2017-11-21: qty 2

## 2017-11-21 MED ORDER — ACETAMINOPHEN 325 MG PO TABS
650.0000 mg | ORAL_TABLET | Freq: Four times a day (QID) | ORAL | Status: DC | PRN
  Administered 2017-11-21: 650 mg via ORAL
  Filled 2017-11-21 (×2): qty 2

## 2017-11-21 MED ORDER — CEFTRIAXONE SODIUM 2 G IJ SOLR
2.0000 g | INTRAMUSCULAR | Status: DC
  Administered 2017-11-21: 2 g via INTRAVENOUS
  Filled 2017-11-21: qty 20

## 2017-11-21 MED ORDER — POTASSIUM CHLORIDE 10 MEQ/100ML IV SOLN
10.0000 meq | INTRAVENOUS | Status: DC | PRN
  Administered 2017-11-21: 10 meq via INTRAVENOUS
  Filled 2017-11-21: qty 100

## 2017-11-21 MED ORDER — ONDANSETRON HCL 4 MG PO TABS: 4.0000 mg | ORAL_TABLET | Freq: Four times a day (QID) | ORAL | Status: DC | PRN

## 2017-11-21 MED ORDER — MEROPENEM 1 G IV SOLR
1.0000 g | Freq: Three times a day (TID) | INTRAVENOUS | Status: DC
  Administered 2017-11-21 – 2017-11-23 (×6): 1 g via INTRAVENOUS
  Filled 2017-11-21 (×9): qty 1

## 2017-11-21 NOTE — Progress Notes (Signed)
PHARMACY - PHYSICIAN COMMUNICATION CRITICAL VALUE ALERT - BLOOD CULTURE IDENTIFICATION (BCID)  Kylie Martin is an 27 y.o. female who [facility] with a chief complaint of UTI.   Assessment: BCID Enterobacteriaceae sp. and E.Coli 2/4 bottles (aerobic and anaerobic), no KPC detected  Name of physician (or Provider) Contacted: Dr. Elisabeth PigeonVachhani  Current antibiotics: Ceftriaxone 2g IV every 24 hours   Changes to prescribed antibiotics recommended:  Discontinue ceftriaxone and start meropenem 1g IV every 8 hours   Results for orders placed or performed during the hospital encounter of 11/20/17  Blood Culture ID Panel (Reflexed) (Collected: 11/20/2017  9:27 PM)  Result Value Ref Range   Enterococcus species NOT DETECTED NOT DETECTED   Listeria monocytogenes NOT DETECTED NOT DETECTED   Staphylococcus species NOT DETECTED NOT DETECTED   Staphylococcus aureus NOT DETECTED NOT DETECTED   Streptococcus species NOT DETECTED NOT DETECTED   Streptococcus agalactiae NOT DETECTED NOT DETECTED   Streptococcus pneumoniae NOT DETECTED NOT DETECTED   Streptococcus pyogenes NOT DETECTED NOT DETECTED   Acinetobacter baumannii NOT DETECTED NOT DETECTED   Enterobacteriaceae species DETECTED (A) NOT DETECTED   Enterobacter cloacae complex NOT DETECTED NOT DETECTED   Escherichia coli DETECTED (A) NOT DETECTED   Klebsiella oxytoca NOT DETECTED NOT DETECTED   Klebsiella pneumoniae NOT DETECTED NOT DETECTED   Proteus species NOT DETECTED NOT DETECTED   Serratia marcescens NOT DETECTED NOT DETECTED   Carbapenem resistance NOT DETECTED NOT DETECTED   Haemophilus influenzae NOT DETECTED NOT DETECTED   Neisseria meningitidis NOT DETECTED NOT DETECTED   Pseudomonas aeruginosa NOT DETECTED NOT DETECTED   Candida albicans NOT DETECTED NOT DETECTED   Candida glabrata NOT DETECTED NOT DETECTED   Candida krusei NOT DETECTED NOT DETECTED   Candida parapsilosis NOT DETECTED NOT DETECTED   Candida tropicalis NOT DETECTED NOT DETECTED    Cleopatra CedarStephanie Gabrielly Mccrystal, PharmD Pharmacy Resident  11/21/2017  1:19 PM

## 2017-11-21 NOTE — Progress Notes (Signed)
[employer] [facility] [place] [facility]   PATIENT NAME: Kylie Martin    MR#:  [id number]  DATE OF BIRTH:  July 11, 1991  SUBJECTIVE:  CHIEF COMPLAINT:   Chief Complaint  Patient presents with  . 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  REVIEW OF SYSTEMS:  CONSTITUTIONAL: No fever, fatigue or weakness.  EYES: No blurred or double vision.  EARS, NOSE, AND THROAT: No tinnitus or ear pain.  RESPIRATORY: No cough, shortness of breath, wheezing or hemoptysis.  CARDIOVASCULAR: No chest pain, orthopnea, edema.  GASTROINTESTINAL: No nausea, vomiting, diarrhea or abdominal pain.  GENITOURINARY: No dysuria, hematuria.  ENDOCRINE: No polyuria, nocturia,  HEMATOLOGY: No anemia, easy bruising or bleeding SKIN: No rash or lesion. MUSCULOSKELETAL: No joint pain or arthritis.   NEUROLOGIC: No tingling, numbness, weakness.  PSYCHIATRY: No anxiety or depression.   ROS  DRUG ALLERGIES:   Allergies  Allergen Reactions  . Penicillins Rash    "only with penicillin injection"    VITALS:  Blood pressure 118/61, pulse 66, temperature 98.1 F (36.7 C), temperature source Oral, resp. rate 18, height 5\' 6"  (1.676 m), weight 87.2 kg (192 lb 4.8 oz), last menstrual period 11/04/2017, SpO2 100 %.  PHYSICAL EXAMINATION:  GENERAL:  27 y.o.-year-old patient lying in the bed with no acute distress.  EYES: Pupils equal, round, reactive to light and accommodation. No scleral icterus. Extraocular muscles intact.  HEENT: Head atraumatic, normocephalic. Oropharynx and nasopharynx clear.  NECK:  Supple, no jugular venous distention. No thyroid enlargement, no tenderness.  LUNGS: Normal breath sounds bilaterally, no wheezing, rales,rhonchi or crepitation. No use of accessory muscles of respiration.  CARDIOVASCULAR: S1, S2 normal. No murmurs, rubs, or gallops.  ABDOMEN: Soft, nontender, nondistended. Bowel sounds present. No  organomegaly or mass. Bilateral costovertebral angle tenderness.  EXTREMITIES: No pedal edema, cyanosis, or clubbing.  NEUROLOGIC: Cranial nerves II through XII are intact. Muscle strength 5/5 in all extremities. Sensation intact. Gait not checked.  PSYCHIATRIC: The patient is alert and oriented x 3.  SKIN: No obvious rash, lesion, or ulcer.   Physical Exam LABORATORY PANEL:   CBC Recent Labs  Lab 11/21/17 0418  WBC 12.7*  HGB 12.6  HCT 36.7  PLT 200   ------------------------------------------------------------------------------------------------------------------  Chemistries  Recent Labs  Lab 11/20/17 2127 11/21/17 0418  NA 141 138  K 2.4* 3.3*  CL 118* 108  CO2 19* 25  GLUCOSE 91 151*  BUN 8 10  CREATININE 0.59 0.86  CALCIUM 6.0* 7.8*  MG  --  1.7  AST 15  --   ALT 14  --   ALKPHOS 61  --   BILITOT 0.8  --    ------------------------------------------------------------------------------------------------------------------  Cardiac Enzymes No results for input(s): TROPONINI in the last 168 hours. ------------------------------------------------------------------------------------------------------------------  RADIOLOGY:  Dg Chest 2 View  Result Date: 11/20/2017 CLINICAL DATA:  Abdominal pain. EXAM: CHEST  2 VIEW COMPARISON:  October 14, 2007 FINDINGS: Scoliotic curvature of the thoracic spine. The heart, hila, mediastinum, lungs, and pleura are otherwise unremarkable. IMPRESSION: No active cardiopulmonary disease. Electronically Signed   By: Gerome Sam III M.D   On: 11/20/2017 19:11   Ct Abdomen Pelvis W Contrast  Result Date: 11/20/2017 CLINICAL DATA:  Acute abdominal pain. Fever. Recent antibiotics for urinary tract infection. EXAM: CT ABDOMEN AND PELVIS WITH CONTRAST TECHNIQUE: Multidetector CT imaging of the abdomen and pelvis was performed using the standard protocol following bolus administration of  intravenous contrast. CONTRAST:  ISOVUE-300  IOPAMIDOL (ISOVUE-300) INJECTION 61% COMPARISON:  None. FINDINGS: Lower chest: Minimal dependent left lower lobe atelectasis. Hepatobiliary: 6 mm hypodensity in the right lobe of the liver too small to characterize, likely cyst or hemangioma. Gallbladder physiologically distended, no calcified stone. No biliary dilatation. Pancreas: No ductal dilatation or inflammation. Spleen: Normal in size without focal abnormality. Adrenals/Urinary Tract: No adrenal nodule. Right pyelonephritis with heterogeneous renal enhancement and ureteral thickening and enhancement. Mild right hydronephrosis. Mild perinephric and periureteric edema. A 13 mm low-density structure in the lower right kidney contains dependent calcification and may be a calyceal diverticulum. 15 mm low-density in the mid posterior right kidney and 11 mm low-density in the upper right kidney have adjacent cortical thinning and are likely additional caliceal diverticula. No perirenal abscess. Left pyelonephritis with heterogeneous enhancement and ureteral thickening and enhancement. Left upper pole scarring with parenchymal calcification. Mild left perinephric and periureteric edema. Possible calyceal diverticulum in the mid left kidney containing a calcification with adjacent cortical thinning. Additional scarring in the anterior lower left kidney with parenchymal calcifications. No perirenal abscess. Minimally distended urinary bladder with wall thickening and mild perivesicular stranding. Stomach/Bowel: Stomach is within normal limits. Appendix appears normal. No evidence of bowel wall thickening, distention, or inflammatory changes. Vascular/Lymphatic: Normal caliber abdominal aorta. Prominent retroperitoneal nodes are likely reactive. Reproductive: Retroverted uterus.  No adnexal mass. Other: Small amount of free fluid in the pelvis may be reactive or physiologic. No free air. Musculoskeletal: Scoliotic curvature of the thoracolumbar spine. There are no  acute or suspicious osseous abnormalities. IMPRESSION: 1. Urinary tract infection with cystitis and bilateral pyelonephritis. No renal or perirenal abscess. 2. Bilateral renal calyceal diverticula, some which contain stones. Mild left renal scarring. Electronically Signed   By: Rubye Oaks M.D.   On: 11/20/2017 23:12    ASSESSMENT AND PLAN:   Active Problems:   Sepsis (HCC)  *  Sepsis, secondary to bilateral pyelonephritis.    IV fluid resuscitation and antibiotic, ceftriaxone,    urine and blood culture    Urology consult due to recurrent UTI.  * Bacteremia   Change from Rocephin to meropenem IV.   She had multiple UTIs with staphylococci in the past.   I would like to check for echocardiogram to rule out endocarditis, called ID consult.    *  Acute bilateral pyelonephritis, in a patient with recurrent UTIs.  Patient was supposed to follow-up with urology as outpatient for further evaluation, but she did not get the chance to do that yet.  No gross anatomic abnormalities noted per abdomen and pelvis CAT scan.  She is recommended again to follow-up with urology for further testing, as outpatient.   *  Hypokalemia, will replace potassium per protocol.  * hypomagnesemia   replace IV>  * hypocalcemia   Due to hypoalbuminemia.   Replace magnesium, and would suggest good dietary habits.    All the records are reviewed and case discussed with Care Management/Social Workerr. Management plans discussed with the patient, family and they are in agreement.  CODE STATUS: full.  TOTAL TIME TAKING CARE OF THIS PATIENT: 35 minutes.   Discussed with patient and her mother in the room.  POSSIBLE D/C IN 1-2 DAYS, DEPENDING ON CLINICAL CONDITION.   Altamese Dilling M.D on 11/21/2017   Between 7am to 6pm - Pager - [phone]  After 6pm go to [email or web address] - Social research officer, government  [facility]  Office  [phone]  CC: Primary care physician; Patient,  No Pcp  Per  Note: This dictation was prepared with Dragon dictation along with smaller phrase technology. Any transcriptional errors that result from this process are unintentional.

## 2017-11-21 NOTE — Progress Notes (Signed)
Pharmacy Antibiotic Note  Kylie Martin is a 27 y.o. female admitted on 11/20/2017 with sepsis.  Pharmacy has been consulted for ceftriaxone dosing.  Plan: Will start ceftriaxone 2g IV daily  Height: 5\' 6"  (167.6 cm) Weight: 189 lb (85.7 kg) IBW/kg (Calculated) : 59.3  Temp (24hrs), Avg:102.2 F (39 C), Min:101.1 F (38.4 C), Max:103.3 F (39.6 C)  Recent Labs  Lab 11/20/17 1828 11/20/17 2127 11/20/17 2220  WBC 17.7*  --   --   CREATININE  --  0.59  --   LATICACIDVEN 2.4*  --  1.0    Estimated Creatinine Clearance: 118.6 mL/min (by C-G formula based on SCr of 0.59 mg/dL).    Allergies  Allergen Reactions  . Penicillins Rash    "only with penicillin injection"    Thank you for allowing pharmacy to be a part of this patient's care.  Thomasene Rippleavid Ashleen Demma, PharmD, BCPS Clinical Pharmacist  11/21/2017

## 2017-11-21 NOTE — H&P (Signed)
[facility] [place] [facility]   PATIENT NAME: Kylie Martin    MR#:  [id number]  DATE OF BIRTH:  29-Aug-1991  DATE OF ADMISSION:  11/20/2017  PRIMARY CARE PHYSICIAN: Patient, No Pcp Per   REQUESTING/REFERRING PHYSICIAN:   CHIEF COMPLAINT:   Chief Complaint  Patient presents with  . Abdominal Pain    HISTORY OF PRESENT ILLNESS: Kylie Martin  is a 27 y.o. 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 Patient was brought to emergency room for acute onset of severe bilateral flank pain, associated with fever and chills in the past 24 hours.  She also reports pain with urination and blood in urine going on for the past 2-3 days.  She took over-the-counter Tylenol without much improvement. Upon evaluation in the emergency room, she is noted to be febrile with temperature of 102.9.  Heart rate is 120.  WBC is elevated at 17.7, potassium is low at 2.4.  Lactic acid level is elevated at 2.4.  UA is positive for UTI.  CT abdomen and pelvis shows urinary tract infection with cystitis and bilateral pyelonephritis. No renal or perirenal abscess.  Patient is admitted for further evaluation and treatment.  PAST MEDICAL HISTORY:   Past Medical History:  Diagnosis Date  . Anemia   . HSV-2 (herpes simplex virus 2) infection   . Trichomonas     PAST SURGICAL HISTORY:  Past Surgical History:  Procedure Laterality Date  . WISDOM TOOTH EXTRACTION      SOCIAL HISTORY:  Social History   Tobacco Use  . Smoking status: Former Smoker    Types: E-cigarettes, Cigarettes  . Smokeless tobacco: Never Used  Substance Use Topics  . Alcohol use: Yes    Comment: socially    FAMILY HISTORY:  Family History  Problem Relation Age of Onset  . Hypertension Father   . Diabetes Father   . COPD Sister        born with RSV has immune problems with lung infections  . Stroke Maternal Uncle   . Hypertension Paternal Aunt   . Hypertension Maternal Grandfather    . Cancer Maternal Grandfather        lung  . Cancer Paternal Grandfather        lung  . Hypertension Paternal Aunt   . Cancer Maternal Grandmother        lung cancer  . Stroke Paternal Grandmother     DRUG ALLERGIES:  Allergies  Allergen Reactions  . Penicillins Rash    "only with penicillin injection"    REVIEW OF SYSTEMS:   CONSTITUTIONAL: Positive for fever, chills, fatigue and generalized weakness.  EYES: No blurred or double vision.  EARS, NOSE, AND THROAT: No tinnitus or ear pain.  RESPIRATORY: No cough, shortness of breath, wheezing or hemoptysis.  CARDIOVASCULAR: No chest pain, orthopnea, edema.  GASTROINTESTINAL: Positive for nausea; no vomiting, diarrhea or abdominal pain.  GENITOURINARY: Positive for dysuria and hematuria.  She denies any vaginal discharge ENDOCRINE: No polyuria, nocturia.  HEMATOLOGY: Notable for blood in urine. SKIN: No rash or lesion. MUSCULOSKELETAL: No joint pain.   NEUROLOGIC: No focal weakness.  PSYCHIATRY: No anxiety or depression.   MEDICATIONS AT HOME:  Prior to Admission medications   Medication Sig Start Date End Date Taking? Authorizing Provider  Butalbital-APAP-Caff-Cod (FIORICET/CODEINE) 50-300-40-30 MG CAPS Take by mouth.   Yes [provider]  fluconazole (DIFLUCAN) 150 MG tablet Take 150 mg by mouth once. 11/10/17   [provider]  metroNIDAZOLE (FLAGYL) 500 MG tablet Take 1 tablet (500 mg total) by mouth 2 (two) times daily. Patient not taking: Reported on 11/20/2017 11/01/17   Renford DillsMiller, Lindsey, NP      PHYSICAL EXAMINATION:   VITAL SIGNS: Blood pressure 129/67, pulse (!) 106, temperature (!) 102.9 F (39.4 C), temperature source Oral, resp. rate 18, height 5\' 6"  (1.676 m), weight 87.2 kg (192 lb 4.8 oz), last menstrual period 11/04/2017, SpO2 97 %.  GENERAL:  27 y.o.-year-old patient lying in the bed.  She is febrile and looks acutely ill.  EYES: Pupils equal, round, reactive to light and accommodation.  No scleral icterus. Extraocular muscles intact.  HEENT: Head atraumatic, normocephalic. Oropharynx and nasopharynx clear.  NECK:  Supple, no jugular venous distention. No thyroid enlargement, no tenderness.  LUNGS: Normal breath sounds bilaterally, no wheezing, rales,rhonchi or crepitation. No use of accessory muscles of respiration.  CARDIOVASCULAR: S1, S2 normal. No murmurs, rubs, or gallops.  ABDOMEN: Patient is tender with palpation at bilateral flank areas ; no abdominal distention; bowel sounds present. EXTREMITIES: No pedal edema, cyanosis, or clubbing.  NEUROLOGIC: No focal weakness. PSYCHIATRIC: The patient is alert and oriented x 3.  SKIN: No obvious rash, lesion, or ulcer.   LABORATORY PANEL:   CBC Recent Labs  Lab 11/20/17 1828  WBC 17.7*  HGB 14.5  HCT 43.3  PLT 226  MCV 85.0  MCH 28.4  MCHC 33.5  RDW 14.0  LYMPHSABS 0.9*  MONOABS 0.5  EOSABS 0.1  BASOSABS 0.0   ------------------------------------------------------------------------------------------------------------------  Chemistries  Recent Labs  Lab 11/20/17 2127  NA 141  K 2.4*  CL 118*  CO2 19*  GLUCOSE 91  BUN 8  CREATININE 0.59  CALCIUM 6.0*  AST 15  ALT 14  ALKPHOS 61  BILITOT 0.8   ------------------------------------------------------------------------------------------------------------------ estimated creatinine clearance is 119.6 mL/min (by C-G formula based on SCr of 0.59 mg/dL). ------------------------------------------------------------------------------------------------------------------ No results for input(s): TSH, T4TOTAL, T3FREE, THYROIDAB in the last 72 hours.  Invalid input(s): FREET3   Coagulation profile Recent Labs  Lab 11/20/17 2127  INR 1.36   ------------------------------------------------------------------------------------------------------------------- No results for input(s): DDIMER in the last 72  hours. -------------------------------------------------------------------------------------------------------------------  Cardiac Enzymes No results for input(s): CKMB, TROPONINI, MYOGLOBIN in the last 168 hours.  Invalid input(s): CK ------------------------------------------------------------------------------------------------------------------ Invalid input(s): POCBNP  ---------------------------------------------------------------------------------------------------------------  Urinalysis    Component Value Date/Time   COLORURINE YELLOW (A) 11/20/2017 2204   APPEARANCEUR CLOUDY (A) 11/20/2017 2204   APPEARANCEUR Hazy 08/06/2014 1847   LABSPEC 1.011 11/20/2017 2204   LABSPEC 1.016 08/06/2014 1847   PHURINE 6.0 11/20/2017 2204   GLUCOSEU NEGATIVE 11/20/2017 2204   GLUCOSEU Negative 08/06/2014 1847   HGBUR MODERATE (A) 11/20/2017 2204   BILIRUBINUR NEGATIVE 11/20/2017 2204   BILIRUBINUR Negative 04/01/2017 1500   BILIRUBINUR Negative 08/06/2014 1847   KETONESUR NEGATIVE 11/20/2017 2204   PROTEINUR 30 (A) 11/20/2017 2204   UROBILINOGEN 0.2 04/01/2017 1500   UROBILINOGEN 1.0 02/20/2013 1512   NITRITE POSITIVE (A) 11/20/2017 2204   LEUKOCYTESUR LARGE (A) 11/20/2017 2204   LEUKOCYTESUR 1+ 08/06/2014 1847     RADIOLOGY: Dg Chest 2 View  Result Date: 11/20/2017 CLINICAL DATA:  Abdominal pain. EXAM: CHEST  2 VIEW COMPARISON:  October 14, 2007 FINDINGS: Scoliotic curvature of the thoracic spine. The heart, hila, mediastinum, lungs, and pleura are otherwise unremarkable. IMPRESSION: No active cardiopulmonary disease. Electronically Signed   By: Gerome Samavid  Williams III M.D   On: 11/20/2017 19:11   Ct Abdomen Pelvis W Contrast  Result  Date: 11/20/2017 CLINICAL DATA:  Acute abdominal pain. Fever. Recent antibiotics for urinary tract infection. EXAM: CT ABDOMEN AND PELVIS WITH CONTRAST TECHNIQUE: Multidetector CT imaging of the abdomen and pelvis was performed using the standard  protocol following bolus administration of intravenous contrast. CONTRAST:  ISOVUE-300 IOPAMIDOL (ISOVUE-300) INJECTION 61% COMPARISON:  None. FINDINGS: Lower chest: Minimal dependent left lower lobe atelectasis. Hepatobiliary: 6 mm hypodensity in the right lobe of the liver too small to characterize, likely cyst or hemangioma. Gallbladder physiologically distended, no calcified stone. No biliary dilatation. Pancreas: No ductal dilatation or inflammation. Spleen: Normal in size without focal abnormality. Adrenals/Urinary Tract: No adrenal nodule. Right pyelonephritis with heterogeneous renal enhancement and ureteral thickening and enhancement. Mild right hydronephrosis. Mild perinephric and periureteric edema. A 13 mm low-density structure in the lower right kidney contains dependent calcification and may be a calyceal diverticulum. 15 mm low-density in the mid posterior right kidney and 11 mm low-density in the upper right kidney have adjacent cortical thinning and are likely additional caliceal diverticula. No perirenal abscess. Left pyelonephritis with heterogeneous enhancement and ureteral thickening and enhancement. Left upper pole scarring with parenchymal calcification. Mild left perinephric and periureteric edema. Possible calyceal diverticulum in the mid left kidney containing a calcification with adjacent cortical thinning. Additional scarring in the anterior lower left kidney with parenchymal calcifications. No perirenal abscess. Minimally distended urinary bladder with wall thickening and mild perivesicular stranding. Stomach/Bowel: Stomach is within normal limits. Appendix appears normal. No evidence of bowel wall thickening, distention, or inflammatory changes. Vascular/Lymphatic: Normal caliber abdominal aorta. Prominent retroperitoneal nodes are likely reactive. Reproductive: Retroverted uterus.  No adnexal mass. Other: Small amount of free fluid in the pelvis may be reactive or physiologic. No  free air. Musculoskeletal: Scoliotic curvature of the thoracolumbar spine. There are no acute or suspicious osseous abnormalities. IMPRESSION: 1. Urinary tract infection with cystitis and bilateral pyelonephritis. No renal or perirenal abscess. 2. Bilateral renal calyceal diverticula, some which contain stones. Mild left renal scarring. Electronically Signed   By: Rubye Oaks M.D.   On: 11/20/2017 23:12    EKG: Orders placed or performed during the hospital encounter of 11/20/17  . EKG 12-Lead  . EKG 12-Lead    IMPRESSION AND PLAN:  1.  Sepsis, secondary to bilateral pyelonephritis.  We will start IV fluid resuscitation and antibiotic, ceftriaxone, while waiting for urine and blood culture results.  Continue to monitor clinically closely.  Follow lactic acid level.  2.  Acute bilateral pyelonephritis, in a patient with recurrent UTIs.  Patient was supposed to follow-up with urology as outpatient for further evaluation, but she did not get the chance to do that yet.  No gross anatomic abnormalities noted per abdomen and pelvis CAT scan.  She is recommended again to follow-up with urology for further testing, as outpatient.  3.  Hypokalemia, will replace potassium per protocol.  All the records are reviewed and case discussed with ED provider. Management plans discussed with the patient, family and they are in agreement.  CODE STATUS:    Code Status Orders  (From admission, onward)        Start     Ordered   11/21/17 0248  Full code  Continuous     11/21/17 0247    Code Status History    Date Active Date Inactive Code Status Order ID Comments User Context   06/22/2012 01:42 06/23/2012 15:28 Full Code [id number]  Drue Second, RN Inpatient   06/20/2012 20:56 06/22/2012 00:55 Full Code [id number]  Rulon Abide Inpatient       TOTAL TIME TAKING CARE OF THIS PATIENT: 40 minutes.    Cammy Copa M.D on 11/21/2017 at 3:40 AM  Between 7am to 6pm - Pager - [phone]  After 6pm go  to [email or web address] - password [email or web address]  [facility]  Office  [phone]  CC: Primary care physician; Patient, No Pcp Per

## 2017-11-21 NOTE — Consult Note (Signed)
H&P Physician requesting consult: Cammy CopaAngela Maier, MD  Chief Complaint: Bilateral pyelonephritis  History of Present Illness: 27 year old female with a history of recurrent urinary tract infection since she was around the age of 27.  She presented to the emergency department with severe bilateral flank pain and fever.  She also had dysuria and hematuria that have been occurring for several days.  In the emergency department, she had evidence of sepsis.  She has been started on ceftriaxone.  CT scan of the abdomen and pelvis revealed mild right-sided hydronephrosis with  associated inflammation.  There was evidence of pyelonephritis bilaterally.  There were no ureteral calculi.  There are tiny calcifications within the kidney that may be intraparenchymal rather than within the collecting system.  Leukocytosis is improving.  Urine output has not been recorded.  She is now afebrile with vital signs stable.  Currently, the patient just awoke.  She had severe abdominal pain and went to void.  She had severe pain with voiding followed by severe bilateral flank pain and abdominal pain.  She was able to provide me with some personal history but for the most part she was not feeling up to providing history due to discomfort.  Her mother was present and provided some history for me.  The patient [facility]  She was apparently started on Macrobid UTI prophylaxis but had breakthrough infections.  Is no longer on the prophylaxis.  Prior urine cultures have shown Staphylococcus aureus that was pansensitive to all antibiotics tested.  Current blood culture is positive for gram-negative rods.  Speciation and sensitivities are pending.  She states she gets about 1 urinary tract infection a month.  These are not associated with sexual activity.  Past Medical History:  Diagnosis Date  . Anemia   . HSV-2 (herpes simplex virus 2) infection   . Trichomonas    Past Surgical History:  Procedure  Laterality Date  . WISDOM TOOTH EXTRACTION      Home Medications:  Medications Prior to Admission  Medication Sig Dispense Refill Last Dose  . Butalbital-APAP-Caff-Cod (FIORICET/CODEINE) 50-300-40-30 MG CAPS Take by mouth.   prn at prn  . fluconazole (DIFLUCAN) 150 MG tablet Take 150 mg by mouth once.  0 Completed Course at Unknown time  . metroNIDAZOLE (FLAGYL) 500 MG tablet Take 1 tablet (500 mg total) by mouth 2 (two) times daily. (Patient not taking: Reported on 11/20/2017) 14 tablet 0 Completed Course at Unknown time   Allergies:  Allergies  Allergen Reactions  . Penicillins Rash    "only with penicillin injection"    Family History  Problem Relation Age of Onset  . Hypertension Father   . Diabetes Father   . COPD Sister        born with RSV has immune problems with lung infections  . Stroke Maternal Uncle   . Hypertension Paternal Aunt   . Hypertension Maternal Grandfather   . Cancer Maternal Grandfather        lung  . Cancer Paternal Grandfather        lung  . Hypertension Paternal Aunt   . Cancer Maternal Grandmother        lung cancer  . Stroke Paternal Grandmother    Social History:  reports that she has quit smoking. Her smoking use included e-cigarettes and cigarettes. she has never used smokeless tobacco. She reports that she drinks alcohol. She reports that she does not use drugs.  ROS: A complete review of systems was performed.  All systems are negative except for pertinent findings as noted. ROS   Physical Exam:  Vital signs in last 24 hours: Temp:  [98.1 F (36.7 C)-103.3 F (39.6 C)] 98.1 F (36.7 C) (02/17 0819) Pulse Rate:  [66-120] 66 (02/17 0819) Resp:  [16-20] 18 (02/17 0251) BP: (118-142)/(61-95) 118/61 (02/17 0819) SpO2:  [97 %-100 %] 100 % (02/17 0819) Weight:  [85.7 kg (189 lb)-87.2 kg (192 lb 4.8 oz)] 87.2 kg (192 lb 4.8 oz) (02/17 0255)  General: Mild to moderate distress secondary to pain/discomfort HEENT: Normocephalic,  atraumatic Cardiovascular: Regular rate and rhythm, evidence of adequate perfusion Lungs: Regular rate and effort Abdomen: Soft, nondistended, mildly tender to palpation diffusely Back: Bilateral CVA tenderness Extremities: No edema Neurologic: Grossly intact  Laboratory Data:  Results for orders placed or performed during the hospital encounter of 11/20/17 (from the past 24 hour(s))  Lactic acid, plasma     Status: Abnormal   Collection Time: 11/20/17  6:28 PM  Result Value Ref Range   Lactic Acid, Venous 2.4 (HH) 0.5 - 1.9 mmol/L  CBC with Differential     Status: Abnormal   Collection Time: 11/20/17  6:28 PM  Result Value Ref Range   WBC 17.7 (H) 3.6 - 11.0 K/uL   RBC 5.10 3.80 - 5.20 MIL/uL   Hemoglobin 14.5 12.0 - 16.0 g/dL   HCT 16.1 09.6 - 04.5 %   MCV 85.0 80.0 - 100.0 fL   MCH 28.4 26.0 - 34.0 pg   MCHC 33.5 32.0 - 36.0 g/dL   RDW 40.9 81.1 - 91.4 %   Platelets 226 150 - 440 K/uL   Neutrophils Relative % 92 %   Neutro Abs 16.2 (H) 1.4 - 6.5 K/uL   Lymphocytes Relative 5 %   Lymphs Abs 0.9 (L) 1.0 - 3.6 K/uL   Monocytes Relative 3 %   Monocytes Absolute 0.5 0.2 - 0.9 K/uL   Eosinophils Relative 0 %   Eosinophils Absolute 0.1 0 - 0.7 K/uL   Basophils Relative 0 %   Basophils Absolute 0.0 0 - 0.1 K/uL  Culture, blood (Routine x 2)     Status: None (Preliminary result)   Collection Time: 11/20/17  6:28 PM  Result Value Ref Range   Specimen Description BLOOD LEFT HAND    Special Requests      BOTTLES DRAWN AEROBIC AND ANAEROBIC Blood Culture results may not be optimal due to an excessive volume of blood received in culture bottles   Culture      NO GROWTH < 12 HOURS Performed at [facility], [address] [place], [place] [address]    Report Status PENDING   Culture, blood (Routine x 2)     Status: None (Preliminary result)   Collection Time: 11/20/17  9:27 PM  Result Value Ref Range   Specimen Description BLOOD LEFT ANTECUBITAL    Special Requests       BOTTLES DRAWN AEROBIC AND ANAEROBIC Blood Culture results may not be optimal due to an excessive volume of blood received in culture bottles   Culture  Setup Time Organism ID to follow    Culture      NO GROWTH < 12 HOURS Performed at [facility], [address] [place], [place] [address]    Report Status PENDING   Protime-INR     Status: Abnormal   Collection Time: 11/20/17  9:27 PM  Result Value Ref Range   Prothrombin Time 16.7 (H) 11.4 - 15.2 seconds  INR 1.36   Comprehensive metabolic panel     Status: Abnormal   Collection Time: 11/20/17  9:27 PM  Result Value Ref Range   Sodium 141 135 - 145 mmol/L   Potassium 2.4 (LL) 3.5 - 5.1 mmol/L   Chloride 118 (H) 101 - 111 mmol/L   CO2 19 (L) 22 - 32 mmol/L   Glucose, Bld 91 65 - 99 mg/dL   BUN 8 6 - 20 mg/dL   Creatinine, Ser 1.61 0.44 - 1.00 mg/dL   Calcium 6.0 (LL) 8.9 - 10.3 mg/dL   Total Protein 5.2 (L) 6.5 - 8.1 g/dL   Albumin 2.7 (L) 3.5 - 5.0 g/dL   AST 15 15 - 41 U/L   ALT 14 14 - 54 U/L   Alkaline Phosphatase 61 38 - 126 U/L   Total Bilirubin 0.8 0.3 - 1.2 mg/dL   GFR calc non Af Amer >60 >60 mL/min   GFR calc Af Amer >60 >60 mL/min   Anion gap 4 (L) 5 - 15  hCG, quantitative, pregnancy     Status: None   Collection Time: 11/20/17  9:27 PM  Result Value Ref Range   hCG, Beta Chain, Quant, S <1 <5 mIU/mL  Urinalysis, Complete w Microscopic     Status: Abnormal   Collection Time: 11/20/17 10:04 PM  Result Value Ref Range   Color, Urine YELLOW (A) YELLOW   APPearance CLOUDY (A) CLEAR   Specific Gravity, Urine 1.011 1.005 - 1.030   pH 6.0 5.0 - 8.0   Glucose, UA NEGATIVE NEGATIVE mg/dL   Hgb urine dipstick MODERATE (A) NEGATIVE   Bilirubin Urine NEGATIVE NEGATIVE   Ketones, ur NEGATIVE NEGATIVE mg/dL   Protein, ur 30 (A) NEGATIVE mg/dL   Nitrite POSITIVE (A) NEGATIVE   Leukocytes, UA LARGE (A) NEGATIVE   RBC / HPF 6-30 0 - 5 RBC/hpf   WBC, UA TOO NUMEROUS TO COUNT 0 - 5 WBC/hpf   Bacteria, UA  MANY (A) NONE SEEN   Squamous Epithelial / LPF 6-30 (A) NONE SEEN   WBC Clumps PRESENT    Mucus PRESENT   Pregnancy, urine POC     Status: None   Collection Time: 11/20/17 10:15 PM  Result Value Ref Range   Preg Test, Ur NEGATIVE NEGATIVE  Lactic acid, plasma     Status: None   Collection Time: 11/20/17 10:20 PM  Result Value Ref Range   Lactic Acid, Venous 1.0 0.5 - 1.9 mmol/L  Basic metabolic panel     Status: Abnormal   Collection Time: 11/21/17  4:18 AM  Result Value Ref Range   Sodium 138 135 - 145 mmol/L   Potassium 3.3 (L) 3.5 - 5.1 mmol/L   Chloride 108 101 - 111 mmol/L   CO2 25 22 - 32 mmol/L   Glucose, Bld 151 (H) 65 - 99 mg/dL   BUN 10 6 - 20 mg/dL   Creatinine, Ser 0.96 0.44 - 1.00 mg/dL   Calcium 7.8 (L) 8.9 - 10.3 mg/dL   GFR calc non Af Amer >60 >60 mL/min   GFR calc Af Amer >60 >60 mL/min   Anion gap 5 5 - 15  CBC     Status: Abnormal   Collection Time: 11/21/17  4:18 AM  Result Value Ref Range   WBC 12.7 (H) 3.6 - 11.0 K/uL   RBC 4.27 3.80 - 5.20 MIL/uL   Hemoglobin 12.6 12.0 - 16.0 g/dL   HCT 04.5 40.9 - 81.1 %   MCV  86.0 80.0 - 100.0 fL   MCH 29.4 26.0 - 34.0 pg   MCHC 34.2 32.0 - 36.0 g/dL   RDW 16.1 09.6 - 04.5 %   Platelets 200 150 - 440 K/uL  Magnesium     Status: None   Collection Time: 11/21/17  4:18 AM  Result Value Ref Range   Magnesium 1.7 1.7 - 2.4 mg/dL  Glucose, capillary     Status: None   Collection Time: 11/21/17  8:00 AM  Result Value Ref Range   Glucose-Capillary 97 65 - 99 mg/dL   Comment 1 Notify RN    Recent Results (from the past 240 hour(s))  Culture, blood (Routine x 2)     Status: None (Preliminary result)   Collection Time: 11/20/17  6:28 PM  Result Value Ref Range Status   Specimen Description BLOOD LEFT HAND  Final   Special Requests   Final    BOTTLES DRAWN AEROBIC AND ANAEROBIC Blood Culture results may not be optimal due to an excessive volume of blood received in culture bottles   Culture   Final    NO GROWTH <  12 HOURS Performed at [facility], [address], [place], [place] [address]    Report Status PENDING  Incomplete  Culture, blood (Routine x 2)     Status: None (Preliminary result)   Collection Time: 11/20/17  9:27 PM  Result Value Ref Range Status   Specimen Description BLOOD LEFT ANTECUBITAL  Final   Special Requests   Final    BOTTLES DRAWN AEROBIC AND ANAEROBIC Blood Culture results may not be optimal due to an excessive volume of blood received in culture bottles   Culture  Setup Time Organism ID to follow  Final   Culture   Final    NO GROWTH < 12 HOURS Performed at [facility], [address], [place], [place] [address]    Report Status PENDING  Incomplete   Creatinine: Recent Labs    11/20/17 2127 11/21/17 0418  CREATININE 0.59 0.86   CT abdomen and pelvis: Adrenals/Urinary Tract: No adrenal nodule.  Right pyelonephritis with heterogeneous renal enhancement and ureteral thickening and enhancement. Mild right hydronephrosis. Mild perinephric and periureteric edema. A 13 mm low-density structure in the lower right kidney contains dependent calcification and may be a calyceal diverticulum. 15 mm low-density in the mid posterior right kidney and 11 mm low-density in the upper right kidney have adjacent cortical thinning and are likely additional caliceal diverticula. No perirenal abscess.  Left pyelonephritis with heterogeneous enhancement and ureteral thickening and enhancement. Left upper pole scarring with parenchymal calcification. Mild left perinephric and periureteric edema. Possible calyceal diverticulum in the mid left kidney containing a calcification with adjacent cortical thinning. Additional scarring in the anterior lower left kidney with parenchymal calcifications. No perirenal abscess.  Minimally distended urinary bladder with wall thickening and mild perivesicular stranding.  Impression/Assessment:  1. Sepsis secondary to  bilateral pyelonephritis and gram-negative rod bacteremia--agree with broad-spectrum antibiotics as you are doing and to narrow once speciation and sensitivities have returned.  2.  Recommend outpatient follow-up with a urologist.  Can consider a VCUG once infection has cleared to evaluate for ureteral reflux.  Can also consider going back on UTI prophylaxis.  Since nitrofurantoin failed, low-dose Bactrim is another option.  Would not start this until infection has completely cleared.  Can also consider an outpatient cystoscopy.    Ray Church, III 11/21/2017, 12:08 PM

## 2017-11-21 NOTE — ED Notes (Signed)
Noel RN, aware of bed assigned  

## 2017-11-21 NOTE — Progress Notes (Signed)
Received report from ED nurse Danelle EarthlyNoel, RN pt temp at 2228 101.1 no intervention at that time, K+ 2.4 with no intervention, ED nurse stated would check to make sure MD aware of values

## 2017-11-21 NOTE — Progress Notes (Signed)
Chaplain provided patient and mother with an AD booklet and explained the components and options. The AD [facility] be [facility] off[facility]site and [facility]. If further assistance is needed, Chaplain told the patient to page the on-call chaplain.

## 2017-11-22 LAB — BASIC METABOLIC PANEL
Anion gap: 7 (ref 5–15)
BUN: 5 mg/dL — AB (ref 6–20)
CALCIUM: 8.1 mg/dL — AB (ref 8.9–10.3)
CHLORIDE: 109 mmol/L (ref 101–111)
CO2: 22 mmol/L (ref 22–32)
CREATININE: 0.64 mg/dL (ref 0.44–1.00)
GFR calc non Af Amer: >60 mL/min (ref >60)
Glucose, Bld: 115 mg/dL — ABNORMAL HIGH (ref 65–99)
Potassium: 3.6 mmol/L (ref 3.5–5.1)
SODIUM: 138 mmol/L (ref 135–145)

## 2017-11-22 LAB — GLUCOSE, CAPILLARY: GLUCOSE-CAPILLARY: 108 mg/dL — AB (ref 65–99)

## 2017-11-22 LAB — CBC
HCT: 37.4 % (ref 35.0–47.0)
Hemoglobin: 12.7 g/dL (ref 12.0–16.0)
MCH: 29.4 pg (ref 26.0–34.0)
MCHC: 34 g/dL (ref 32.0–36.0)
MCV: 86.5 fL (ref 80.0–100.0)
PLATELETS: 154 10*3/uL (ref 150–440)
RBC: 4.33 MIL/uL (ref 3.80–5.20)
RDW: 13.7 % (ref 11.5–14.5)
WBC: 5.4 10*3/uL (ref 3.6–11.0)

## 2017-11-22 MED ORDER — IBUPROFEN 400 MG PO TABS
400.0000 mg | ORAL_TABLET | Freq: Four times a day (QID) | ORAL | Status: DC | PRN
  Administered 2017-11-22: 400 mg via ORAL

## 2017-11-22 NOTE — Progress Notes (Signed)
[employer] - [place] at [facility]   PATIENT NAME: Kylie Martin    MR#:  [id number]  DATE OF BIRTH:  09-27-1991  SUBJECTIVE:  CHIEF COMPLAINT:   Chief Complaint  Patient presents with  . 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  Had fever again, noted to have bacteremia. Tolerating diet.  REVIEW OF SYSTEMS:  CONSTITUTIONAL: No fever, fatigue or weakness.  EYES: No blurred or double vision.  EARS, NOSE, AND THROAT: No tinnitus or ear pain.  RESPIRATORY: No cough, shortness of breath, wheezing or hemoptysis.  CARDIOVASCULAR: No chest pain, orthopnea, edema.  GASTROINTESTINAL: No nausea, vomiting, diarrhea or abdominal pain.  GENITOURINARY: No dysuria, hematuria.  ENDOCRINE: No polyuria, nocturia,  HEMATOLOGY: No anemia, easy bruising or bleeding SKIN: No rash or lesion. MUSCULOSKELETAL: No joint pain or arthritis.   NEUROLOGIC: No tingling, numbness, weakness.  PSYCHIATRY: No anxiety or depression.   ROS  DRUG ALLERGIES:   Allergies  Allergen Reactions  . Penicillins Rash    "only with penicillin injection"    VITALS:  Blood pressure 129/70, pulse 94, temperature 100.1 F (37.8 C), temperature source Oral, resp. rate 16, height 5\' 6"  (1.676 m), weight 87.2 kg (192 lb 4.8 oz), last menstrual period 11/04/2017, SpO2 98 %.  PHYSICAL EXAMINATION:  GENERAL:  27 y.o.-year-old patient lying in the bed with no acute distress.  EYES: Pupils equal, round, reactive to light and accommodation. No scleral icterus. Extraocular muscles intact.  HEENT: Head atraumatic, normocephalic. Oropharynx and nasopharynx clear.  NECK:  Supple, no jugular venous distention. No thyroid enlargement, no tenderness.  LUNGS: Normal breath sounds bilaterally, no wheezing, rales,rhonchi or crepitation. No use of accessory muscles of respiration.  CARDIOVASCULAR: S1, S2 normal. No murmurs, rubs, or gallops.   ABDOMEN: Soft, nontender, nondistended. Bowel sounds present. No organomegaly or mass. Bilateral costovertebral angle tenderness.  EXTREMITIES: No pedal edema, cyanosis, or clubbing.  NEUROLOGIC: Cranial nerves II through XII are intact. Muscle strength 5/5 in all extremities. Sensation intact. Gait not checked.  PSYCHIATRIC: The patient is alert and oriented x 3.  SKIN: No obvious rash, lesion, or ulcer.   Physical Exam LABORATORY PANEL:   CBC Recent Labs  Lab 11/22/17 0325  WBC 5.4  HGB 12.7  HCT 37.4  PLT 154   ------------------------------------------------------------------------------------------------------------------  Chemistries  Recent Labs  Lab 11/20/17 2127 11/21/17 0418  11/22/17 0325  NA 141 138  --  138  K 2.4* 3.3*   < > 3.6  CL 118* 108  --  109  CO2 19* 25  --  22  GLUCOSE 91 151*  --  115*  BUN 8 10  --  5*  CREATININE 0.59 0.86  --  0.64  CALCIUM 6.0* 7.8*  --  8.1*  MG  --  1.7  --   --   AST 15  --   --   --   ALT 14  --   --   --   ALKPHOS 61  --   --   --   BILITOT 0.8  --   --   --    < > = values in this interval not displayed.   ------------------------------------------------------------------------------------------------------------------  Cardiac Enzymes No results for input(s): TROPONINI in the last 168 hours. ------------------------------------------------------------------------------------------------------------------  RADIOLOGY:  Dg Chest 2 View  Result Date: 11/20/2017 CLINICAL DATA:  Abdominal pain. EXAM: CHEST  2 VIEW COMPARISON:  October 14, 2007  FINDINGS: Scoliotic curvature of the thoracic spine. The heart, hila, mediastinum, lungs, and pleura are otherwise unremarkable. IMPRESSION: No active cardiopulmonary disease. Electronically Signed   By: Gerome Sam III M.D   On: 11/20/2017 19:11   Ct Abdomen Pelvis W Contrast  Result Date: 11/20/2017 CLINICAL DATA:  Acute abdominal pain. Fever. Recent antibiotics for  urinary tract infection. EXAM: CT ABDOMEN AND PELVIS WITH CONTRAST TECHNIQUE: Multidetector CT imaging of the abdomen and pelvis was performed using the standard protocol following bolus administration of intravenous contrast. CONTRAST:  ISOVUE-300 IOPAMIDOL (ISOVUE-300) INJECTION 61% COMPARISON:  None. FINDINGS: Lower chest: Minimal dependent left lower lobe atelectasis. Hepatobiliary: 6 mm hypodensity in the right lobe of the liver too small to characterize, likely cyst or hemangioma. Gallbladder physiologically distended, no calcified stone. No biliary dilatation. Pancreas: No ductal dilatation or inflammation. Spleen: Normal in size without focal abnormality. Adrenals/Urinary Tract: No adrenal nodule. Right pyelonephritis with heterogeneous renal enhancement and ureteral thickening and enhancement. Mild right hydronephrosis. Mild perinephric and periureteric edema. A 13 mm low-density structure in the lower right kidney contains dependent calcification and may be a calyceal diverticulum. 15 mm low-density in the mid posterior right kidney and 11 mm low-density in the upper right kidney have adjacent cortical thinning and are likely additional caliceal diverticula. No perirenal abscess. Left pyelonephritis with heterogeneous enhancement and ureteral thickening and enhancement. Left upper pole scarring with parenchymal calcification. Mild left perinephric and periureteric edema. Possible calyceal diverticulum in the mid left kidney containing a calcification with adjacent cortical thinning. Additional scarring in the anterior lower left kidney with parenchymal calcifications. No perirenal abscess. Minimally distended urinary bladder with wall thickening and mild perivesicular stranding. Stomach/Bowel: Stomach is within normal limits. Appendix appears normal. No evidence of bowel wall thickening, distention, or inflammatory changes. Vascular/Lymphatic: Normal caliber abdominal aorta. Prominent retroperitoneal  nodes are likely reactive. Reproductive: Retroverted uterus.  No adnexal mass. Other: Small amount of free fluid in the pelvis may be reactive or physiologic. No free air. Musculoskeletal: Scoliotic curvature of the thoracolumbar spine. There are no acute or suspicious osseous abnormalities. IMPRESSION: 1. Urinary tract infection with cystitis and bilateral pyelonephritis. No renal or perirenal abscess. 2. Bilateral renal calyceal diverticula, some which contain stones. Mild left renal scarring. Electronically Signed   By: Rubye Oaks M.D.   On: 11/20/2017 23:12    ASSESSMENT AND PLAN:   Active Problems:   Sepsis (HCC)  *  Sepsis, secondary to bilateral pyelonephritis.    IV fluid resuscitation and antibiotic, ceftriaxone,    urine and blood culture    Urology consult due to recurrent UTI.   * Bacteremia- gram negative rods.   Change from Rocephin to meropenem IV.   She had multiple UTIs with staphylococci in the past.   negative echocardiogram to rule out endocarditis, called ID consult.    *  Acute bilateral pyelonephritis, in a patient with recurrent UTIs.  Patient was supposed to follow-up with urology as outpatient for further evaluation, but she did not get the chance to do that yet.  No gross anatomic abnormalities noted per abdomen and pelvis CAT scan.  She is recommended again to follow-up with urology for further testing, as outpatient.   *  Hypokalemia, will replace potassium per protocol.  * hypomagnesemia   replace IV  * hypocalcemia   Due to hypoalbuminemia.   Replace magnesium, and would suggest good dietary habits.    All the records are reviewed and case discussed with Care Management/Social Workerr. Management plans  discussed with the patient, family and they are in agreement.  CODE STATUS: full.  TOTAL TIME TAKING CARE OF THIS PATIENT: 35 minutes.   Discussed with patient and her mother, father and friends in the room.  POSSIBLE D/C IN 1-2 DAYS,  DEPENDING ON CLINICAL CONDITION.   Altamese DillingVaibhavkumar Carolos Fecher M.D on 11/22/2017   Between 7am to 6pm - Pager - [phone]  After 6pm go to [email or web address] - password [email or web address]  [facility]  Office  [phone]  CC: Primary care physician; Patient, No Pcp Per  Note: This dictation was prepared with Dragon dictation along with smaller phrase technology. Any transcriptional errors that result from this process are unintentional.

## 2017-11-22 NOTE — Consult Note (Signed)
[facility] Infectious Disease     Reason for Consult: Recurrent UTI   Referring Physician: Doylene Canning Date of Admission:  11/20/2017   Active Problems:   Sepsis ([place])   HPI: Kylie Martin is a 27 y.o. female with a long history of recurrent UTIs and recurrent stones who is admitted with fevers and bilateral flank pain. Found to have E coli UTI, bacteremia and CT showed bil pyelonephritis. She has been seen by urology. Previously assessed at [facility] by urology and was placed on macrobid PPX but is no longer on it.  Prior cultures have grown MSSA several times.    Past Medical History:  Diagnosis Date  . Anemia   . HSV-2 (herpes simplex virus 2) infection   . Trichomonas    Past Surgical History:  Procedure Laterality Date  . WISDOM TOOTH EXTRACTION     Social History   Tobacco Use  . Smoking status: Former Smoker    Types: E-cigarettes, Cigarettes  . Smokeless tobacco: Never Used  Substance Use Topics  . Alcohol use: Yes    Comment: socially  . Drug use: No   Family History  Problem Relation Age of Onset  . Hypertension Father   . Diabetes Father   . COPD Sister        born with RSV has immune problems with lung infections  . Stroke Maternal Uncle   . Hypertension Paternal Aunt   . Hypertension Maternal Grandfather   . Cancer Maternal Grandfather        lung  . Cancer Paternal Grandfather        lung  . Hypertension Paternal Aunt   . Cancer Maternal Grandmother        lung cancer  . Stroke Paternal Grandmother     Allergies:  Allergies  Allergen Reactions  . Penicillins Rash    "only with penicillin injection"    Current antibiotics: Antibiotics Given (last 72 hours)    Date/Time Action Medication Dose Rate   11/20/17 2308 New Bag/Given   cefTRIAXone (ROCEPHIN) 2 g in sodium chloride 0.9 % 100 mL IVPB 2 g 200 mL/hr   11/21/17 0819 New Bag/Given   cefTRIAXone (ROCEPHIN) 2 g in sodium chloride 0.9 % 100 mL IVPB 2 g 200 mL/hr   11/21/17 1355 New  Bag/Given   meropenem (MERREM) 1 g in sodium chloride 0.9 % 100 mL IVPB 1 g 200 mL/hr   11/21/17 2146 New Bag/Given   meropenem (MERREM) 1 g in sodium chloride 0.9 % 100 mL IVPB 1 g 200 mL/hr   11/22/17 0627 New Bag/Given   meropenem (MERREM) 1 g in sodium chloride 0.9 % 100 mL IVPB 1 g 200 mL/hr      MEDICATIONS: . calcium-vitamin D  1 tablet Oral BID  . docusate sodium  100 mg Oral BID  . heparin  5,000 Units Subcutaneous Q8H  . Influenza vac split quadrivalent PF  0.5 mL Intramuscular Tomorrow-1000    Review of Systems - 11 systems reviewed and negative per HPI   OBJECTIVE: Temp:  [98.1 F (36.7 C)-102.9 F (39.4 C)] 100.1 F (37.8 C) (02/18 1111) Pulse Rate:  [94-111] 94 (02/18 0357) Resp:  [16] 16 (02/18 0357) BP: (121-138)/(63-70) 129/70 (02/18 0357) SpO2:  [98 %-99 %] 98 % (02/18 0357) Physical Exam  Constitutional:  Sleepy  appears well-developed and well-nourished. No distress.  HENT: [place]/AT, PERRLA, no scleral icterus Mouth/Throat: Oropharynx is clear and moist. No oropharyngeal exudate.  Cardiovascular: Normal rate, regular rhythm and  normal heart sounds. Exam reveals no gallop and no friction rub.  No murmur heard.  Pulmonary/Chest: Effort normal and breath sounds normal. No respiratory distress.  has no wheezes.  Neck = supple, no nuchal rigidity Abdominal: Soft. Bowel sounds are normal.  exhibits no distension. There is no tenderness.  Lymphadenopathy: no cervical adenopathy. No axillary adenopathy Neurological: alert and oriented to person, place, and time.  Skin: Skin is warm and dry. No rash noted. No erythema.  Psychiatric: a normal mood and affect.  behavior is normal.    LABS: Results for orders placed or performed during the hospital encounter of 11/20/17 (from the past 48 hour(s))  Lactic acid, plasma     Status: Abnormal   Collection Time: 11/20/17  6:28 PM  Result Value Ref Range   Lactic Acid, Venous 2.4 (HH) 0.5 - 1.9 mmol/L    Comment:  CRITICAL RESULT CALLED TO, READ BACK BY AND VERIFIED WITH NOEL WEBSTER AT 1916 11/20/17.PMH Performed at [facility], [address] [place], [place] [address]   CBC with Differential     Status: Abnormal   Collection Time: 11/20/17  6:28 PM  Result Value Ref Range   WBC 17.7 (H) 3.6 - 11.0 K/uL   RBC 5.10 3.80 - 5.20 MIL/uL   Hemoglobin 14.5 12.0 - 16.0 g/dL   HCT 43.3 35.0 - 47.0 %   MCV 85.0 80.0 - 100.0 fL   MCH 28.4 26.0 - 34.0 pg   MCHC 33.5 32.0 - 36.0 g/dL   RDW 14.0 11.5 - 14.5 %   Platelets 226 150 - 440 K/uL   Neutrophils Relative % 92 %   Neutro Abs 16.2 (H) 1.4 - 6.5 K/uL   Lymphocytes Relative 5 %   Lymphs Abs 0.9 (L) 1.0 - 3.6 K/uL   Monocytes Relative 3 %   Monocytes Absolute 0.5 0.2 - 0.9 K/uL   Eosinophils Relative 0 %   Eosinophils Absolute 0.1 0 - 0.7 K/uL   Basophils Relative 0 %   Basophils Absolute 0.0 0 - 0.1 K/uL    Comment: Performed at [facility], [address] [place], [place] [address]  Culture, blood (Routine x 2)     Status: None (Preliminary result)   Collection Time: 11/20/17  6:28 PM  Result Value Ref Range   Specimen Description      BLOOD LEFT HAND Performed at [facility], [address], [place], [place] [address]    Special Requests      BOTTLES DRAWN AEROBIC AND ANAEROBIC Blood Culture results may not be optimal due to an excessive volume of blood received in culture bottles Performed at [facility], [address] [place], [place] [address]    Culture  Setup Time      GRAM NEGATIVE RODS AEROBIC BOTTLE ONLY CRITICAL RESULT CALLED TO, READ BACK BY AND VERIFIED WITH: STEPHANIE SHUDER 11/21/17 @ 20  [place]    Culture GRAM NEGATIVE RODS    Report Status PENDING   Urine culture     Status: Abnormal (Preliminary result)   Collection Time: 11/20/17  8:04 PM  Result Value Ref Range   Specimen Description      URINE, RANDOM Performed at [facility], [address],  [place], [place] [address]    Special Requests      NONE Performed at [facility], [address], [place], [place] [address]    Culture >=100,000 COLONIES/mL ESCHERICHIA COLI (A)    Report Status PENDING  Culture, blood (Routine x 2)     Status: Abnormal (Preliminary result)   Collection Time: 11/20/17  9:27 PM  Result Value Ref Range   Specimen Description      BLOOD LEFT ANTECUBITAL Performed at [facility], [address] [place], [place] [address]    Special Requests      BOTTLES DRAWN AEROBIC AND ANAEROBIC Blood Culture results may not be optimal due to an excessive volume of blood received in culture bottles Performed at [facility], [address] [place], [place] [address]    Culture  Setup Time      GRAM NEGATIVE RODS IN BOTH AEROBIC AND ANAEROBIC BOTTLES CRITICAL RESULT CALLED TO, READ BACK BY AND VERIFIED WITH: STEPHANIE SHUDER 11/21/17 @ 16  [place]    Culture (A)     ESCHERICHIA COLI SUSCEPTIBILITIES TO FOLLOW Performed at [facility], [place] [address], [place], [place] [address]    Report Status PENDING   Protime-INR     Status: Abnormal   Collection Time: 11/20/17  9:27 PM  Result Value Ref Range   Prothrombin Time 16.7 (H) 11.4 - 15.2 seconds   INR 1.36     Comment: Performed at [facility], [address] [place], [place] [address]  Comprehensive metabolic panel     Status: Abnormal   Collection Time: 11/20/17  9:27 PM  Result Value Ref Range   Sodium 141 135 - 145 mmol/L   Potassium 2.4 (LL) 3.5 - 5.1 mmol/L    Comment: CRITICAL RESULT CALLED TO, READ BACK BY AND VERIFIED WITH NOEL WEBSTER AT 2216 ON 11/20/17 RWW    Chloride 118 (H) 101 - 111 mmol/L   CO2 19 (L) 22 - 32 mmol/L   Glucose, Bld 91 65 - 99 mg/dL   BUN 8 6 - 20 mg/dL   Creatinine, Ser 0.59 0.44 - 1.00 mg/dL   Calcium 6.0 (LL) 8.9 - 10.3 mg/dL    Comment: CRITICAL RESULT CALLED TO, READ BACK BY AND VERIFIED WITH NOEL WEBSTER AT 2216 ON 11/20/17  RWW    Total Protein 5.2 (L) 6.5 - 8.1 g/dL   Albumin 2.7 (L) 3.5 - 5.0 g/dL   AST 15 15 - 41 U/L   ALT 14 14 - 54 U/L   Alkaline Phosphatase 61 38 - 126 U/L   Total Bilirubin 0.8 0.3 - 1.2 mg/dL   GFR calc non Af Amer >60 >60 mL/min   GFR calc Af Amer >60 >60 mL/min    Comment: (NOTE) The eGFR has been calculated using the CKD EPI equation. This calculation has not been validated in all clinical situations. eGFR's persistently <60 mL/min signify possible Chronic Kidney Disease.    Anion gap 4 (L) 5 - 15    Comment: Performed at [facility], [address] [place],  [address]  hCG, quantitative, pregnancy     Status: None   Collection Time: 11/20/17  9:27 PM  Result Value Ref Range   hCG, Beta Chain, Quant, S <1 <5 mIU/mL    Comment:          GEST. AGE      CONC.  (mIU/mL)   <=1 WEEK        5 - 50     2 WEEKS       50 - 500     3 WEEKS       100 - 10,000     4 WEEKS     1,000 -  30,000     5 WEEKS     3,500 - 115,000   6-8 WEEKS     12,000 - 270,000    12 WEEKS     15,000 - 220,000        FEMALE AND NON-PREGNANT FEMALE:     LESS THAN 5 mIU/mL Performed at [facility], [address] [place], [place] [address]   Blood Culture ID Panel (Reflexed)     Status: Abnormal   Collection Time: 11/20/17  9:27 PM  Result Value Ref Range   Enterococcus species NOT DETECTED NOT DETECTED   Listeria monocytogenes NOT DETECTED NOT DETECTED   Staphylococcus species NOT DETECTED NOT DETECTED   Staphylococcus aureus NOT DETECTED NOT DETECTED   Streptococcus species NOT DETECTED NOT DETECTED   Streptococcus agalactiae NOT DETECTED NOT DETECTED   Streptococcus pneumoniae NOT DETECTED NOT DETECTED   Streptococcus pyogenes NOT DETECTED NOT DETECTED   Acinetobacter baumannii NOT DETECTED NOT DETECTED   Enterobacteriaceae species DETECTED (A) NOT DETECTED    Comment: Enterobacteriaceae represent a large family of gram-negative bacteria, not a single  organism. CRITICAL RESULT CALLED TO, READ BACK BY AND VERIFIED WITH: STEPHANIE SHUDER 11/21/17 @ 1209  [employer]    Enterobacter cloacae complex NOT DETECTED NOT DETECTED   Escherichia coli DETECTED (A) NOT DETECTED    Comment: CRITICAL RESULT CALLED TO, READ BACK BY AND VERIFIED WITH: STEPHANIE SHUDER 11/21/17 @ 1209  [place]    Klebsiella oxytoca NOT DETECTED NOT DETECTED   Klebsiella pneumoniae NOT DETECTED NOT DETECTED   Proteus species NOT DETECTED NOT DETECTED   Serratia marcescens NOT DETECTED NOT DETECTED   Carbapenem resistance NOT DETECTED NOT DETECTED   Haemophilus influenzae NOT DETECTED NOT DETECTED   Neisseria meningitidis NOT DETECTED NOT DETECTED   Pseudomonas aeruginosa NOT DETECTED NOT DETECTED   Candida albicans NOT DETECTED NOT DETECTED   Candida glabrata NOT DETECTED NOT DETECTED   Candida krusei NOT DETECTED NOT DETECTED   Candida parapsilosis NOT DETECTED NOT DETECTED   Candida tropicalis NOT DETECTED NOT DETECTED    Comment: Performed at [facility], [address] [place], [place] [address]  Urinalysis, Complete w Microscopic     Status: Abnormal   Collection Time: 11/20/17 10:04 PM  Result Value Ref Range   Color, Urine YELLOW (A) YELLOW   APPearance CLOUDY (A) CLEAR   Specific Gravity, Urine 1.011 1.005 - 1.030   pH 6.0 5.0 - 8.0   Glucose, UA NEGATIVE NEGATIVE mg/dL   Hgb urine dipstick MODERATE (A) NEGATIVE   Bilirubin Urine NEGATIVE NEGATIVE   Ketones, ur NEGATIVE NEGATIVE mg/dL   Protein, ur 30 (A) NEGATIVE mg/dL   Nitrite POSITIVE (A) NEGATIVE   Leukocytes, UA LARGE (A) NEGATIVE   RBC / HPF 6-30 0 - 5 RBC/hpf   WBC, UA TOO NUMEROUS TO COUNT 0 - 5 WBC/hpf   Bacteria, UA MANY (A) NONE SEEN   Squamous Epithelial / LPF 6-30 (A) NONE SEEN   WBC Clumps PRESENT    Mucus PRESENT     Comment: Performed at [facility], [address] [place], [place] [address]  Pregnancy, urine POC     Status: None   Collection Time: 11/20/17 10:15 PM   Result Value Ref Range   Preg Test, Ur NEGATIVE NEGATIVE    Comment:        THE SENSITIVITY OF THIS METHODOLOGY IS >24 mIU/mL   Lactic acid, plasma     Status: None   Collection Time:  11/20/17 10:20 PM  Result Value Ref Range   Lactic Acid, Venous 1.0 0.5 - 1.9 mmol/L    Comment: Performed at [facility], [address] [place], [place] [address]  Basic metabolic panel     Status: Abnormal   Collection Time: 11/21/17  4:18 AM  Result Value Ref Range   Sodium 138 135 - 145 mmol/L   Potassium 3.3 (L) 3.5 - 5.1 mmol/L   Chloride 108 101 - 111 mmol/L   CO2 25 22 - 32 mmol/L   Glucose, Bld 151 (H) 65 - 99 mg/dL   BUN 10 6 - 20 mg/dL   Creatinine, Ser 0.86 0.44 - 1.00 mg/dL   Calcium 7.8 (L) 8.9 - 10.3 mg/dL   GFR calc non Af Amer >60 >60 mL/min   GFR calc Af Amer >60 >60 mL/min    Comment: (NOTE) The eGFR has been calculated using the CKD EPI equation. This calculation has not been validated in all clinical situations. eGFR's persistently <60 mL/min signify possible Chronic Kidney Disease.    Anion gap 5 5 - 15    Comment: Performed at [facility], [address] [place], [place] [address]  CBC     Status: Abnormal   Collection Time: 11/21/17  4:18 AM  Result Value Ref Range   WBC 12.7 (H) 3.6 - 11.0 K/uL   RBC 4.27 3.80 - 5.20 MIL/uL   Hemoglobin 12.6 12.0 - 16.0 g/dL   HCT 36.7 35.0 - 47.0 %   MCV 86.0 80.0 - 100.0 fL   MCH 29.4 26.0 - 34.0 pg   MCHC 34.2 32.0 - 36.0 g/dL   RDW 14.0 11.5 - 14.5 %   Platelets 200 150 - 440 K/uL    Comment: Performed at [facility], [address] [place], [place] [address]  Magnesium     Status: None   Collection Time: 11/21/17  4:18 AM  Result Value Ref Range   Magnesium 1.7 1.7 - 2.4 mg/dL    Comment: Performed at [facility], [address] [place],  [address]  Glucose, capillary     Status: None   Collection Time: 11/21/17  8:00 AM  Result Value Ref Range   Glucose-Capillary  97 65 - 99 mg/dL   Comment 1 Notify RN   Potassium     Status: Abnormal   Collection Time: 11/21/17  2:55 PM  Result Value Ref Range   Potassium 3.3 (L) 3.5 - 5.1 mmol/L    Comment: Performed at [facility], [address] [place],  [address]  CBC     Status: None   Collection Time: 11/22/17  3:25 AM  Result Value Ref Range   WBC 5.4 3.6 - 11.0 K/uL   RBC 4.33 3.80 - 5.20 MIL/uL   Hemoglobin 12.7 12.0 - 16.0 g/dL   HCT 37.4 35.0 - 47.0 %   MCV 86.5 80.0 - 100.0 fL   MCH 29.4 26.0 - 34.0 pg   MCHC 34.0 32.0 - 36.0 g/dL   RDW 13.7 11.5 - 14.5 %   Platelets 154 150 - 440 K/uL    Comment: Performed at [facility], [address] [place],  [address]  Basic metabolic panel     Status: Abnormal   Collection Time: 11/22/17  3:25 AM  Result Value Ref Range   Sodium 138 135 - 145 mmol/L   Potassium 3.6 3.5 - 5.1 mmol/L   Chloride 109 101 - 111 mmol/L  CO2 22 22 - 32 mmol/L   Glucose, Bld 115 (H) 65 - 99 mg/dL   BUN 5 (L) 6 - 20 mg/dL   Creatinine, Ser 0.64 0.44 - 1.00 mg/dL   Calcium 8.1 (L) 8.9 - 10.3 mg/dL   GFR calc non Af Amer >60 >60 mL/min   GFR calc Af Amer >60 >60 mL/min    Comment: (NOTE) The eGFR has been calculated using the CKD EPI equation. This calculation has not been validated in all clinical situations. eGFR's persistently <60 mL/min signify possible Chronic Kidney Disease.    Anion gap 7 5 - 15    Comment: Performed at [facility], [address] [place], [place] [address]  Glucose, capillary     Status: Abnormal   Collection Time: 11/22/17  7:47 AM  Result Value Ref Range   Glucose-Capillary 108 (H) 65 - 99 mg/dL   Comment 1 Notify RN    No components found for: ESR, C REACTIVE PROTEIN MICRO: Recent Results (from the past 720 hour(s))  Urine Culture     Status: Abnormal   Collection Time: 11/01/17  7:31 PM  Result Value Ref Range Status   Specimen Description   Final    URINE, RANDOM Performed at [facility], [address], [place], [place] [address]    Special Requests   Final    NONE Performed at [facility], [address], [place], [place] [address]    Culture >=100,000 COLONIES/mL STAPHYLOCOCCUS AUREUS (A)  Final   Report Status 11/04/2017 FINAL  Final   Organism ID, Bacteria STAPHYLOCOCCUS AUREUS (A)  Final      Susceptibility   Staphylococcus aureus - MIC*    CIPROFLOXACIN <=0.5 SENSITIVE Sensitive     GENTAMICIN <=0.5 SENSITIVE Sensitive     NITROFURANTOIN <=16 SENSITIVE Sensitive     OXACILLIN <=0.25 SENSITIVE Sensitive     TETRACYCLINE <=1 SENSITIVE Sensitive     VANCOMYCIN 1 SENSITIVE Sensitive     TRIMETH/SULFA <=10 SENSITIVE Sensitive     CLINDAMYCIN <=0.25 SENSITIVE Sensitive     RIFAMPIN <=0.5 SENSITIVE Sensitive     Inducible Clindamycin NEGATIVE Sensitive     * >=100,000 COLONIES/mL STAPHYLOCOCCUS AUREUS  Wet prep, genital     Status: Abnormal   Collection Time: 11/01/17  8:48 PM  Result Value Ref Range Status   Yeast Wet Prep HPF POC NONE SEEN NONE SEEN Final   Trich, Wet Prep NONE SEEN NONE SEEN Final   Clue Cells Wet Prep HPF POC PRESENT (A) NONE SEEN Final   WBC, Wet Prep HPF POC MANY (A) NONE SEEN Final   Sperm NONE SEEN  Final    Comment: Performed at [facility], [address] [place], [place] [address]  [place] rt PCR [facility] only)     Status: None   Collection Time: 11/01/17  8:48 PM  Result Value Ref Range Status   Specimen source GC/Chlam ENDOCERVICAL  Final    Comment: Performed at [facility] [place], [address], [place], [place] [address]   Chlamydia Tr NOT DETECTED NOT DETECTED Final   N gonorrhoeae NOT DETECTED NOT DETECTED Final    Comment: (NOTE) 100  This methodology has not been evaluated in pregnant women or in 200  patients with a history of hysterectomy. 300 400  This methodology will not be performed on patients less than 103  years of age. Performed at [facility], [place], [place]  [address]   Culture, blood (Routine x 2)     Status: None (Preliminary result)   Collection Time: 11/20/17  6:28 PM  Result Value Ref Range Status   Specimen Description   Final    BLOOD LEFT HAND Performed at [facility], [address], [place], [place] [address]    Special Requests   Final    BOTTLES DRAWN AEROBIC AND ANAEROBIC Blood Culture results may not be optimal due to an excessive volume of blood received in culture bottles Performed at [facility], [address] [place], [place] [address]    Culture  Setup Time   Final    GRAM NEGATIVE RODS AEROBIC BOTTLE ONLY CRITICAL RESULT CALLED TO, READ BACK BY AND VERIFIED WITH: STEPHANIE SHUDER 11/21/17 @ 16  [place]    Culture GRAM NEGATIVE RODS  Final   Report Status PENDING  Incomplete  Urine culture     Status: Abnormal (Preliminary result)   Collection Time: 11/20/17  8:04 PM  Result Value Ref Range Status   Specimen Description   Final    URINE, RANDOM Performed at [facility], [address] [place], [place] [address]    Special Requests   Final    NONE Performed at [facility], [address], [place], [place] [address]    Culture >=100,000 COLONIES/mL ESCHERICHIA COLI (A)  Final   Report Status PENDING  Incomplete  Culture, blood (Routine x 2)     Status: Abnormal (Preliminary result)   Collection Time: 11/20/17  9:27 PM  Result Value Ref Range Status   Specimen Description   Final    BLOOD LEFT ANTECUBITAL Performed at [facility], [address], [place], [place] [address]    Special Requests   Final    BOTTLES DRAWN AEROBIC AND ANAEROBIC Blood Culture results may not be optimal due to an excessive volume of blood received in culture bottles Performed at [facility], [address] [place], [place] [address]    Culture  Setup Time   Final    GRAM NEGATIVE RODS IN BOTH AEROBIC AND ANAEROBIC  BOTTLES CRITICAL RESULT CALLED TO, READ BACK BY AND VERIFIED WITH: STEPHANIE SHUDER 11/21/17 @ 65  [place]    Culture (A)  Final    ESCHERICHIA COLI SUSCEPTIBILITIES TO FOLLOW Performed at [facility], [place] [address], [place], [place] [address]    Report Status PENDING  Incomplete  Blood Culture ID Panel (Reflexed)     Status: Abnormal   Collection Time: 11/20/17  9:27 PM  Result Value Ref Range Status   Enterococcus species NOT DETECTED NOT DETECTED Final   Listeria monocytogenes NOT DETECTED NOT DETECTED Final   Staphylococcus species NOT DETECTED NOT DETECTED Final   Staphylococcus aureus NOT DETECTED NOT DETECTED Final   Streptococcus species NOT DETECTED NOT DETECTED Final   Streptococcus agalactiae NOT DETECTED NOT DETECTED Final   Streptococcus pneumoniae NOT DETECTED NOT DETECTED Final   Streptococcus pyogenes NOT DETECTED NOT DETECTED Final   Acinetobacter baumannii NOT DETECTED NOT DETECTED Final   Enterobacteriaceae species DETECTED (A) NOT DETECTED Final    Comment: Enterobacteriaceae represent a large family of gram-negative bacteria, not a single organism. CRITICAL RESULT CALLED TO, READ BACK BY AND VERIFIED WITH: STEPHANIE SHUDER 11/21/17 @ 50  [place]    Enterobacter cloacae complex NOT DETECTED NOT DETECTED Final   Escherichia coli DETECTED (A) NOT DETECTED Final    Comment: CRITICAL RESULT CALLED TO, READ BACK BY AND  VERIFIED WITH: STEPHANIE SHUDER 11/21/17 @ 8338  [place]    Klebsiella oxytoca NOT DETECTED NOT DETECTED Final   Klebsiella pneumoniae NOT DETECTED NOT DETECTED Final   Proteus species NOT DETECTED NOT DETECTED Final   Serratia marcescens NOT DETECTED NOT DETECTED Final   Carbapenem resistance NOT DETECTED NOT DETECTED Final   Haemophilus influenzae NOT DETECTED NOT DETECTED Final   Neisseria meningitidis NOT DETECTED NOT DETECTED Final   Pseudomonas aeruginosa NOT DETECTED NOT DETECTED Final   Candida albicans NOT DETECTED NOT DETECTED Final    Candida glabrata NOT DETECTED NOT DETECTED Final   Candida krusei NOT DETECTED NOT DETECTED Final   Candida parapsilosis NOT DETECTED NOT DETECTED Final   Candida tropicalis NOT DETECTED NOT DETECTED Final    Comment: Performed at [facility], [address] [place], [place] [address]    IMAGING: Dg Chest 2 View  Result Date: 11/20/2017 CLINICAL DATA:  Abdominal pain. EXAM: CHEST  2 VIEW COMPARISON:  October 14, 2007 FINDINGS: Scoliotic curvature of the thoracic spine. The heart, hila, mediastinum, lungs, and pleura are otherwise unremarkable. IMPRESSION: No active cardiopulmonary disease. Electronically Signed   By: Dorise Bullion III M.D   On: 11/20/2017 19:11   Ct Abdomen Pelvis W Contrast  Result Date: 11/20/2017 CLINICAL DATA:  Acute abdominal pain. Fever. Recent antibiotics for urinary tract infection. EXAM: CT ABDOMEN AND PELVIS WITH CONTRAST TECHNIQUE: Multidetector CT imaging of the abdomen and pelvis was performed using the standard protocol following bolus administration of intravenous contrast. CONTRAST:  153m ISOVUE-300 IOPAMIDOL (ISOVUE-300) INJECTION 61% COMPARISON:  None. FINDINGS: Lower chest: Minimal dependent left lower lobe atelectasis. Hepatobiliary: 6 mm hypodensity in the right lobe of the liver too small to characterize, likely cyst or hemangioma. Gallbladder physiologically distended, no calcified stone. No biliary dilatation. Pancreas: No ductal dilatation or inflammation. Spleen: Normal in size without focal abnormality. Adrenals/Urinary Tract: No adrenal nodule. Right pyelonephritis with heterogeneous renal enhancement and ureteral thickening and enhancement. Mild right hydronephrosis. Mild perinephric and periureteric edema. A 13 mm low-density structure in the lower right kidney contains dependent calcification and may be a calyceal diverticulum. 15 mm low-density in the mid posterior right kidney and 11 mm low-density in the upper right kidney have adjacent  cortical thinning and are likely additional caliceal diverticula. No perirenal abscess. Left pyelonephritis with heterogeneous enhancement and ureteral thickening and enhancement. Left upper pole scarring with parenchymal calcification. Mild left perinephric and periureteric edema. Possible calyceal diverticulum in the mid left kidney containing a calcification with adjacent cortical thinning. Additional scarring in the anterior lower left kidney with parenchymal calcifications. No perirenal abscess. Minimally distended urinary bladder with wall thickening and mild perivesicular stranding. Stomach/Bowel: Stomach is within normal limits. Appendix appears normal. No evidence of bowel wall thickening, distention, or inflammatory changes. Vascular/Lymphatic: Normal caliber abdominal aorta. Prominent retroperitoneal nodes are likely reactive. Reproductive: Retroverted uterus.  No adnexal mass. Other: Small amount of free fluid in the pelvis may be reactive or physiologic. No free air. Musculoskeletal: Scoliotic curvature of the thoracolumbar spine. There are no acute or suspicious osseous abnormalities. IMPRESSION: 1. Urinary tract infection with cystitis and bilateral pyelonephritis. No renal or perirenal abscess. 2. Bilateral renal calyceal diverticula, some which contain stones. Mild left renal scarring. Electronically Signed   By: MJeb LeveringM.D.   On: 11/20/2017 23:12    Assessment:   Kylie ANNETTis a 27y.o. female with hx recurrent UTIs for 10 years, hx nephrolithiasis admitted with flank pain and fevers and  found to have bil pyelonephritis, e coli bacteremia.  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 WBc down 12-5, still febrile but can expect that for another 2-3 days with bilateral pyelo. She will need treatment of this UTI, then suppressive oral abx therapy and fu with otpt urology to work on the stones which are likely a nidus of continued infection.   Recommendations Cont  meropenem pending sensitivities. Will need 21 day course of abx - hopefully can have oral options. Will then need suppressive oral abx therapy - will base again on sensitivities. Will need fu with me as otpt and with urology. Thank you very much for allowing me to participate in the care of this patient. Please call with questions.   Cheral Marker. Ola Spurr, MD

## 2017-11-22 NOTE — Progress Notes (Signed)
Patient called nursing into room and had complaints of a bad headache and nausea.  Patient found to have a fever of 102.3.  Zofran and Norco given and rechecked temp 1hr s/p medications and fever is now 100.1.    Patient states she feels better.  We will continue to alternate motrin and norco as needed.

## 2017-11-23 LAB — CBC
HEMATOCRIT: 37.4 % (ref 35.0–47.0)
Hemoglobin: 12.8 g/dL (ref 12.0–16.0)
MCH: 29.4 pg (ref 26.0–34.0)
MCHC: 34.3 g/dL (ref 32.0–36.0)
MCV: 85.7 fL (ref 80.0–100.0)
PLATELETS: 166 10*3/uL (ref 150–440)
RBC: 4.36 MIL/uL (ref 3.80–5.20)
RDW: 13.7 % (ref 11.5–14.5)
WBC: 3.5 10*3/uL — AB (ref 3.6–11.0)

## 2017-11-23 LAB — GLUCOSE, CAPILLARY: Glucose-Capillary: 83 mg/dL (ref 65–99)

## 2017-11-23 LAB — BASIC METABOLIC PANEL
ANION GAP: 7 (ref 5–15)
BUN: 7 mg/dL (ref 6–20)
CO2: 26 mmol/L (ref 22–32)
Calcium: 8.3 mg/dL — ABNORMAL LOW (ref 8.9–10.3)
Chloride: 106 mmol/L (ref 101–111)
Creatinine, Ser: 0.75 mg/dL (ref 0.44–1.00)
GFR calc Af Amer: >60 mL/min (ref >60)
GLUCOSE: 124 mg/dL — AB (ref 65–99)
POTASSIUM: 3 mmol/L — AB (ref 3.5–5.1)
Sodium: 139 mmol/L (ref 135–145)

## 2017-11-23 LAB — URINE CULTURE

## 2017-11-23 LAB — HIV ANTIBODY (ROUTINE TESTING W REFLEX): HIV SCREEN 4TH GENERATION: NONREACTIVE

## 2017-11-23 LAB — CULTURE, BLOOD (ROUTINE X 2)

## 2017-11-23 MED ORDER — MAGNESIUM SULFATE 2 GM/50ML IV SOLN
2.0000 g | Freq: Once | INTRAVENOUS | Status: AC
  Administered 2017-11-23: 2 g via INTRAVENOUS
  Filled 2017-11-23: qty 50

## 2017-11-23 MED ORDER — POTASSIUM CHLORIDE 10 MEQ/100ML IV SOLN
10.0000 meq | INTRAVENOUS | Status: DC
  Filled 2017-11-23: qty 100

## 2017-11-23 MED ORDER — POTASSIUM CHLORIDE 20 MEQ PO PACK: 40.0000 meq | PACK | Freq: Once | ORAL | Status: DC

## 2017-11-23 MED ORDER — POTASSIUM CHLORIDE CRYS ER 20 MEQ PO TBCR
40.0000 meq | EXTENDED_RELEASE_TABLET | Freq: Once | ORAL | Status: AC
  Administered 2017-11-23: 40 meq via ORAL
  Filled 2017-11-23: qty 2

## 2017-11-23 MED ORDER — SODIUM CHLORIDE 0.9 % IV SOLN
1.0000 g | INTRAVENOUS | Status: DC
  Administered 2017-11-23: 1 g via INTRAVENOUS
  Filled 2017-11-23 (×3): qty 10

## 2017-11-23 NOTE — Progress Notes (Signed)
[facility] CLINIC INFECTIOUS DISEASE PROGRESS NOTE Date of Admission:  11/20/2017     ID: Kylie Martin is a 27 y.o. female with pyelonephritis   Active Problems:   Sepsis (HCC)   Subjective: No fever for 24 hours, less pain, wbc down to 3.  Urinating a lot. Still on IV fluids, drinking lots now.  ROS  Eleven systems are reviewed and negative except per hpi  Medications:  Antibiotics Given (last 72 hours)    Date/Time Action Medication Dose Rate   11/20/17 2308 New Bag/Given   cefTRIAXone (ROCEPHIN) 2 g in sodium chloride 0.9 % 100 mL IVPB 2 g 200 mL/hr   11/21/17 0819 New Bag/Given   cefTRIAXone (ROCEPHIN) 2 g in sodium chloride 0.9 % 100 mL IVPB 2 g 200 mL/hr   11/21/17 1355 New Bag/Given   meropenem (MERREM) 1 g in sodium chloride 0.9 % 100 mL IVPB 1 g 200 mL/hr   11/21/17 2146 New Bag/Given   meropenem (MERREM) 1 g in sodium chloride 0.9 % 100 mL IVPB 1 g 200 mL/hr   11/22/17 3664 New Bag/Given   meropenem (MERREM) 1 g in sodium chloride 0.9 % 100 mL IVPB 1 g 200 mL/hr   11/22/17 1459 New Bag/Given   meropenem (MERREM) 1 g in sodium chloride 0.9 % 100 mL IVPB 1 g 200 mL/hr   11/22/17 2106 New Bag/Given   meropenem (MERREM) 1 g in sodium chloride 0.9 % 100 mL IVPB 1 g 200 mL/hr   11/23/17 0745 New Bag/Given   meropenem (MERREM) 1 g in sodium chloride 0.9 % 100 mL IVPB 1 g 200 mL/hr     . calcium-vitamin D  1 tablet Oral BID  . docusate sodium  100 mg Oral BID  . heparin  5,000 Units Subcutaneous Q8H  . Influenza vac split quadrivalent PF  0.5 mL Intramuscular Tomorrow-1000    Objective: Vital signs in last 24 hours: Temp:  [97.8 F (36.6 C)-98.7 F (37.1 C)] 98.2 F (36.8 C) (02/19 0743) Pulse Rate:  [70-79] 79 (02/19 0743) Resp:  [18-20] 19 (02/19 0518) BP: (121-126)/(64-74) 121/74 (02/19 0743) SpO2:  [96 %-100 %] 100 % (02/19 0743) Weight:  [84.6 kg (186 lb 9.6 oz)] 84.6 kg (186 lb 9.6 oz) (02/19 0521) Constitutional:  Sleepy  appears well-developed and  well-nourished. No distress.  HENT: [place]/AT, PERRLA, no scleral icterus Mouth/Throat: Oropharynx is clear and moist. No oropharyngeal exudate.  Cardiovascular: Normal rate, regular rhythm and normal heart sounds. Exam reveals no gallop and no friction rub.  No murmur heard.  Pulmonary/Chest: Effort normal and breath sounds normal. No respiratory distress.  has no wheezes.  Neck = supple, no nuchal rigidity Abdominal: Soft. Bowel sounds are normal.  exhibits no distension. There is no tenderness.  Lymphadenopathy: no cervical adenopathy. No axillary adenopathy Neurological: alert and oriented to person, place, and time.  Skin: Skin is warm and dry. No rash noted. No erythema.  Psychiatric: a normal mood and affect.  behavior is normal.     Lab Results Recent Labs    11/22/17 0325 11/23/17 0353  WBC 5.4 3.5*  HGB 12.7 12.8  HCT 37.4 37.4  NA 138 139  K 3.6 3.0*  CL 109 106  CO2 22 26  BUN 5* 7  CREATININE 0.64 0.75    Microbiology: Results for orders placed or performed during the hospital encounter of 11/20/17  Culture, blood (Routine x 2)     Status: None (Preliminary result)   Collection Time: 11/20/17  6:28 PM  Result Value Ref Range Status   Specimen Description   Final    BLOOD LEFT HAND Performed at [facility], [address] [place], [place] [address]    Special Requests   Final    BOTTLES DRAWN AEROBIC AND ANAEROBIC Blood Culture results may not be optimal due to an excessive volume of blood received in culture bottles Performed at [facility], [address] [place], [place] [address]    Culture  Setup Time   Final    GRAM NEGATIVE RODS AEROBIC BOTTLE ONLY CRITICAL RESULT CALLED TO, READ BACK BY AND VERIFIED WITH: STEPHANIE SHUDER 11/21/17 @ 1209  MLK    Culture GRAM NEGATIVE RODS  Final   Report Status PENDING  Incomplete  Urine culture     Status: Abnormal   Collection Time: 11/20/17  8:04 PM  Result Value Ref Range Status    Specimen Description   Final    URINE, RANDOM Performed at [facility], [address], [place], [place] [address]    Special Requests   Final    NONE Performed at [facility], [address] [place], [place] [address]    Culture >=100,000 COLONIES/mL ESCHERICHIA COLI (A)  Final   Report Status 11/23/2017 FINAL  Final   Organism ID, Bacteria ESCHERICHIA COLI (A)  Final      Susceptibility   Escherichia coli - MIC*    AMPICILLIN 8 SENSITIVE Sensitive     CEFAZOLIN <=4 SENSITIVE Sensitive     CEFTRIAXONE <=1 SENSITIVE Sensitive     CIPROFLOXACIN <=0.25 SENSITIVE Sensitive     GENTAMICIN <=1 SENSITIVE Sensitive     IMIPENEM <=0.25 SENSITIVE Sensitive     NITROFURANTOIN <=16 SENSITIVE Sensitive     TRIMETH/SULFA <=20 SENSITIVE Sensitive     AMPICILLIN/SULBACTAM 4 SENSITIVE Sensitive     PIP/TAZO <=4 SENSITIVE Sensitive     Extended ESBL NEGATIVE Sensitive     * >=100,000 COLONIES/mL ESCHERICHIA COLI  Culture, blood (Routine x 2)     Status: Abnormal   Collection Time: 11/20/17  9:27 PM  Result Value Ref Range Status   Specimen Description   Final    BLOOD LEFT ANTECUBITAL Performed at [facility], [address] [place], [place] [address]    Special Requests   Final    BOTTLES DRAWN AEROBIC AND ANAEROBIC Blood Culture results may not be optimal due to an excessive volume of blood received in culture bottles Performed at [facility], [address] [place], [place] [address]    Culture  Setup Time   Final    GRAM NEGATIVE RODS IN BOTH AEROBIC AND ANAEROBIC BOTTLES CRITICAL RESULT CALLED TO, READ BACK BY AND VERIFIED WITH: STEPHANIE SHUDER 11/21/17 @ 1209  MLK    Culture ESCHERICHIA COLI (A)  Final   Report Status 11/23/2017 FINAL  Final   Organism ID, Bacteria ESCHERICHIA COLI  Final      Susceptibility   Escherichia coli - MIC*    AMPICILLIN 8 SENSITIVE Sensitive     CEFAZOLIN <=4 SENSITIVE Sensitive     CEFEPIME <=1 SENSITIVE  Sensitive     CEFTAZIDIME <=1 SENSITIVE Sensitive     CEFTRIAXONE <=1 SENSITIVE Sensitive     CIPROFLOXACIN <=0.25 SENSITIVE Sensitive     GENTAMICIN <=1 SENSITIVE Sensitive     IMIPENEM <=0.25 SENSITIVE Sensitive     TRIMETH/SULFA <=20 SENSITIVE Sensitive     AMPICILLIN/SULBACTAM 4 SENSITIVE Sensitive     PIP/TAZO <=4  SENSITIVE Sensitive     Extended ESBL NEGATIVE Sensitive     * ESCHERICHIA COLI  Blood Culture ID Panel (Reflexed)     Status: Abnormal   Collection Time: 11/20/17  9:27 PM  Result Value Ref Range Status   Enterococcus species NOT DETECTED NOT DETECTED Final   Listeria monocytogenes NOT DETECTED NOT DETECTED Final   Staphylococcus species NOT DETECTED NOT DETECTED Final   Staphylococcus aureus NOT DETECTED NOT DETECTED Final   Streptococcus species NOT DETECTED NOT DETECTED Final   Streptococcus agalactiae NOT DETECTED NOT DETECTED Final   Streptococcus pneumoniae NOT DETECTED NOT DETECTED Final   Streptococcus pyogenes NOT DETECTED NOT DETECTED Final   Acinetobacter baumannii NOT DETECTED NOT DETECTED Final   Enterobacteriaceae species DETECTED (A) NOT DETECTED Final    Comment: Enterobacteriaceae represent a large family of gram-negative bacteria, not a single organism. CRITICAL RESULT CALLED TO, READ BACK BY AND VERIFIED WITH: STEPHANIE SHUDER 11/21/17 @ 1209  MLK    Enterobacter cloacae complex NOT DETECTED NOT DETECTED Final   Escherichia coli DETECTED (A) NOT DETECTED Final    Comment: CRITICAL RESULT CALLED TO, READ BACK BY AND VERIFIED WITH: STEPHANIE SHUDER 11/21/17 @ 1209  MLK    Klebsiella oxytoca NOT DETECTED NOT DETECTED Final   Klebsiella pneumoniae NOT DETECTED NOT DETECTED Final   Proteus species NOT DETECTED NOT DETECTED Final   Serratia marcescens NOT DETECTED NOT DETECTED Final   Carbapenem resistance NOT DETECTED NOT DETECTED Final   Haemophilus influenzae NOT DETECTED NOT DETECTED Final   Neisseria meningitidis NOT DETECTED NOT DETECTED Final    Pseudomonas aeruginosa NOT DETECTED NOT DETECTED Final   Candida albicans NOT DETECTED NOT DETECTED Final   Candida glabrata NOT DETECTED NOT DETECTED Final   Candida krusei NOT DETECTED NOT DETECTED Final   Candida parapsilosis NOT DETECTED NOT DETECTED Final   Candida tropicalis NOT DETECTED NOT DETECTED Final    Comment: Performed at [facility], [address] [place], [place] [address]  Culture, blood (single) w Reflex to ID Panel     Status: None (Preliminary result)   Collection Time: 11/23/17  3:53 AM  Result Value Ref Range Status   Specimen Description BLOOD RIGHT ANTECUBITAL  Final   Special Requests   Final    BOTTLES DRAWN AEROBIC AND ANAEROBIC Blood Culture adequate volume   Culture   Final    NO GROWTH < 12 HOURS Performed at [facility], [address] [place], [place] [address]    Report Status PENDING  Incomplete    Studies/Results: No results found.  Assessment/Plan: Kylie Martin is a 27 y.o. female with hx recurrent UTIs for 10 years, hx nephrolithiasis admitted with flank pain and fevers and found to have bil pyelonephritis, e coli bacteremia.  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 WBc down 12-3. 5, improving fevers. She will need treatment of this UTI, then suppressive oral abx therapy and fu with otpt urology to work on the stones which are likely a nidus of continued infection.   Recommendations Change mero to ceftriaxone for now If afebrile and tolerating PO tomorrow change to bactrim DS which has good renal penetration Will need 21 day course of abx. Will then need suppressive oral abx therapy with Bactrim SS(Single strength) for at least 1-2 years Will need fu with me as otpt and with urology. Discussed with patient and mother   Thank you very much for the consult. Will follow with you.  Mick Sellavid P Fitzgerald  11/23/2017, 2:12 PM

## 2017-11-23 NOTE — Progress Notes (Signed)
[facility] [place] [facility]   PATIENT NAME: Kylie RaiderKenna Martin    MR#:  [id number]  DATE OF BIRTH:  08/01/1991  SUBJECTIVE:  CHIEF COMPLAINT:   Chief Complaint  Patient presents with  . 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  No fever today ,Tolerating diet. Blood cultures are still pending sensitivity results.  REVIEW OF SYSTEMS:  CONSTITUTIONAL: No fever, fatigue or weakness.  EYES: No blurred or double vision.  EARS, NOSE, AND THROAT: No tinnitus or ear pain.  RESPIRATORY: No cough, shortness of breath, wheezing or hemoptysis.  CARDIOVASCULAR: No chest pain, orthopnea, edema.  GASTROINTESTINAL: No nausea, vomiting, diarrhea or abdominal pain.  GENITOURINARY: No dysuria, hematuria.  ENDOCRINE: No polyuria, nocturia,  HEMATOLOGY: No anemia, easy bruising or bleeding SKIN: No rash or lesion. MUSCULOSKELETAL: No joint pain or arthritis.   NEUROLOGIC: No tingling, numbness, weakness.  PSYCHIATRY: No anxiety or depression.   ROS  DRUG ALLERGIES:   Allergies  Allergen Reactions  . Penicillins Rash    "only with penicillin injection"    VITALS:  Blood pressure 121/74, pulse 79, temperature 98.2 F (36.8 C), temperature source Oral, resp. rate 19, height 5\' 6"  (1.676 m), weight 84.6 kg (186 lb 9.6 oz), last menstrual period 11/04/2017, SpO2 100 %.  PHYSICAL EXAMINATION:  GENERAL:  27 y.o.-year-old patient lying in the bed with no acute distress.  EYES: Pupils equal, round, reactive to light and accommodation. No scleral icterus. Extraocular muscles intact.  HEENT: Head atraumatic, normocephalic. Oropharynx and nasopharynx clear.  NECK:  Supple, no jugular venous distention. No thyroid enlargement, no tenderness.  LUNGS: Normal breath sounds bilaterally, no wheezing, rales,rhonchi or crepitation. No use of accessory muscles of respiration.  CARDIOVASCULAR: S1, S2 normal. No murmurs,  rubs, or gallops.  ABDOMEN: Soft, nontender, nondistended. Bowel sounds present. No organomegaly or mass. Bilateral costovertebral angle tenderness.  EXTREMITIES: No pedal edema, cyanosis, or clubbing.  NEUROLOGIC: Cranial nerves II through XII are intact. Muscle strength 5/5 in all extremities. Sensation intact. Gait not checked.  PSYCHIATRIC: The patient is alert and oriented x 3.  SKIN: No obvious rash, lesion, or ulcer.   Physical Exam LABORATORY PANEL:   CBC Recent Labs  Lab 11/23/17 0353  WBC 3.5*  HGB 12.8  HCT 37.4  PLT 166   ------------------------------------------------------------------------------------------------------------------  Chemistries  Recent Labs  Lab 11/20/17 2127 11/21/17 0418  11/23/17 0353  NA 141 138   < > 139  K 2.4* 3.3*   < > 3.0*  CL 118* 108   < > 106  CO2 19* 25   < > 26  GLUCOSE 91 151*   < > 124*  BUN 8 10   < > 7  CREATININE 0.59 0.86   < > 0.75  CALCIUM 6.0* 7.8*   < > 8.3*  MG  --  1.7  --   --   AST 15  --   --   --   ALT 14  --   --   --   ALKPHOS 61  --   --   --   BILITOT 0.8  --   --   --    < > = values in this interval not displayed.   ------------------------------------------------------------------------------------------------------------------  Cardiac Enzymes No results for input(s): TROPONINI in the last 168 hours. ------------------------------------------------------------------------------------------------------------------  RADIOLOGY:  No results found.  ASSESSMENT AND PLAN:   Active Problems:  Sepsis (HCC)  *  Sepsis, secondary to bilateral pyelonephritis.    IV fluid resuscitation and antibiotic,  meropenem,    urine and blood culture    Urology consult due to recurrent UTI.   * Bacteremia- gram negative rods.   Changed from Rocephin to meropenem IV.   She had multiple UTIs with staphylococci in the past.   negative echocardiogram to rule out endocarditis, called ID consult.   Pending  sensitivity on blood culture, growing Escherichia coli, ID suggested 3 weeks of total therapy which can be oral depending on the sensitivity results.    *  Acute bilateral pyelonephritis, in a patient with recurrent UTIs.  Patient was supposed to follow-up with urology as outpatient for further evaluation, but she did not get the chance to do that yet.  No gross anatomic abnormalities noted per abdomen and pelvis CAT scan.  She is recommended again to follow-up with urology for further testing, as outpatient.  ID and urology but suggesting long-term suppressive antibiotic therapy to prevent recurrent UTI, she did not tolerate Macrobid in the past, we may try with bactrim this time.   *  Hypokalemia, will replace potassium per protocol.  * hypomagnesemia   replace IV  * hypocalcemia   Due to hypoalbuminemia.   Replace magnesium, and would suggest good dietary habits.    All the records are reviewed and case discussed with Care Management/Social Workerr. Management plans discussed with the patient, family and they are in agreement.  CODE STATUS: full.  TOTAL TIME TAKING CARE OF THIS PATIENT: 35 minutes.   Discussed with patient and her mother, father and friends in the room.  POSSIBLE D/C IN 1-2 DAYS, DEPENDING ON CLINICAL CONDITION.   Altamese Dilling M.D on 11/23/2017   Between 7am to 6pm - Pager - [phone]  After 6pm go to [email or web address] - password [email or web address]  [facility]  Office  [phone]  CC: Primary care physician; Patient, No Pcp Per  Note: This dictation was prepared with Dragon dictation along with smaller phrase technology. Any transcriptional errors that result from this process are unintentional.

## 2017-11-24 LAB — BASIC METABOLIC PANEL
Anion gap: 8 (ref 5–15)
BUN: 10 mg/dL (ref 6–20)
CALCIUM: 8.3 mg/dL — AB (ref 8.9–10.3)
CO2: 24 mmol/L (ref 22–32)
CREATININE: 0.82 mg/dL (ref 0.44–1.00)
Chloride: 106 mmol/L (ref 101–111)
Glucose, Bld: 93 mg/dL (ref 65–99)
Potassium: 4 mmol/L (ref 3.5–5.1)
Sodium: 138 mmol/L (ref 135–145)

## 2017-11-24 LAB — GLUCOSE, CAPILLARY: Glucose-Capillary: 85 mg/dL (ref 65–99)

## 2017-11-24 MED ORDER — CALCIUM CARBONATE-VITAMIN D 500-200 MG-UNIT PO TABS: 1.0000 | ORAL_TABLET | Freq: Two times a day (BID) | ORAL | 0 refills | Status: DC

## 2017-11-24 MED ORDER — ACETAMINOPHEN 325 MG PO TABS: 650.0000 mg | ORAL_TABLET | Freq: Four times a day (QID) | ORAL | 0 refills | Status: DC | PRN

## 2017-11-24 MED ORDER — ONDANSETRON HCL 4 MG PO TABS: 4.0000 mg | ORAL_TABLET | Freq: Four times a day (QID) | ORAL | 0 refills | Status: DC | PRN

## 2017-11-24 MED ORDER — SULFAMETHOXAZOLE-TRIMETHOPRIM 800-160 MG PO TABS: 1.0000 | ORAL_TABLET | Freq: Two times a day (BID) | ORAL | 0 refills | Status: AC

## 2017-11-24 MED ORDER — FLUCONAZOLE 50 MG PO TABS
150.0000 mg | ORAL_TABLET | Freq: Once | ORAL | Status: AC
  Administered 2017-11-24: 150 mg via ORAL
  Filled 2017-11-24: qty 3

## 2017-11-24 NOTE — Progress Notes (Signed)
[facility]         [place], [place]   11/24/2017  Patient: Kylie Martin Beid[place]   Date of Birth:  08/13/1991  Date of admission:  11/20/2017  Date of Discharge  11/24/2017    To Whom it May Concern:   Kylie Martin [place]  may return to work on 11/29/17.  PHYSICAL ACTIVITY:  Advise to have light work for first week, then can resume full activities and work.  If you have any questions or concerns, please don't hesitate to call.  Sincerely,   Altamese DillingVaibhavkumar Kirk Basquez M.D Pager [phone]- [phone] Office : [phone]   .

## 2017-11-24 NOTE — Discharge Summary (Signed)
[facility] - [place] at [facility]   PATIENT NAME: Kylie Martin    MR#:  [id number]  DATE OF BIRTH:  01/15/1991  DATE OF ADMISSION:  11/20/2017 ADMITTING PHYSICIAN: Cammy Copa, MD  DATE OF DISCHARGE: 11/24/2017   PRIMARY CARE PHYSICIAN: Patient, No Pcp Per    ADMISSION DIAGNOSIS:  Pyelonephritis [N12] Sepsis, due to unspecified organism (HCC) [A41.9]  DISCHARGE DIAGNOSIS:  Active Problems:   Sepsis (HCC)   SECONDARY DIAGNOSIS:   Past Medical History:  Diagnosis Date  . Anemia   . HSV-2 (herpes simplex virus 2) infection   . Trichomonas     HOSPITAL COURSE:   *Sepsis,secondary to bilateral pyelonephritis.  IV fluid resuscitation and antibiotic, meropenem,   urine and blood culture    Urology consult due to recurrent UTI.   * Bacteremia- gram negative rods.   Changed from Rocephin to meropenem IV.   She had multiple UTIs with staphylococci in the past.   negative echocardiogram to rule out endocarditis, called ID consult.   reviewed sensitivity on blood culture, growing Escherichia coli, ID suggested 3 weeks of total therapy which can be bactrim DS BID, then will need LOng term ( 1 year) suppresive therapy.  Follow in ID clinic and Urology clinic.    *Acute bilateral pyelonephritis,in a patient with recurrent UTIs.Patient was supposed to follow-up with urology as outpatient for further evaluation,but she did not get the chance to do that yet.No gross anatomic abnormalities noted per abdomen and pelvis CAT scan.She is recommended again to follow-up with urology for further testing,as outpatient.  ID and urology but suggesting long-term suppressive antibiotic therapy to prevent recurrent UTI, she did not tolerate Macrobid in the past, we may try with bactrim this time.   *Hypokalemia,will replace potassium per protocol.  * hypomagnesemia   replace IV  * hypocalcemia   Due to hypoalbuminemia.   Replace magnesium, and  would suggest good dietary habits.    DISCHARGE CONDITIONS:   Stable.  CONSULTS OBTAINED:  Treatment Team:  Mick Sell, MD Crista Elliot, MD  DRUG ALLERGIES:   Allergies  Allergen Reactions  . Penicillins Rash    "only with penicillin injection"    DISCHARGE MEDICATIONS:   Allergies as of 11/24/2017      Reactions   Penicillins Rash   "only with penicillin injection"      Medication List    STOP taking these medications   fluconazole 150 MG tablet Commonly known as:  DIFLUCAN   metroNIDAZOLE 500 MG tablet Commonly known as:  FLAGYL     TAKE these medications   acetaminophen 325 MG tablet Commonly known as:  TYLENOL Take 2 tablets (650 mg total) by mouth every 6 (six) hours as needed for mild pain or fever (or Fever >/= 101).   calcium-vitamin D 500-200 MG-UNIT tablet Commonly known as:  OSCAL WITH D Take 1 tablet by mouth 2 (two) times daily.   FIORICET/CODEINE 50-300-40-30 MG Caps Generic drug:  Butalbital-APAP-Caff-Cod Take by mouth.   ondansetron 4 MG tablet Commonly known as:  ZOFRAN Take 1 tablet (4 mg total) by mouth every 6 (six) hours as needed for nausea.   sulfamethoxazole-trimethoprim 800-160 MG tablet Commonly known as:  BACTRIM DS,SEPTRA DS Take 1 tablet by mouth 2 (two) times daily for 20 days.        DISCHARGE INSTRUCTIONS:    Follow with ID in 2 weeks.  If you experience worsening of your admission symptoms, develop shortness of breath, life  threatening emergency, suicidal or homicidal thoughts you must seek medical attention immediately by calling 911 or calling your MD immediately  if symptoms less severe.  You Must read complete instructions/literature along with all the possible adverse reactions/side effects for all the Medicines you take and that have been prescribed to you. Take any new Medicines after you have completely understood and accept all the possible adverse reactions/side effects.   Please  note  You were cared for by a hospitalist during your hospital stay. If you have any questions about your discharge medications or the care you received while you were in the hospital after you are discharged, you can call the unit and asked to speak with the hospitalist on call if the hospitalist that took care of you is not available. Once you are discharged, your primary care physician will handle any further medical issues. Please note that NO REFILLS for any discharge medications will be authorized once you are discharged, as it is imperative that you return to your primary care physician (or establish a relationship with a primary care physician if you do not have one) for your aftercare needs so that they can reassess your need for medications and monitor your lab values.    Today   CHIEF COMPLAINT:   Chief Complaint  Patient presents with  . Abdominal Pain    HISTORY OF PRESENT ILLNESS:  Kylie Martin  is a 27 y.o. 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 Patient was brought to emergency room for acute onset of severe bilateral flank pain, associated with fever and chills in the past 24 hours.  She also reports pain with urination and blood in urine going on for the past 2-3 days.  She took over-the-counter Tylenol without much improvement. Upon evaluation in the emergency room, she is noted to be febrile with temperature of 102.9.  Heart rate is 120.  WBC is elevated at 17.7, potassium is low at 2.4.  Lactic acid level is elevated at 2.4.  UA is positive for UTI.  CT abdomen and pelvis shows urinary tract infection with cystitis and bilateral pyelonephritis. No renal or perirenal abscess.  Patient is admitted for further evaluation and treatment.   VITAL SIGNS:  Blood pressure 125/62, pulse (!) 52, temperature 98 F (36.7 C), temperature source Oral, resp. rate 19, height 5\' 6"  (1.676 m), weight 87.4 kg (192 lb 11.2 oz), last menstrual period 11/04/2017, SpO2 99  %.  I/O:    Intake/Output Summary (Last 24 hours) at 11/24/2017 0940 Last data filed at 11/24/2017 0546 Gross per 24 hour  Intake 5101.67 ml  Output -  Net 5101.67 ml    PHYSICAL EXAMINATION:  GENERAL:  27 y.o.-year-old patient lying in the bed with no acute distress.  EYES: Pupils equal, round, reactive to light and accommodation. No scleral icterus. Extraocular muscles intact.  HEENT: Head atraumatic, normocephalic. Oropharynx and nasopharynx clear.  NECK:  Supple, no jugular venous distention. No thyroid enlargement, no tenderness.  LUNGS: Normal breath sounds bilaterally, no wheezing, rales,rhonchi or crepitation. No use of accessory muscles of respiration.  CARDIOVASCULAR: S1, S2 normal. No murmurs, rubs, or gallops.  ABDOMEN: Soft, non-tender, non-distended. Bowel sounds present. No organomegaly or mass.  EXTREMITIES: No pedal edema, cyanosis, or clubbing.  NEUROLOGIC: Cranial nerves II through XII are intact. Muscle strength 5/5 in all extremities. Sensation intact. Gait not checked.  PSYCHIATRIC: The patient is alert and oriented x 3.  SKIN: No obvious rash, lesion, or ulcer.  DATA REVIEW:   CBC Recent Labs  Lab 11/23/17 0353  WBC 3.5*  HGB 12.8  HCT 37.4  PLT 166    Chemistries  Recent Labs  Lab 11/20/17 2127 11/21/17 0418  11/24/17 0740  NA 141 138   < > 138  K 2.4* 3.3*   < > 4.0  CL 118* 108   < > 106  CO2 19* 25   < > 24  GLUCOSE 91 151*   < > 93  BUN 8 10   < > 10  CREATININE 0.59 0.86   < > 0.82  CALCIUM 6.0* 7.8*   < > 8.3*  MG  --  1.7  --   --   AST 15  --   --   --   ALT 14  --   --   --   ALKPHOS 61  --   --   --   BILITOT 0.8  --   --   --    < > = values in this interval not displayed.    Cardiac Enzymes No results for input(s): TROPONINI in the last 168 hours.  Microbiology Results  Results for orders placed or performed during the hospital encounter of 11/20/17  Culture, blood (Routine x 2)     Status: None (Preliminary result)    Collection Time: 11/20/17  6:28 PM  Result Value Ref Range Status   Specimen Description   Final    BLOOD LEFT HAND Performed at [facility], [address] [place], [place] [address]    Special Requests   Final    BOTTLES DRAWN AEROBIC AND ANAEROBIC Blood Culture results may not be optimal due to an excessive volume of blood received in culture bottles Performed at [facility], [address] [place], [place] [address]    Culture  Setup Time   Final    GRAM NEGATIVE RODS AEROBIC BOTTLE ONLY CRITICAL RESULT CALLED TO, READ BACK BY AND VERIFIED WITH: STEPHANIE SHUDER 11/21/17 @ 1209  MLK    Culture GRAM NEGATIVE RODS  Final   Report Status PENDING  Incomplete  Urine culture     Status: Abnormal   Collection Time: 11/20/17  8:04 PM  Result Value Ref Range Status   Specimen Description   Final    URINE, RANDOM Performed at [facility], [address], [place], [place] [address]    Special Requests   Final    NONE Performed at [facility], [address] [place], [place] [address]    Culture >=100,000 COLONIES/mL ESCHERICHIA COLI (A)  Final   Report Status 11/23/2017 FINAL  Final   Organism ID, Bacteria ESCHERICHIA COLI (A)  Final      Susceptibility   Escherichia coli - MIC*    AMPICILLIN 8 SENSITIVE Sensitive     CEFAZOLIN <=4 SENSITIVE Sensitive     CEFTRIAXONE <=1 SENSITIVE Sensitive     CIPROFLOXACIN <=0.25 SENSITIVE Sensitive     GENTAMICIN <=1 SENSITIVE Sensitive     IMIPENEM <=0.25 SENSITIVE Sensitive     NITROFURANTOIN <=16 SENSITIVE Sensitive     TRIMETH/SULFA <=20 SENSITIVE Sensitive     AMPICILLIN/SULBACTAM 4 SENSITIVE Sensitive     PIP/TAZO <=4 SENSITIVE Sensitive     Extended ESBL NEGATIVE Sensitive     * >=100,000 COLONIES/mL ESCHERICHIA COLI  Culture, blood (Routine x 2)     Status: Abnormal   Collection Time: 11/20/17  9:27 PM  Result Value Ref Range Status  Specimen Description   Final    BLOOD LEFT  ANTECUBITAL Performed at [facility], [address] [place], [place] [address]    Special Requests   Final    BOTTLES DRAWN AEROBIC AND ANAEROBIC Blood Culture results may not be optimal due to an excessive volume of blood received in culture bottles Performed at [facility], [address] [place], [place] [address]    Culture  Setup Time   Final    GRAM NEGATIVE RODS IN BOTH AEROBIC AND ANAEROBIC BOTTLES CRITICAL RESULT CALLED TO, READ BACK BY AND VERIFIED WITH: STEPHANIE SHUDER 11/21/17 @ 1209  MLK    Culture ESCHERICHIA COLI (A)  Final   Report Status 11/23/2017 FINAL  Final   Organism ID, Bacteria ESCHERICHIA COLI  Final      Susceptibility   Escherichia coli - MIC*    AMPICILLIN 8 SENSITIVE Sensitive     CEFAZOLIN <=4 SENSITIVE Sensitive     CEFEPIME <=1 SENSITIVE Sensitive     CEFTAZIDIME <=1 SENSITIVE Sensitive     CEFTRIAXONE <=1 SENSITIVE Sensitive     CIPROFLOXACIN <=0.25 SENSITIVE Sensitive     GENTAMICIN <=1 SENSITIVE Sensitive     IMIPENEM <=0.25 SENSITIVE Sensitive     TRIMETH/SULFA <=20 SENSITIVE Sensitive     AMPICILLIN/SULBACTAM 4 SENSITIVE Sensitive     PIP/TAZO <=4 SENSITIVE Sensitive     Extended ESBL NEGATIVE Sensitive     * ESCHERICHIA COLI  Blood Culture ID Panel (Reflexed)     Status: Abnormal   Collection Time: 11/20/17  9:27 PM  Result Value Ref Range Status   Enterococcus species NOT DETECTED NOT DETECTED Final   Listeria monocytogenes NOT DETECTED NOT DETECTED Final   Staphylococcus species NOT DETECTED NOT DETECTED Final   Staphylococcus aureus NOT DETECTED NOT DETECTED Final   Streptococcus species NOT DETECTED NOT DETECTED Final   Streptococcus agalactiae NOT DETECTED NOT DETECTED Final   Streptococcus pneumoniae NOT DETECTED NOT DETECTED Final   Streptococcus pyogenes NOT DETECTED NOT DETECTED Final   Acinetobacter baumannii NOT DETECTED NOT DETECTED Final   Enterobacteriaceae species DETECTED (A) NOT DETECTED Final     Comment: Enterobacteriaceae represent a large family of gram-negative bacteria, not a single organism. CRITICAL RESULT CALLED TO, READ BACK BY AND VERIFIED WITH: STEPHANIE SHUDER 11/21/17 @ 1209  MLK    Enterobacter cloacae complex NOT DETECTED NOT DETECTED Final   Escherichia coli DETECTED (A) NOT DETECTED Final    Comment: CRITICAL RESULT CALLED TO, READ BACK BY AND VERIFIED WITH: STEPHANIE SHUDER 11/21/17 @ 1209  MLK    Klebsiella oxytoca NOT DETECTED NOT DETECTED Final   Klebsiella pneumoniae NOT DETECTED NOT DETECTED Final   Proteus species NOT DETECTED NOT DETECTED Final   Serratia marcescens NOT DETECTED NOT DETECTED Final   Carbapenem resistance NOT DETECTED NOT DETECTED Final   Haemophilus influenzae NOT DETECTED NOT DETECTED Final   Neisseria meningitidis NOT DETECTED NOT DETECTED Final   Pseudomonas aeruginosa NOT DETECTED NOT DETECTED Final   Candida albicans NOT DETECTED NOT DETECTED Final   Candida glabrata NOT DETECTED NOT DETECTED Final   Candida krusei NOT DETECTED NOT DETECTED Final   Candida parapsilosis NOT DETECTED NOT DETECTED Final   Candida tropicalis NOT DETECTED NOT DETECTED Final    Comment: Performed at [facility], [address] [place], [place] [address]  Culture, blood (single) w Reflex to ID Panel     Status: None (Preliminary result)   Collection Time: 11/23/17  3:53 AM  Result Value Ref Range Status   Specimen Description BLOOD RIGHT ANTECUBITAL  Final   Special Requests   Final    BOTTLES DRAWN AEROBIC AND ANAEROBIC Blood Culture adequate volume   Culture   Final    NO GROWTH 1 DAY Performed at [facility], [address], [place], [place] [address]    Report Status PENDING  Incomplete    RADIOLOGY:  No results found.  EKG:   Orders placed or performed during the hospital encounter of 11/20/17  . EKG 12-Lead  . EKG 12-Lead      Management plans discussed with the patient, family and they are in  agreement.  CODE STATUS:     Code Status Orders  (From admission, onward)        Start     Ordered   11/21/17 0248  Full code  Continuous     11/21/17 0247    Code Status History    Date Active Date Inactive Code Status Order ID Comments User Context   06/22/2012 01:42 06/23/2012 15:28 Full Code [id number]  Drue Second, RN Inpatient   06/20/2012 20:56 06/22/2012 00:55 Full Code [id number]  Rulon Abide Inpatient      TOTAL TIME TAKING CARE OF THIS PATIENT: 35 minutes.    Altamese Dilling M.D on 11/24/2017 at 9:40 AM  Between 7am to 6pm - Pager - [phone]  After 6pm go to [email or web address] - password [email or web address]  [facility] [place] [facility]  Office  [phone]  CC: Primary care physician; Patient, No Pcp Per   Note: This dictation was prepared with Dragon dictation along with smaller phrase technology. Any transcriptional errors that result from this process are unintentional.

## 2017-11-24 NOTE — Discharge Instructions (Signed)
Follow with ID and Urology clinic in 1-2 weeks.

## 2017-11-24 NOTE — Progress Notes (Signed)
Discharge instructions reviewed with patient.  Understanding was verbalized and all questions were answered.  Prescription and work note provided.  Patient discharged via wheelchair in stable condition escorted by nursing staff.

## 2017-11-26 LAB — CULTURE, BLOOD (ROUTINE X 2)

## 2017-11-28 LAB — CULTURE, BLOOD (SINGLE)
Culture: NO GROWTH
Special Requests: ADEQUATE

## 2017-12-06 DIAGNOSIS — N390 Urinary tract infection, site not specified: Secondary | ICD-10-CM | POA: Insufficient documentation

## 2017-12-06 DIAGNOSIS — N10 Acute tubulo-interstitial nephritis: Secondary | ICD-10-CM | POA: Insufficient documentation

## 2017-12-06 DIAGNOSIS — A4151 Sepsis due to Escherichia coli [E. coli]: Secondary | ICD-10-CM | POA: Insufficient documentation

## 2017-12-15 ENCOUNTER — Ambulatory Visit
Admission: EM | Admit: 2017-12-15 | Discharge: 2017-12-15 | Disposition: A | Discharge status: Home / Self Care | Payer: Managed Care, Other (non HMO) | Attending: Family Medicine | Admitting: Family Medicine

## 2017-12-15 ENCOUNTER — Other Ambulatory Visit: Payer: Self-pay

## 2017-12-15 DIAGNOSIS — Z202 Contact with and (suspected) exposure to infections with a predominantly sexual mode of transmission: Secondary | ICD-10-CM

## 2017-12-15 DIAGNOSIS — Z113 Encounter for screening for infections with a predominantly sexual mode of transmission: Secondary | ICD-10-CM

## 2017-12-15 DIAGNOSIS — R319 Hematuria, unspecified: Secondary | ICD-10-CM | POA: Diagnosis not present

## 2017-12-15 DIAGNOSIS — N390 Urinary tract infection, site not specified: Secondary | ICD-10-CM | POA: Diagnosis not present

## 2017-12-15 DIAGNOSIS — N898 Other specified noninflammatory disorders of vagina: Secondary | ICD-10-CM

## 2017-12-15 DIAGNOSIS — R102 Pelvic and perineal pain: Secondary | ICD-10-CM | POA: Diagnosis not present

## 2017-12-15 HISTORY — DX: Sepsis, unspecified organism: A41.9

## 2017-12-15 LAB — URINALYSIS, COMPLETE (UACMP) WITH MICROSCOPIC
BILIRUBIN URINE: NEGATIVE
Glucose, UA: NEGATIVE mg/dL
Ketones, ur: NEGATIVE mg/dL
Nitrite: NEGATIVE
PROTEIN: NEGATIVE mg/dL
Specific Gravity, Urine: 1.015 (ref 1.005–1.030)
pH: 7 (ref 5.0–8.0)

## 2017-12-15 LAB — HCG, QUANTITATIVE, PREGNANCY: hCG, Beta Chain, Quant, S: <1 m[IU]/mL (ref <5)

## 2017-12-15 LAB — CHLAMYDIA/NGC RT PCR (ARMC ONLY)
Chlamydia Tr: NOT DETECTED
N gonorrhoeae: NOT DETECTED

## 2017-12-15 LAB — WET PREP, GENITAL
CLUE CELLS WET PREP: NONE SEEN
SPERM: NONE SEEN
TRICH WET PREP: NONE SEEN
Yeast Wet Prep HPF POC: NONE SEEN

## 2017-12-15 MED ORDER — CEPHALEXIN 500 MG PO CAPS: 500.0000 mg | ORAL_CAPSULE | Freq: Two times a day (BID) | ORAL | 0 refills | Status: AC

## 2017-12-15 MED ORDER — AZITHROMYCIN 500 MG PO TABS
1000.0000 mg | ORAL_TABLET | Freq: Every day | ORAL | Status: DC
  Administered 2017-12-15: 1000 mg via ORAL

## 2017-12-15 NOTE — Discharge Instructions (Signed)
Take medication as prescribed. Rest. Drink plenty of fluids.  No sexual activity at this time as discussed.  Follow-up closely with infectious disease as well as urology.  Follow up with your primary care physician this week as needed. Return to Urgent care for new or worsening concerns.

## 2017-12-15 NOTE — ED Provider Notes (Signed)
[facility] ____________________________________________  Time seen: Approximately 1:05 PM  I have reviewed the triage vital signs and the nursing notes.   HISTORY  Chief Complaint Exposure to STD  HPI Kylie Martin is a 27 y.o. female past medical history of recurrent UTIs, pyelonephritis, with recent history of admission for bilateral pyelonephritis and secondary sepsis in February of this year, presenting for evaluation of 2 days of lower pelvic discomfort and vaginal discharge.  States that she received a call from her boyfriend today that stated that he tested positive for chlamydia.  Patient reports that she had all STD testing while hospitalized in February and it was negative.  States also had STD testing at last urgent care visit in January which was also negative.  Patient requested to have gonorrhea and Chlamydia testing performed currently, declines any other STD testing to be performed at this time.  Denies fevers, cough, congestion, other abdominal pain.  States some mild low back aching, denies flank pain.  Denies hematuria, urinary frequency, urinary urgency or burning with urination.  States vaginal discharge has been somewhat white and intermittent over the last couple of days.  States that she is currently a few days late for her menstrual cycle.  Last sexual intercourse with her boyfriend was 5 days ago and 2 weeks ago prior to that.  Denies recent sexual partner changes.  Denies other complaints.  Reports otherwise feels well. Denies chest pain, shortness of breath, dysuria or rash. Denies recent sickness. Denies recent antibiotic use.  Patient reports she recently finished Bactrim from  discharge from hospital medication and was to began taking Macrobid, states that she was going to begin Macrobid as preventative UTI medication today.    Past Medical History:  Diagnosis Date  . Anemia   . HSV-2 (herpes simplex virus 2) infection   . Sepsis (HCC)   .  Trichomonas     Patient Active Problem List   Diagnosis Date Noted  . Sepsis (HCC) 11/21/2017    Past Surgical History:  Procedure Laterality Date  . WISDOM TOOTH EXTRACTION        Current Facility-Administered Medications:  .  azithromycin (ZITHROMAX) tablet 1,000 mg, 1,000 mg, Oral, Daily, Renford Dills, NP, 1,000 mg at 12/15/17 1407  Current Outpatient Medications:  .  cephALEXin (KEFLEX) 500 MG capsule, Take 1 capsule (500 mg total) by mouth 2 (two) times daily for 7 days., Disp: 14 capsule, Rfl: 0  Allergies Penicillins  Family History  Problem Relation Age of Onset  . Hypertension Father   . Diabetes Father   . COPD Sister        born with RSV has immune problems with lung infections  . Stroke Maternal Uncle   . Hypertension Paternal Aunt   . Hypertension Maternal Grandfather   . Cancer Maternal Grandfather        lung  . Cancer Paternal Grandfather        lung  . Hypertension Paternal Aunt   . Cancer Maternal Grandmother        lung cancer  . Stroke Paternal Grandmother     Social History Social History   Tobacco Use  . Smoking status: Former Smoker    Types: E-cigarettes, Cigarettes  . Smokeless tobacco: Never Used  Substance Use Topics  . Alcohol use: Yes    Comment: socially  . Drug use: No    Review of Systems Constitutional: No fever/chills Cardiovascular: Denies chest pain. Respiratory: Denies shortness of breath. Gastrointestinal: As above. No  nausea, no vomiting.  No diarrhea.  No constipation. Genitourinary: Negative for dysuria. Musculoskeletal: Negative for back pain. Skin: Negative for rash.  ____________________________________________   PHYSICAL EXAM:  VITAL SIGNS: ED Triage Vitals [12/15/17 1245]  Enc Vitals Group     BP (!) 144/88     Pulse Rate 78     Resp 18     Temp 98.1 F (36.7 C)     Temp Source Oral     SpO2 100 %     Weight 190 lb (86.2 kg)     Height 5\' 6"  (1.676 m)     Head Circumference      Peak  Flow      Pain Score 0     Pain Loc      Pain Edu?      Excl. in GC?     Constitutional: Alert and oriented. Well appearing and in no acute distress. ENT      Head: Normocephalic and atraumatic.      Mouth/Throat: Mucous membranes are moist.Oropharynx non-erythematous. Neck: No stridor. Supple without meningismus.  Hematological/Lymphatic/Immunilogical: No cervical lymphadenopathy. Cardiovascular: Normal rate, regular rhythm. Grossly normal heart sounds.  Good peripheral circulation. Respiratory: Normal respiratory effort without tachypnea nor retractions. Breath sounds are clear and equal bilaterally. No wheezes, rales, rhonchi. Gastrointestinal: Mild suprapubic midline tenderness, abdomen otherwise soft and nontender.  No distention. Normal Bowel sounds. No CVA tenderness. Pelvic: Exam completed with Tamela OddiBetsy RN at bedside as chaperone.   External: Normal appearance, no rash or lesion.  Speculum mild amount of whitish vaginal discharge, no bleeding, cervical eyes closed, no foreign body.  Bimanual : Nontender, no cervical tenderness, no adnexal tenderness bilaterally. Musculoskeletal:  Nontender with normal range of motion in all extremities.  Neurologic:  Normal speech and language. No gross focal neurologic deficits are appreciated. Speech is normal. No gait instability.  Skin:  Skin is warm, dry and intact. No rash noted. Psychiatric: Mood and affect are normal. Speech and behavior are normal. Patient exhibits appropriate insight and judgment   ___________________________________________   LABS (all labs ordered are listed, but only abnormal results are displayed)  Labs Reviewed  WET PREP, GENITAL - Abnormal; Notable for the following components:      Result Value   WBC, Wet Prep HPF POC MANY (*)    All other components within normal limits  URINALYSIS, COMPLETE (UACMP) WITH MICROSCOPIC - Abnormal; Notable for the following components:   Color, Urine STRAW (*)    Hgb urine  dipstick TRACE (*)    Leukocytes, UA SMALL (*)    Squamous Epithelial / LPF 0-5 (*)    Bacteria, UA FEW (*)    All other components within normal limits  CHLAMYDIA/NGC RT PCR ([facility] ONLY)  URINE CULTURE  HCG, QUANTITATIVE, PREGNANCY    RADIOLOGY  No results found. ____________________________________________   PROCEDURES Procedures     INITIAL IMPRESSION / ASSESSMENT AND PLAN / ED COURSE  Pertinent labs & imaging results that were available during my care of the patient were reviewed by me and considered in my medical decision making (see chart for details).  Very well-appearing patient.  No acute distress.  States coming in today due to receiving notice from her boyfriend today that he has chlamydia.  Patient reports that she has had some vaginal discharge and intermittent suprapubic discomfort for a few days.  Denies dysuria symptoms.  Pelvic exam completed, no cervical or adnexal tenderness.  Will evaluate urinalysis, wet prep and gonorrhea and chlamydia.  Offered, and patient declined testing of any other STDs.  Serum hCG negative.  Wet prep reviewed, WBCs only.  Will await gonorrhea and chlamydia.  1 g oral azithromycin given in office.  Urinalysis reviewed, concern for onset of UTI.  Will start patient on oral Keflex, culture urine.  Patient to follow-up with urology and infectious disease.  Discussed strict follow-up and return parameters.Discussed indication, risks and benefits of medications with patient.  Discussed follow up with Primary care physician this week. Discussed follow up and return parameters including no resolution or any worsening concerns. Patient verbalized understanding and agreed to plan.   ____________________________________________   FINAL CLINICAL IMPRESSION(S) / ED DIAGNOSES  Final diagnoses:  STD exposure  Vaginal discharge  Urinary tract infection with hematuria, site unspecified     ED Discharge Orders        Ordered     cephALEXin (KEFLEX) 500 MG capsule  2 times daily     12/15/17 1410       Note: This dictation was prepared with Dragon dictation along with smaller phrase technology. Any transcriptional errors that result from this process are unintentional.         Renford Dills, NP 12/15/17 1452

## 2017-12-15 NOTE — ED Triage Notes (Signed)
Pt reports she was just released from the hospital for urosepsis. Now her boyfriend texted her and told her he has chlamydia. Pt with sx of vaginal discharge with odor and itching

## 2017-12-16 NOTE — Progress Notes (Deleted)
12/17/2017 10:40 PM   Kylie Martin 07-27-1991 [id number]  Referring provider: Mick Sell, MD [address] [place], [place] [address]  No chief complaint on file.   HPI: Patient is a 27 -year-old *** female who is referred to [place] by, Dr. Glena Norfolk for recurrent urinary tract infections.  Patient states that she has had *** urinary tract infections over the last year.  Reviewing her records,  she has had *** .    Her symptoms with a urinary tract infection consist of ***.  She denies/endorses dysuria, gross hematuria, suprapubic pain, back pain, abdominal pain or flank pain associated with UTI's.    She has not had any recent fevers, chills, nausea or vomiting associated with UTI's.   She does/does not have a history of nephrolithiasis, GU surgery or GU trauma. ***  She is/is not sexually active.  She has/has not noted a correlation with her urinary tract infections and sexual intercourse.  ***   She does/does not engage in anal sex. ***  She is/ is not having anal to vaginal sex.*** She is/is not voiding before and after sex. ***     She is/is not postmenopausal. ***  She admits to/denies constipation and/or diarrhea. ***  She does/does not use tampons.  She does/does not engage in good perineal hygiene. She does/does not take tub baths. ***  She has/does not have incontinence.  She is using incontinence pads. ***  She is having/ not having pain with bladder filling.  ***  She has/not had any recent imaging studies.  ***  She is drinking *** of water daily.     Reviewed referral notes.    PMH: Past Medical History:  Diagnosis Date  . Anemia   . HSV-2 (herpes simplex virus 2) infection   . Sepsis (HCC)   . Trichomonas     Surgical History: Past Surgical History:  Procedure Laterality Date  . WISDOM TOOTH EXTRACTION      Home Medications:  Allergies as of 12/17/2017      Reactions   Penicillins Rash   "only with penicillin injection"        Medication List        Accurate as of 12/16/17 10:40 PM. Always use your most recent med list.          cephALEXin 500 MG capsule Commonly known as:  KEFLEX Take 1 capsule (500 mg total) by mouth 2 (two) times daily for 7 days.       Allergies:  Allergies  Allergen Reactions  . Penicillins Rash    "only with penicillin injection"    Family History: Family History  Problem Relation Age of Onset  . Hypertension Father   . Diabetes Father   . COPD Sister        born with RSV has immune problems with lung infections  . Stroke Maternal Uncle   . Hypertension Paternal Aunt   . Hypertension Maternal Grandfather   . Cancer Maternal Grandfather        lung  . Cancer Paternal Grandfather        lung  . Hypertension Paternal Aunt   . Cancer Maternal Grandmother        lung cancer  . Stroke Paternal Grandmother     Social History:  reports that she has quit smoking. Her smoking use included e-cigarettes and cigarettes. she has never used smokeless tobacco. She reports that she drinks alcohol. She reports that she does not use drugs.  ROS:                                        Physical Exam: LMP 11/17/2017   Constitutional: Well nourished. Alert and oriented, No acute distress. HEENT: [place] AT, moist mucus membranes. Trachea midline, no masses. Cardiovascular: No clubbing, cyanosis, or edema. Respiratory: Normal respiratory effort, no increased work of breathing. GI: Abdomen is soft, non tender, non distended, no abdominal masses. Liver and spleen not palpable.  No hernias appreciated.  Stool sample for occult testing is not indicated.   GU: No CVA tenderness.  No bladder fullness or masses.  Patient with circumcised/uncircumcised phallus. ***Foreskin easily retracted***  Urethral meatus is patent.  No penile discharge. No penile lesions or rashes. Scrotum without lesions, cysts, rashes and/or edema.  Testicles are located scrotally bilaterally.  No masses are appreciated in the testicles. Left and right epididymis are normal. Rectal: Patient with  normal sphincter tone. Anus and perineum without scarring or rashes. No rectal masses are appreciated. Prostate is approximately *** grams, *** nodules are appreciated. Seminal vesicles are normal. Skin: No rashes, bruises or suspicious lesions. Lymph: No cervical or inguinal adenopathy. Neurologic: Grossly intact, no focal deficits, moving all 4 extremities. Psychiatric: Normal mood and affect.  Laboratory Data: Lab Results  Component Value Date   WBC 3.5 (L) 11/23/2017   HGB 12.8 11/23/2017   HCT 37.4 11/23/2017   MCV 85.7 11/23/2017   PLT 166 11/23/2017    Lab Results  Component Value Date   CREATININE 0.82 11/24/2017    No results found for: PSA  No results found for: TESTOSTERONE  No results found for: HGBA1C  Lab Results  Component Value Date   TSH 3.950 11/13/2015       Component Value Date/Time   CHOL 146 11/13/2015 0000   HDL 52 11/13/2015 0000   CHOLHDL 2.8 11/13/2015 0000   LDLCALC 83 11/13/2015 0000    Lab Results  Component Value Date   AST 15 11/20/2017   Lab Results  Component Value Date   ALT 14 11/20/2017   No components found for: ALKALINEPHOPHATASE No components found for: BILIRUBINTOTAL  No results found for: ESTRADIOL  Urinalysis    Component Value Date/Time   COLORURINE STRAW (A) 12/15/2017 1313   APPEARANCEUR CLEAR 12/15/2017 1313   APPEARANCEUR Hazy 08/06/2014 1847   LABSPEC 1.015 12/15/2017 1313   LABSPEC 1.016 08/06/2014 1847   PHURINE 7.0 12/15/2017 1313   GLUCOSEU NEGATIVE 12/15/2017 1313   GLUCOSEU Negative 08/06/2014 1847   HGBUR TRACE (A) 12/15/2017 1313   BILIRUBINUR NEGATIVE 12/15/2017 1313   BILIRUBINUR Negative 04/01/2017 1500   BILIRUBINUR Negative 08/06/2014 1847   KETONESUR NEGATIVE 12/15/2017 1313   PROTEINUR NEGATIVE 12/15/2017 1313   UROBILINOGEN 0.2 04/01/2017 1500   UROBILINOGEN 1.0 02/20/2013 1512    NITRITE NEGATIVE 12/15/2017 1313   LEUKOCYTESUR SMALL (A) 12/15/2017 1313   LEUKOCYTESUR 1+ 08/06/2014 1847    I have reviewed the labs.   Pertinent Imaging: *** I have independently reviewed the films.    Assessment & Plan:  ***  There are no diagnoses linked to this encounter.  No Follow-up on file.  These notes generated with voice recognition software. I apologize for typographical errors.  Michiel CowboySHANNON Donyae Kohn, PA-C  [facility] [address], [address] [place], [place] [address] [phone]

## 2017-12-17 ENCOUNTER — Ambulatory Visit: Payer: Managed Care, Other (non HMO) | Admitting: Urology

## 2017-12-17 ENCOUNTER — Telehealth (HOSPITAL_COMMUNITY): Payer: Self-pay | Admitting: Internal Medicine

## 2017-12-17 LAB — URINE CULTURE: Culture: >=100000 — AB

## 2017-12-17 MED ORDER — NITROFURANTOIN MONOHYD MACRO 100 MG PO CAPS: 100.0000 mg | ORAL_CAPSULE | Freq: Two times a day (BID) | ORAL | 0 refills | Status: DC

## 2017-12-17 NOTE — Telephone Encounter (Signed)
Clinical staff, please let patient know that urine culture was positive for Enterococcus germ, which is typically not sensitive to cephalexin prescription given at the urgent care visit.  Stop cephalexin.  Prescription for nitrofurantoin was [place] to the pharmacy of [place] [facility] [place] on [address] at [address].  Recheck or followup with your primary care provider for further evaluation if symptoms are not improving.  LM

## 2017-12-20 ENCOUNTER — Telehealth: Payer: Self-pay | Admitting: *Deleted

## 2017-12-20 MED ORDER — NITROFURANTOIN MONOHYD MACRO 100 MG PO CAPS: 100.0000 mg | ORAL_CAPSULE | Freq: Two times a day (BID) | ORAL | 0 refills | Status: DC

## 2018-01-11 ENCOUNTER — Ambulatory Visit: Payer: Self-pay | Admitting: Family Medicine

## 2018-01-11 VITALS — BP 121/75 | HR 102 | Temp 98.7°F | Resp 20

## 2018-01-11 DIAGNOSIS — R6889 Other general symptoms and signs: Secondary | ICD-10-CM

## 2018-01-11 LAB — POCT INFLUENZA A/B
INFLUENZA B, POC: NEGATIVE
Influenza A, POC: POSITIVE — AB

## 2018-01-11 NOTE — Progress Notes (Signed)
Subjective:  fever     Kylie BrodKenna L Martin is a 27 y.o. female who presents for evaluation of fever, body aches, fatigue, nonproductive cough, nasal congestion, and rhinorrhea for 4-5 days.  Patient reports her fever was 103 last night, which is her T-max.  Patient reports that she has been doing normal activities since the onset of symptoms but that she has noticed she gets fatigued and a little short of breath with mild exertion.  Denies any other symptoms. Treatment to date: Tylenol and NyQuil.  Denies rash, nausea, vomiting, diarrhea, wheezing, SOB, chest/back pain, ear pain, sore throat, difficulty swallowing, confusion, facial pressure, headache, initial improvement and then worsening of symptoms, or severe symptoms. History of smoking, asthma, COPD: Negative History of recurrent sinus and/or lung infections: Negative. Medical history: Recurrent UTIs, currently being treated with Macrobid.  Seeing urology.  Review of Systems Pertinent items noted in HPI and remainder of comprehensive ROS otherwise negative.     Objective:   Physical Exam General: Awake, alert, and oriented. No acute distress. Well developed, hydrated and nourished. Appears stated age. Nontoxic appearance.  HEENT:  PND noted.   no erythema to posterior oropharynx.  No edema or exudates of pharynx or tonsils. No erythema or bulging of TM.  Mild erythema/edema to nasal mucosa. Sinuses nontender. Supple neck without adenopathy. Cardiac: Heart rate and rhythm are normal. No murmurs, gallops, or rubs are auscultated. S1 and S2 are heard and are of normal intensity.  Respiratory: No signs of respiratory distress. Lungs clear. No tachypnea. Able to speak in full sentences without dyspnea. Nonlabored respirations.  Skin: Skin is warm, dry and intact. Appropriate color for ethnicity. No cyanosis noted.   Diagnostic Results: Rapid flu test: Positive for influenza A.  Assessment:    influenza   Plan:    Suggested symptomatic OTC  remedies. Nasal saline spray for congestion.   Follow-up in the clinic tomorrow. Discussed red flag symptoms and circumstances with which to seek medical care.  Discussed transmission and ways to prevent it.  Provided note for work.

## 2018-01-12 ENCOUNTER — Ambulatory Visit: Payer: Self-pay

## 2018-02-04 ENCOUNTER — Ambulatory Visit (INDEPENDENT_AMBULATORY_CARE_PROVIDER_SITE_OTHER): Payer: Managed Care, Other (non HMO) | Admitting: Maternal Newborn

## 2018-02-04 ENCOUNTER — Encounter: Payer: Self-pay | Admitting: Maternal Newborn

## 2018-02-04 VITALS — BP 110/80 | HR 66 | Ht 66.0 in | Wt 185.0 lb

## 2018-02-04 DIAGNOSIS — N898 Other specified noninflammatory disorders of vagina: Secondary | ICD-10-CM

## 2018-02-04 DIAGNOSIS — Z3009 Encounter for other general counseling and advice on contraception: Secondary | ICD-10-CM

## 2018-02-04 DIAGNOSIS — B3731 Acute candidiasis of vulva and vagina: Secondary | ICD-10-CM

## 2018-02-04 DIAGNOSIS — R300 Dysuria: Secondary | ICD-10-CM | POA: Diagnosis not present

## 2018-02-04 DIAGNOSIS — B373 Candidiasis of vulva and vagina: Secondary | ICD-10-CM

## 2018-02-04 LAB — POCT URINALYSIS DIPSTICK
Bilirubin, UA: NEGATIVE
Blood, UA: NEGATIVE
Glucose, UA: NEGATIVE
Ketones, UA: NEGATIVE
Nitrite, UA: POSITIVE
SPEC GRAV UA: 1.02 (ref 1.010–1.025)
Urobilinogen, UA: NEGATIVE E.U./dL — AB
pH, UA: 7 (ref 5.0–8.0)

## 2018-02-04 MED ORDER — ETONOGESTREL-ETHINYL ESTRADIOL 0.12-0.015 MG/24HR VA RING
VAGINAL_RING | VAGINAL | 11 refills | Status: DC
Start: 2018-02-04 — End: 2018-11-16

## 2018-02-04 MED ORDER — FLUCONAZOLE 150 MG PO TABS: 150.0000 mg | ORAL_TABLET | Freq: Once | ORAL | 0 refills | Status: AC

## 2018-02-06 LAB — URINE CULTURE

## 2018-02-07 ENCOUNTER — Encounter: Payer: Self-pay | Admitting: Maternal Newborn

## 2018-02-07 ENCOUNTER — Other Ambulatory Visit: Payer: Self-pay | Admitting: Maternal Newborn

## 2018-02-07 DIAGNOSIS — N390 Urinary tract infection, site not specified: Secondary | ICD-10-CM

## 2018-02-07 LAB — POCT WET PREP (WET MOUNT)
Clue Cells Wet Prep Whiff POC: NEGATIVE
Trichomonas Wet Prep HPF POC: ABSENT

## 2018-02-07 MED ORDER — SULFAMETHOXAZOLE-TRIMETHOPRIM 800-160 MG PO TABS: 1.0000 | ORAL_TABLET | Freq: Two times a day (BID) | ORAL | 0 refills | Status: AC

## 2018-02-07 NOTE — Progress Notes (Signed)
Obstetrics & Gynecology Office Visit   Chief Complaint:  Chief Complaint  Patient presents with  . Gynecologic Exam    thinks she has a pH imbalance; start bc    History of Present Illness: Patient presents today for vaginal irritation and discharge.  She has had these symptoms intermittently over the last month and feels that wearing her work uniform pants made of snug polyester fabric is making the problem worse. She has not tried any medication and nothing relieves the symptoms. No new sexual partners. Also has some burning on urination and difficulty holding urine.She is on antibiotic prophylaxis for frequent UTIs. Denies abdominal pain and CVAT. She desires to start a new form of contraception.  Review of Systems: Review of systems negative unless otherwise noted in HPI.  Past Medical History:  Past Medical History:  Diagnosis Date  . Anemia   . HSV-2 (herpes simplex virus 2) infection   . Sepsis (HCC)   . Trichomonas     Past Surgical History:  Past Surgical History:  Procedure Laterality Date  . WISDOM TOOTH EXTRACTION      Gynecologic History: Patient's last menstrual period was 12/17/2017.  Obstetric History: [id number]  Family History:  Family History  Problem Relation Age of Onset  . Hypertension Father   . Diabetes Father   . COPD Sister        born with RSV has immune problems with lung infections  . Stroke Maternal Uncle   . Hypertension Paternal Aunt   . Hypertension Maternal Grandfather   . Cancer Maternal Grandfather        lung  . Cancer Paternal Grandfather        lung  . Hypertension Paternal Aunt   . Cancer Maternal Grandmother        lung cancer  . Stroke Paternal Grandmother     Social History:  Social History   Socioeconomic History  . Marital status: Single    Spouse name: Not on file  . Number of children: 1  . Years of education: 88  . Highest education level: Not on file  Occupational History  . Occupation: Glass blower/designer  Social  Needs  . Financial resource strain: Not on file  . Food insecurity:    Worry: Not on file    Inability: Not on file  . Transportation needs:    Medical: Not on file    Non-medical: Not on file  Tobacco Use  . Smoking status: Former Smoker    Types: E-cigarettes, Cigarettes  . Smokeless tobacco: Never Used  Substance and Sexual Activity  . Alcohol use: Yes    Comment: socially  . Drug use: No  . Sexual activity: Yes    Birth control/protection: None  Lifestyle  . Physical activity:    Days per week: Not on file    Minutes per session: Not on file  . Stress: Not on file  Relationships  . Social connections:    Talks on phone: Not on file    Gets together: Not on file    Attends religious service: Not on file    Active member of club or organization: Not on file    Attends meetings of clubs or organizations: Not on file    Relationship status: Not on file  . Intimate partner violence:    Fear of current or ex partner: Not on file    Emotionally abused: Not on file    Physically abused: Not on file    Forced  sexual activity: Not on file  Other Topics Concern  . Not on file  Social History Narrative  . Not on file    Allergies:  Allergies  Allergen Reactions  . Penicillins Rash    "only with penicillin injection"    Medications: Prior to Admission medications   Medication Sig Start Date End Date Taking? Authorizing Provider  nitrofurantoin, macrocrystal-monohydrate, (MACROBID) 100 MG capsule Take 1 capsule (100 mg total) by mouth 2 (two) times daily. For urine culture positive for UTI, based on culture result. 12/20/17  Yes Isa Rankin, MD  etonogestrel-ethinyl estradiol (NUVARING) 0.12-0.015 MG/24HR vaginal ring Insert vaginally and leave in place for 3 consecutive weeks, then remove for 1 week. 02/04/18   Oswaldo Conroy, CNM    Physical Exam Vitals:  Vitals:   02/04/18 0847  BP: 110/80  Pulse: 66   Patient's last menstrual period was  12/17/2017.  General: NAD HEENT: normocephalic, anicteric Abdomen: soft, non-tender, non-distended.  Genitourinary: External: External female genitalia appear irritated.  Normal urethral meatus, normal Bartholin's and Skene's glands.    Vagina: Normal vaginal mucosa, no evidence of prolapse.    Rectal: deferred  Lymphatic: no evidence of inguinal lymphadenopathy Extremities: no edema, erythema, or tenderness Neurologic: Grossly intact Psychiatric: mood appropriate, affect full  Wet prep: negative clue cells, negative whiff, ph 3.0, occasional yeast, no trichomonads. UA positive for leukocytes and nitrites.  Assessment: 27 y.o. [id number] with symptoms of yeast infection and possible UTI.  Plan: Problem List Items Addressed This Visit    None    Visit Diagnoses    Burning with urination    -  Primary   Relevant Orders   POCT urinalysis dipstick (Completed)   Urine Culture (Completed)   Vaginal discharge       Relevant Orders   NuSwab Vaginitis Plus (VG+)   POCT Wet Prep [facility])   Vaginal irritation       Relevant Orders   NuSwab Vaginitis Plus (VG+)   POCT Wet Prep ([facility])   Vulvovaginal candidiasis       Encounter for counseling regarding initiation of other contraceptive measure       Relevant Medications   etonogestrel-ethinyl estradiol (NUVARING) 0.12-0.015 MG/24HR vaginal ring     1) Treat yeast symptoms with Diflucan. Sent NuSwab to rule out any other co-infection. 2) Will increase dose of Macrobid to therapeutic level and notify to change therapy if urine culture comes back positive for an organism that is not susceptible. 3) After counseling about different forms of birth control, patient desires to try Nuvaring. Rx sent.  Marcelyn Bruins, CNM 02/07/2018  10:35 AM

## 2018-02-07 NOTE — Progress Notes (Signed)
Rx for Bactrim to cover UTI.  Marcelyn Bruins, CNM 02/07/2018  11:08 AM

## 2018-02-07 NOTE — Addendum Note (Signed)
Addended by: Marcelyn Bruins on: 02/07/2018 11:00 AM   Modules accepted: Level of Service

## 2018-02-09 LAB — NUSWAB VAGINITIS PLUS (VG+)
CANDIDA ALBICANS, NAA: POSITIVE — AB
CANDIDA GLABRATA, NAA: NEGATIVE
CHLAMYDIA TRACHOMATIS, NAA: NEGATIVE
NEISSERIA GONORRHOEAE, NAA: NEGATIVE
Trich vag by NAA: NEGATIVE

## 2018-03-22 ENCOUNTER — Telehealth: Payer: Self-pay

## 2018-03-22 NOTE — Telephone Encounter (Signed)
Pt is having a little bit of an issue w/her birth control. She's not sure what is going on with it. Requesting cb to discuss. Cb#[phone]

## 2018-03-23 ENCOUNTER — Other Ambulatory Visit: Payer: Self-pay

## 2018-03-23 ENCOUNTER — Encounter: Payer: Self-pay | Admitting: Emergency Medicine

## 2018-03-23 ENCOUNTER — Ambulatory Visit
Admission: EM | Admit: 2018-03-23 | Discharge: 2018-03-23 | Disposition: A | Discharge status: Home / Self Care | Payer: Managed Care, Other (non HMO) | Attending: Family Medicine | Admitting: Family Medicine

## 2018-03-23 DIAGNOSIS — R319 Hematuria, unspecified: Secondary | ICD-10-CM | POA: Diagnosis not present

## 2018-03-23 DIAGNOSIS — N938 Other specified abnormal uterine and vaginal bleeding: Secondary | ICD-10-CM | POA: Diagnosis not present

## 2018-03-23 DIAGNOSIS — Z975 Presence of (intrauterine) contraceptive device: Secondary | ICD-10-CM

## 2018-03-23 DIAGNOSIS — N390 Urinary tract infection, site not specified: Secondary | ICD-10-CM

## 2018-03-23 DIAGNOSIS — N921 Excessive and frequent menstruation with irregular cycle: Secondary | ICD-10-CM

## 2018-03-23 DIAGNOSIS — N939 Abnormal uterine and vaginal bleeding, unspecified: Secondary | ICD-10-CM

## 2018-03-23 DIAGNOSIS — R109 Unspecified abdominal pain: Secondary | ICD-10-CM

## 2018-03-23 DIAGNOSIS — R300 Dysuria: Secondary | ICD-10-CM | POA: Diagnosis not present

## 2018-03-23 DIAGNOSIS — Z3202 Encounter for pregnancy test, result negative: Secondary | ICD-10-CM | POA: Diagnosis not present

## 2018-03-23 LAB — CBC WITH DIFFERENTIAL/PLATELET
BASOS ABS: 0 10*3/uL (ref 0–0.1)
BASOS PCT: 1 %
EOS ABS: 0.3 10*3/uL (ref 0–0.7)
Eosinophils Relative: 4 %
HCT: 42.1 % (ref 35.0–47.0)
HEMOGLOBIN: 14 g/dL (ref 12.0–16.0)
Lymphocytes Relative: 46 %
Lymphs Abs: 3 10*3/uL (ref 1.0–3.6)
MCH: 28.5 pg (ref 26.0–34.0)
MCHC: 33.3 g/dL (ref 32.0–36.0)
MCV: 85.7 fL (ref 80.0–100.0)
Monocytes Absolute: 0.4 10*3/uL (ref 0.2–0.9)
Monocytes Relative: 7 %
NEUTROS PCT: 42 %
Neutro Abs: 2.7 10*3/uL (ref 1.4–6.5)
Platelets: 263 10*3/uL (ref 150–440)
RBC: 4.91 MIL/uL (ref 3.80–5.20)
RDW: 14 % (ref 11.5–14.5)
WBC: 6.4 10*3/uL (ref 3.6–11.0)

## 2018-03-23 LAB — URINALYSIS, COMPLETE (UACMP) WITH MICROSCOPIC
Bilirubin Urine: NEGATIVE
Glucose, UA: NEGATIVE mg/dL
Ketones, ur: NEGATIVE mg/dL
NITRITE: POSITIVE — AB
PH: 7 (ref 5.0–8.0)
SPECIFIC GRAVITY, URINE: 1.02 (ref 1.005–1.030)
WBC, UA: >50 WBC/hpf (ref 0–5)

## 2018-03-23 LAB — BASIC METABOLIC PANEL
ANION GAP: 11 (ref 5–15)
BUN: 9 mg/dL (ref 6–20)
CALCIUM: 8.6 mg/dL — AB (ref 8.9–10.3)
CHLORIDE: 105 mmol/L (ref 101–111)
CO2: 23 mmol/L (ref 22–32)
CREATININE: 0.86 mg/dL (ref 0.44–1.00)
GFR calc Af Amer: >60 mL/min (ref >60)
GFR calc non Af Amer: >60 mL/min (ref >60)
Glucose, Bld: 98 mg/dL (ref 65–99)
Potassium: 3.8 mmol/L (ref 3.5–5.1)
SODIUM: 139 mmol/L (ref 135–145)

## 2018-03-23 LAB — HCG, QUANTITATIVE, PREGNANCY

## 2018-03-23 LAB — PREGNANCY, URINE: PREG TEST UR: NEGATIVE

## 2018-03-23 MED ORDER — NITROFURANTOIN MONOHYD MACRO 100 MG PO CAPS: 100.0000 mg | ORAL_CAPSULE | Freq: Two times a day (BID) | ORAL | 0 refills | Status: AC

## 2018-03-23 NOTE — Discharge Instructions (Addendum)
Take medication as prescribed. Rest. Drink plenty of fluids.   Follow-up with OB/GYN as discussed tomorrow.  You need to follow-up with your urologist as discussed.  Follow up with your primary care physician this week as needed. Return to Urgent care for increased abdominal pain, fevers, flank pain, vomiting, new or worsening concerns.

## 2018-03-23 NOTE — ED Triage Notes (Addendum)
Patient in today c/o abdominal pain off & on x 1 week worsening yesterday. Patient states she passed "formation" last night (which patient brought with her). Patient didn't know she was pregnant if this is truly a miscarriage. Patient is on the Nuvaring for birth control since 2nd week of May.

## 2018-03-23 NOTE — ED Provider Notes (Signed)
[facility] ____________________________________________  Time seen: Approximately 10:02 AM  I have reviewed the triage vital signs and the nursing notes.   HISTORY  Chief Complaint Abdominal Pain   HPI Kylie Martin is a 27 y.o. female past medical history of recurrent UTIs, previous pyelonephritis and previous STDs, gravida 3, para 1 with miscarriage x2, presenting for evaluation of abdominal cramping and vaginal bleeding.  Patient states the second week of May she started the NuvaRing.  States she did change the NuvaRing at 3 weeks to current, but reports she did not want a menstrual cycle so she did not leave it out for a week and discontinued NuvaRing use.  Patient expressed concerns of possible pregnancy, she states that she does not believe her OB/GYN performed a pregnancy test prior to starting her on NuvaRing.  States has been sexually active at this time partner.  Denies any concerns of STDs.  States last week she began having some intermittent dark blood vaginal spotting.  States yesterday she passed what she describes as a larger clump of bloody tissue, and states since she has been having menstrual-like bleeding and lower abdominal menstrual like cramping. Denies other abdominal pain, and no sharp pain.  States pain is mild.  States she has some normal chronic low back pain without any acute changes.  Denies flank pain.  No accompanying known fevers.  No vomiting or diarrhea, continues with normal bowel habits. States no sexual activity in last "several weeks." Continues to eat and drink overall well.  Does states she is been more tired than normal.  Does report that she is Rh-negative blood typing.  No alleviating measures attempted.  Denies other aggravating or alleviating factors.  She does also reports that she is still having cloudy urine and intermittent burning with urination, states that her UTIs have been chronic. Denies chest pain, shortness of breath, or rash.  Denies recent sickness. Denies recent antibiotic use.   OBGYN: [facility]  LMP: previous menstrual early April  Past Medical History:  Diagnosis Date  . Anemia   . HSV-2 (herpes simplex virus 2) infection   . Sepsis (HCC)   . Trichomonas     Patient Active Problem List   Diagnosis Date Noted  . Sepsis (HCC) 11/21/2017    Past Surgical History:  Procedure Laterality Date  . WISDOM TOOTH EXTRACTION       No current facility-administered medications for this encounter.   Current Outpatient Medications:  .  etonogestrel-ethinyl estradiol (NUVARING) 0.12-0.015 MG/24HR vaginal ring, Insert vaginally and leave in place for 3 consecutive weeks, then remove for 1 week., Disp: 1 each, Rfl: 11 .  nitrofurantoin, macrocrystal-monohydrate, (MACROBID) 100 MG capsule, Take 1 capsule (100 mg total) by mouth 2 (two) times daily for 7 days., Disp: 14 capsule, Rfl: 0  Allergies Penicillins  Family History  Problem Relation Age of Onset  . Hypertension Father   . Diabetes Father   . COPD Sister        born with RSV has immune problems with lung infections  . Stroke Maternal Uncle   . Hypertension Paternal Aunt   . Hypertension Maternal Grandfather   . Cancer Maternal Grandfather        lung  . Cancer Paternal Grandfather        lung  . Hypertension Paternal Aunt   . Cancer Maternal Grandmother        lung cancer  . Stroke Paternal Grandmother     Social History Social History  Tobacco Use  . Smoking status: Former Smoker    Types: E-cigarettes, Cigarettes    Last attempt to quit: 03/23/2010    Years since quitting: 8.0  . Smokeless tobacco: Never Used  Substance Use Topics  . Alcohol use: Yes    Comment: socially  . Drug use: No    Review of Systems Constitutional: No fever/chills Cardiovascular: Denies chest pain. Respiratory: Denies shortness of breath. Gastrointestinal: No abdominal pain.  No nausea, no vomiting.  No diarrhea.  No constipation. Genitourinary: as  above Musculoskeletal: as above Skin: Negative for rash.  ____________________________________________   PHYSICAL EXAM:  VITAL SIGNS: ED Triage Vitals [03/23/18 0910]  Enc Vitals Group     BP (!) 133/93     Pulse Rate 84     Resp 16     Temp 98.5 F (36.9 C)     Temp Source Oral     SpO2 100 %     Weight 180 lb (81.6 kg)     Height 5\' 6"  (1.676 m)     Head Circumference      Peak Flow      Pain Score 4     Pain Loc      Pain Edu?      Excl. in GC?     Constitutional: Alert and oriented. Well appearing and in no acute distress. ENT      Head: Normocephalic and atraumatic. Cardiovascular: Normal rate, regular rhythm. Grossly normal heart sounds.  Good peripheral circulation. Respiratory: Normal respiratory effort without tachypnea nor retractions. Breath sounds are clear and equal bilaterally. No wheezes, rales, rhonchi. Gastrointestinal: Minimal diffuse lower abdominal tenderness.  No point tenderness.  Non-guarding.  No CVA tenderness. Musculoskeletal: No midline cervical, thoracic or lumbar tenderness to palpation.  Neurologic:  Normal speech and language.  Speech is normal. No gait instability.  Skin:  Skin is warm, dry Psychiatric: Mood and affect are normal. Speech and behavior are normal. Patient exhibits appropriate insight and judgment   ___________________________________________   LABS (all labs ordered are listed, but only abnormal results are displayed)  Labs Reviewed  BASIC METABOLIC PANEL - Abnormal; Notable for the following components:      Result Value   Calcium 8.6 (*)    All other components within normal limits  URINALYSIS, COMPLETE (UACMP) WITH MICROSCOPIC - Abnormal; Notable for the following components:   APPearance CLOUDY (*)    Hgb urine dipstick MODERATE (*)    Protein, ur TRACE (*)    Nitrite POSITIVE (*)    Leukocytes, UA MODERATE (*)    Non Squamous Epithelial PRESENT (*)    Bacteria, UA MANY (*)    All other components within  normal limits  URINE CULTURE  PREGNANCY, URINE  HCG, QUANTITATIVE, PREGNANCY  CBC WITH DIFFERENTIAL/PLATELET    PROCEDURES Procedures    INITIAL IMPRESSION / ASSESSMENT AND PLAN / ED COURSE  Pertinent labs & imaging results that were available during my care of the patient were reviewed by me and considered in my medical decision making (see chart for details).  Well-appearing patient.  No acute distress.  Will evaluate CBC, BMP, serum hCG, urine pregnancy negative.  Will also evaluate urinalysis.  Patient declines any concerns of STDs.  Patient declines pelvic exam.  Lab results as above.  Urinalysis many bacteria, positive UTI.  We will culture urine.  Will treat patient with oral Macrobid.  Laboratory results reviewed and discussed with patient.  Serum hCG less than 1 and no recent sexual activity  in several weeks per patient.  Suspect patient with breakthrough NuvaRing vaginal bleeding.  Patient states abdominal discomfort currently is consistent only with menstrual cycle.  Discussed evaluation with ultrasound, patient declines ultrasound.  Encourage close follow-up with her OB/GYN, as well as further close follow-up with her urology due to chronic recurrent UTIs.  Encourage rest, fluids, supportive care.Discussed indication, risks and benefits of medications with patient.    Discussed follow up with Primary care physician this week. Discussed follow up and return parameters including no resolution or any worsening concerns. Patient verbalized understanding and agreed to plan.   ____________________________________________   FINAL CLINICAL IMPRESSION(S) / ED DIAGNOSES  Final diagnoses:  Urinary tract infection with hematuria, site unspecified  Vaginal bleeding  Breakthrough bleeding with NuvaRing     ED Discharge Orders        Ordered    nitrofurantoin, macrocrystal-monohydrate, (MACROBID) 100 MG capsule  2 times daily     03/23/18 1123       Note: This dictation was  prepared with Dragon dictation along with smaller phrase technology. Any transcriptional errors that result from this process are unintentional.         Renford Dills, NP 03/23/18 1137

## 2018-03-25 LAB — URINE CULTURE

## 2018-04-27 ENCOUNTER — Other Ambulatory Visit: Payer: Self-pay | Admitting: Maternal Newborn

## 2018-04-27 ENCOUNTER — Telehealth: Payer: Self-pay

## 2018-04-27 DIAGNOSIS — B373 Candidiasis of vulva and vagina: Secondary | ICD-10-CM

## 2018-04-27 DIAGNOSIS — B3731 Acute candidiasis of vulva and vagina: Secondary | ICD-10-CM

## 2018-04-27 MED ORDER — FLUCONAZOLE 150 MG PO TABS: 150.0000 mg | ORAL_TABLET | Freq: Once | ORAL | 0 refills | Status: AC

## 2018-04-27 NOTE — Telephone Encounter (Signed)
Pt reports she was seen by JS in May for symptoms of ph imbalance. She is having exact same symptoms as before. No pain. Last time Diflucan cleared it up. Pt inquiring if needs to be seen or if she can get a rx/rf of Diflucan. Cb#[phone]

## 2018-04-27 NOTE — Progress Notes (Signed)
Rx for Diflucan sent for yeast infection symptoms.

## 2018-04-27 NOTE — Telephone Encounter (Signed)
Would you please let her know I sent in an Rx for Diflucan? Thanks.

## 2018-04-27 NOTE — Telephone Encounter (Signed)
Pt aware.

## 2018-05-19 NOTE — Progress Notes (Signed)
05/20/2018 11:31 AM   Kylie Martin [birth year] [id number]  Referring provider: Leonel Ramsay, MD [place], [place] [address]  Chief Complaint  Patient presents with  . Recurrent UTI    New Patient    HPI: Patient is a 27 year old Caucasian female who is referred to [place] by Dr. Ola Spurr for recurrent urinary tract infections.  Patient states that she has had back to back infections for years.  She states that she has had them since puberty.    Reviewing her records,  she has had 5 documented UTI's + E.coli resistant to Cipro on 03/23/2018 + Enterococcus faecalis on 12/15/2017 + E.coli with sepsis on 11/20/2017 + Staphylococcus aureus on 11/01/2017 + Staphylococcus aureus on 09/24/2017  Her symptoms with a urinary tract infection consist of cloudy and dark urine, strong odor, side pain and straining to urinate.  She is currently having frequency x 30 minutes to hour, strong urgency, dysuria to do holding urine, nocturia x 3-4, urge and SUI incontinence and a weak urinary stream.  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    She denies gross hematuria, suprapubic pain, back pain, abdominal pain or flank pain.  She has not had any recent fevers, chills, nausea or vomiting.    Contrast CT in 11/2017 noted Right pyelonephritis with heterogeneous renal enhancement and ureteral thickening and enhancement. Mild right hydronephrosis. Mild perinephric and periureteric edema. A 13 mm low-density structure in the lower right kidney contains dependent calcification and may be a calyceal diverticulum. 15 mm low-density in the mid posterior right kidney and 11 mm low-density in the upper right kidney have adjacent cortical thinning and are likely additional caliceal diverticula. No perirenal abscess.  Left pyelonephritis with heterogeneous enhancement and ureteral thickening and enhancement. Left upper pole scarring with parenchymal  calcification. Mild left perinephric and periureteric edema. Possible calyceal diverticulum in the mid left kidney containing a calcification with adjacent cortical thinning.  Additional scarring in the anterior lower left kidney with parenchymal calcifications. No perirenal abscess.  Minimally distended urinary bladder with wall thickening and mild perivesicular stranding.  She is sexually active.  She has not noted a correlation with her urinary tract infections and sexual intercourse.   She denies constipation and/or diarrhea.   She does engage in good perineal hygiene. She does not take tub baths.   She is drinking 1/2 gallon of water daily.  She does not drink sodas.  She drinks coffee/tea once a week.  She drinks an occasional juice.  She drinks alcohol once or twice yearly.    Her CATH UA was positive for greater than 50 WBCs, 6-10 RBCs and many bacteria.  Her PVR was 75 mL.   PMH: Past Medical History:  Diagnosis Date  . Anemia   . HSV-2 (herpes simplex virus 2) infection   . Sepsis ([facility])   . Trichomonas     Surgical History: Past Surgical History:  Procedure Laterality Date  . WISDOM TOOTH EXTRACTION      Home Medications:  Allergies as of 05/20/2018      Reactions   Penicillins Rash   "only with penicillin injection"      Medication List        Accurate as of 05/20/18 11:31 AM. Always use your most recent med list.          etonogestrel-ethinyl estradiol 0.12-0.015 MG/24HR vaginal ring Commonly known as:  NUVARING Insert vaginally  and leave in place for 3 consecutive weeks, then remove for 1 week.   fluconazole 150 MG tablet Commonly known as:  DIFLUCAN Take 1 tablet (150 mg total) by mouth once for 1 dose.   trimethoprim 100 MG tablet Commonly known as:  TRIMPEX Take 1 tablet (100 mg total) by mouth daily.       Allergies:  Allergies  Allergen Reactions  . Penicillins Rash    "only with penicillin injection"    Family History: Family History   Problem Relation Age of Onset  . Hypertension Father   . Diabetes Father   . COPD Sister        born with RSV has immune problems with lung infections  . Stroke Maternal Uncle   . Hypertension Paternal Aunt   . Hypertension Maternal Grandfather   . Cancer Maternal Grandfather        lung  . Cancer Paternal Grandfather        lung  . Hypertension Paternal Aunt   . Cancer Maternal Grandmother        lung cancer  . Stroke Paternal Grandmother     Social History:  reports that she quit smoking about 8 years ago. Her smoking use included e-cigarettes and cigarettes. She has never used smokeless tobacco. She reports that she drinks alcohol. She reports that she does not use drugs.  ROS: UROLOGY Frequent Urination?: Yes Hard to postpone urination?: Yes Burning/pain with urination?: Yes Get up at night to urinate?: Yes Leakage of urine?: Yes Urine stream starts and stops?: No Trouble starting stream?: No Do you have to strain to urinate?: No Blood in urine?: No Urinary tract infection?: Yes Sexually transmitted disease?: No Injury to kidneys or bladder?: No Painful intercourse?: No Weak stream?: Yes Currently pregnant?: No Vaginal bleeding?: No Last menstrual period?: n  Gastrointestinal Nausea?: No Vomiting?: No Indigestion/heartburn?: No Diarrhea?: No Constipation?: No  Constitutional Fever: No Night sweats?: No Weight loss?: No Fatigue?: Yes  Skin Skin rash/lesions?: No Itching?: No  Eyes Blurred vision?: No Double vision?: No  Ears/Nose/Throat Sore throat?: No Sinus problems?: No  Hematologic/Lymphatic Swollen glands?: No Easy bruising?: No  Cardiovascular Leg swelling?: No Chest pain?: No  Respiratory Cough?: No Shortness of breath?: No  Endocrine Excessive thirst?: Yes  Musculoskeletal Back pain?: No Joint pain?: No  Neurological Headaches?: Yes Dizziness?: No  Psychologic Depression?: No Anxiety?: No  Physical Exam: BP (!)  154/84   Pulse 100   Ht '5\' 6"'$  (1.676 m)   Wt 188 lb (85.3 kg)   BMI 30.34 kg/m   Constitutional:  Well nourished. Alert and oriented, No acute distress. HEENT: [place] AT, moist mucus membranes.  Trachea midline, no masses. Cardiovascular: No clubbing, cyanosis, or edema. Respiratory: Normal respiratory effort, no increased work of breathing. GI: Abdomen is soft, non tender, non distended, no abdominal masses. Liver and spleen not palpable.  No hernias appreciated.  Stool sample for occult testing is not indicated.   GU: No CVA tenderness.  No bladder fullness or masses.  Normal external genitalia, normal pubic hair distribution, no lesions.  Normal urethral meatus, no lesions, no prolapse, no discharge.   No urethral masses, tenderness and/or tenderness. No bladder fullness, tenderness or masses. Normal vagina mucosa, good estrogen effect, no discharge, no lesions, good pelvic support, no cystocele or rectocele noted.  No cervical motion tenderness.  Uterus is freely mobile and non-fixed.  No adnexal/parametria masses or tenderness noted.  Anus and perineum are without rashes or lesions.  Skin: No rashes, bruises or suspicious lesions. Lymph: No cervical or inguinal adenopathy. Neurologic: Grossly intact, no focal deficits, moving all 4 extremities. Psychiatric: Normal mood and affect.  Laboratory Data: Lab Results  Component Value Date   WBC 6.4 03/23/2018   HGB 14.0 03/23/2018   HCT 42.1 03/23/2018   MCV 85.7 03/23/2018   PLT 263 03/23/2018    Lab Results  Component Value Date   CREATININE 0.86 03/23/2018    No results found for: PSA  No results found for: TESTOSTERONE  No results found for: HGBA1C  Lab Results  Component Value Date   TSH 3.950 11/13/2015       Component Value Date/Time   CHOL 146 11/13/2015 0000   HDL 52 11/13/2015 0000   CHOLHDL 2.8 11/13/2015 0000   LDLCALC 83 11/13/2015 0000    Lab Results  Component Value Date   AST 15 11/20/2017   Lab  Results  Component Value Date   ALT 14 11/20/2017   No components found for: ALKALINEPHOPHATASE No components found for: BILIRUBINTOTAL  No results found for: ESTRADIOL  Urinalysis CATH UA > 50 WBC's, 6-10 RBC's and many bacteria.    I have reviewed the labs.   Pertinent Imaging: CLINICAL DATA:  Acute abdominal pain. Fever. Recent antibiotics for urinary tract infection.  EXAM: CT ABDOMEN AND PELVIS WITH CONTRAST  TECHNIQUE: Multidetector CT imaging of the abdomen and pelvis was performed using the standard protocol following bolus administration of intravenous contrast.  CONTRAST:  159m ISOVUE-300 IOPAMIDOL (ISOVUE-300) INJECTION 61%  COMPARISON:  None.  FINDINGS: Lower chest: Minimal dependent left lower lobe atelectasis.  Hepatobiliary: 6 mm hypodensity in the right lobe of the liver too small to characterize, likely cyst or hemangioma. Gallbladder physiologically distended, no calcified stone. No biliary dilatation.  Pancreas: No ductal dilatation or inflammation.  Spleen: Normal in size without focal abnormality.  Adrenals/Urinary Tract: No adrenal nodule.  Right pyelonephritis with heterogeneous renal enhancement and ureteral thickening and enhancement. Mild right hydronephrosis. Mild perinephric and periureteric edema. A 13 mm low-density structure in the lower right kidney contains dependent calcification and may be a calyceal diverticulum. 15 mm low-density in the mid posterior right kidney and 11 mm low-density in the upper right kidney have adjacent cortical thinning and are likely additional caliceal diverticula. No perirenal abscess.  Left pyelonephritis with heterogeneous enhancement and ureteral thickening and enhancement. Left upper pole scarring with parenchymal calcification. Mild left perinephric and periureteric edema. Possible calyceal diverticulum in the mid left kidney containing a calcification with adjacent cortical  thinning. Additional scarring in the anterior lower left kidney with parenchymal calcifications. No perirenal abscess.  Minimally distended urinary bladder with wall thickening and mild perivesicular stranding.  Stomach/Bowel: Stomach is within normal limits. Appendix appears normal. No evidence of bowel wall thickening, distention, or inflammatory changes.  Vascular/Lymphatic: Normal caliber abdominal aorta. Prominent retroperitoneal nodes are likely reactive.  Reproductive: Retroverted uterus.  No adnexal mass.  Other: Small amount of free fluid in the pelvis may be reactive or physiologic. No free air.  Musculoskeletal: Scoliotic curvature of the thoracolumbar spine. There are no acute or suspicious osseous abnormalities.  IMPRESSION: 1. Urinary tract infection with cystitis and bilateral pyelonephritis. No renal or perirenal abscess. 2. Bilateral renal calyceal diverticula, some which contain stones. Mild left renal scarring.   Electronically Signed   By: MJeb LeveringM.D.   On: 11/20/2017 23:12  I have independently reviewed the films.    Assessment & Plan:    1. Recurrent  UTI's Criteria for recurrent UTI has been met with 2 or more infections in 6 months or 3 or greater infections in one year Encourage patient to continue her water intake  If using tampons, she should remove them prior to urinating and change them often  Avoid soaking in tubs and wipe front to back after urinating  And initiate suppressive antibiotic with trimethoprim 100 mg daily as suggested by infectious disease back in February Cath UA is suspicious for infection and we will send for culture  2. Bilateral renal calyceal diverticula containing stones Not likely the source of her recurrent UTIs She may suffer from reflux and we will schedule a VCUG once she is cleared from her current infection                                      Return for pending urine culture .  These  notes generated with voice recognition software. I apologize for typographical errors.  Zara Council, PA-C  [facility] [address]  [place], [place] [address] [phone]

## 2018-05-20 ENCOUNTER — Other Ambulatory Visit
Admission: RE | Admit: 2018-05-20 | Discharge: 2018-05-20 | Disposition: A | Discharge status: Home / Self Care | Payer: Managed Care, Other (non HMO) | Source: Ambulatory Visit | Attending: Urology | Admitting: Urology

## 2018-05-20 ENCOUNTER — Encounter: Payer: Self-pay | Admitting: Urology

## 2018-05-20 ENCOUNTER — Ambulatory Visit (INDEPENDENT_AMBULATORY_CARE_PROVIDER_SITE_OTHER): Payer: Managed Care, Other (non HMO) | Admitting: Urology

## 2018-05-20 VITALS — BP 154/84 | HR 100 | Ht 66.0 in | Wt 188.0 lb

## 2018-05-20 DIAGNOSIS — N200 Calculus of kidney: Secondary | ICD-10-CM

## 2018-05-20 DIAGNOSIS — N390 Urinary tract infection, site not specified: Secondary | ICD-10-CM | POA: Diagnosis present

## 2018-05-20 LAB — URINALYSIS, COMPLETE (UACMP) WITH MICROSCOPIC
BILIRUBIN URINE: NEGATIVE
GLUCOSE, UA: NEGATIVE mg/dL
KETONES UR: NEGATIVE mg/dL
Nitrite: NEGATIVE
PROTEIN: 100 mg/dL — AB
SQUAMOUS EPITHELIAL / LPF: NONE SEEN (ref 0–5)
Specific Gravity, Urine: 1.015 (ref 1.005–1.030)
WBC, UA: >50 WBC/hpf (ref 0–5)
pH: 7 (ref 5.0–8.0)

## 2018-05-20 MED ORDER — FLUCONAZOLE 150 MG PO TABS: 150.0000 mg | ORAL_TABLET | Freq: Once | ORAL | 0 refills | Status: AC

## 2018-05-20 MED ORDER — TRIMETHOPRIM 100 MG PO TABS: 100.0000 mg | ORAL_TABLET | Freq: Every day | ORAL | 0 refills | Status: DC

## 2018-05-20 NOTE — Progress Notes (Signed)
In and Out Catheterization  Patient is present today for a I & O catheterization due to recurrent UTI. Patient was cleaned and prepped in a sterile fashion with betadine and Lidocaine 2% jelly was instilled into the urethra.  A 14FR cath was inserted no complications were noted , 75ml of urine return was noted, urine was cloudy yellow in color. A clean urine sample was collected for UA and culture. Bladder was drained  And catheter was removed with out difficulty.    Preformed by: Eligha BridegroomSarah Keo Schirmer, CMA

## 2018-05-22 ENCOUNTER — Telehealth: Payer: Self-pay | Admitting: Urology

## 2018-05-22 LAB — URINE CULTURE

## 2018-05-22 NOTE — Telephone Encounter (Signed)
Please have Kylie Martin hold her trimethoprim and start Septra DS, twice daily for seven days.  She then needs to go back on her trimethoprim daily.   We will also need to do some repeat imaging, but I am in conversations with the physicians as to which imaging would be best.

## 2018-05-24 NOTE — Telephone Encounter (Signed)
Pt called office and I read message from Shannon °

## 2018-05-25 ENCOUNTER — Other Ambulatory Visit: Payer: Self-pay | Admitting: Urology

## 2018-05-25 ENCOUNTER — Telehealth: Payer: Self-pay | Admitting: Urology

## 2018-05-25 DIAGNOSIS — N390 Urinary tract infection, site not specified: Secondary | ICD-10-CM

## 2018-05-25 MED ORDER — SULFAMETHOXAZOLE-TRIMETHOPRIM 800-160 MG PO TABS: 1.0000 | ORAL_TABLET | Freq: Two times a day (BID) | ORAL | 0 refills | Status: DC

## 2018-05-25 NOTE — Telephone Encounter (Signed)
Called the patient and let her know that she does not need an appt in [place] just needs to go to the lab and do her UA and we will call her. Just need to make sure the orders are in.  Marcelino DusterMichelle

## 2018-05-25 NOTE — Telephone Encounter (Signed)
Would you put in the orders for the UA in [place] in three weeks for this patient?

## 2018-05-25 NOTE — Progress Notes (Unsigned)
I have sent a prescription for Septra DS, twice daily for seven days [facility].  She has been informed to take the Septra twice daily for 7 days and not take the trimethoprim while on the Septra.  She is then to restart the trimethoprim once daily.  I will need her to come to the office 3 weeks, the [place] office is probably closer for her to drop off a UA for microscopic examination and culture.  I have spoken with the patient.  She just needs a lab appointment in 3 weeks for UA/urine culture.

## 2018-05-26 NOTE — Telephone Encounter (Signed)
Orders placed.

## 2018-06-09 ENCOUNTER — Ambulatory Visit: Payer: Self-pay | Admitting: Family Medicine

## 2018-06-09 VITALS — BP 134/87 | HR 89 | Temp 98.5°F | Resp 17

## 2018-06-09 DIAGNOSIS — R05 Cough: Secondary | ICD-10-CM

## 2018-06-09 DIAGNOSIS — H6983 Other specified disorders of Eustachian tube, bilateral: Secondary | ICD-10-CM

## 2018-06-09 DIAGNOSIS — R059 Cough, unspecified: Secondary | ICD-10-CM

## 2018-06-09 DIAGNOSIS — R509 Fever, unspecified: Secondary | ICD-10-CM

## 2018-06-09 LAB — POCT INFLUENZA A/B
Influenza A, POC: NEGATIVE
Influenza B, POC: NEGATIVE

## 2018-06-09 MED ORDER — FLUTICASONE PROPIONATE 50 MCG/ACT NA SUSP: 2.0000 | Freq: Every day | NASAL | 0 refills | Status: DC

## 2018-06-09 MED ORDER — BENZONATATE 200 MG PO CAPS: 200.0000 mg | ORAL_CAPSULE | Freq: Every evening | ORAL | 0 refills | Status: DC | PRN

## 2018-06-09 NOTE — Progress Notes (Signed)
Subjective: congestion and cough     Kylie Martin is a 27 y.o. female who presents for evaluation of nonproductive cough, sneezing, sore throat, nasal congestion, left ear fullness, and headache for 2 days.  Patient reports having a fever yesterday (T-max 101) but that this has resolved.  Patient worked her full shift last night.  Patient reports pain in her mid chest with coughing.  Denies pleuritic pain or radiation of pain.  Denies any other symptoms or concerns.  Denies any other cardiac symptoms or cardiac history. Treatment to date: Excedrin, Tylenol, ibuprofen, NyQuil.  Denies rash, nausea, vomiting, diarrhea, SOB, wheezing, back pain, difficulty swallowing, confusion, purulent nasal discharge, anosmia/hyposmia, dental pain, facial pressure/unilateral facial pain, pain exacerbated by bending over, body aches, fatigue, severe symptoms, or initial improvement and then worsening of symptoms. History of smoking, asthma, COPD: Negative. History of recurrent sinus and/or lung infections: Negative. Medical history: Patient is on chronic suppressive antibiotics prescribed by urology for recurrent UTIs and pyelonephritis.   Review of Systems Pertinent items noted in HPI and remainder of comprehensive ROS otherwise negative.     Objective:   Physical Exam General: Awake, alert, and oriented. No acute distress. Well developed, hydrated and nourished. Appears stated age. Nontoxic appearance.  HEENT:  PND noted.  Mild erythema to posterior oropharynx.  No edema or exudates of pharynx or tonsils. No erythema or bulging of TM.  Air/fluid level noted to bilateral tympanic membranes.  Fluid in TM is clear.  Mild erythema/edema to nasal mucosa. Sinuses nontender. Supple neck without adenopathy. Cardiac: Heart rate and rhythm are normal. No murmurs, gallops, or rubs are auscultated. S1 and S2 are heard and are of normal intensity.  Respiratory: No signs of respiratory distress. Lungs clear. No tachypnea. Able  to speak in full sentences without dyspnea. Nonlabored respirations.  Skin: Skin is warm, dry and intact. Appropriate color for ethnicity. No cyanosis noted.   Diagnostic Results: Flu test negative.  Assessment:    viral upper respiratory illness   Plan:    Discussed diagnosis and treatment of URI. Discussed the importance of avoiding unnecessary antibiotic therapy. Suggested symptomatic OTC remedies. Nasal saline spray for congestion. Prescribed Flonase.  Prescribed Tessalon Perles to use as needed at night for cough.  Discussed side/adverse effects of medications prescribed. Discussed ingredients in OTC meds and warned her against duplicating ingredients such as ibuprofen and acetaminophen. Discussed red flag symptoms and circumstances with which to seek medical care.   New Prescriptions   BENZONATATE (TESSALON) 200 MG CAPSULE    Take 1 capsule (200 mg total) by mouth at bedtime as needed for cough.   FLUTICASONE (FLONASE) 50 MCG/ACT NASAL SPRAY    Place 2 sprays into both nostrils daily.

## 2018-06-12 ENCOUNTER — Telehealth: Payer: Self-pay | Admitting: Urology

## 2018-06-20 ENCOUNTER — Telehealth: Payer: Self-pay | Admitting: Urology

## 2018-06-20 NOTE — Telephone Encounter (Signed)
Please call Shelva MajesticKenna and ask her to come in for a UA to check for infection so that we can get the VCUG scheduled.

## 2018-07-06 ENCOUNTER — Ambulatory Visit (INDEPENDENT_AMBULATORY_CARE_PROVIDER_SITE_OTHER): Payer: Managed Care, Other (non HMO) | Admitting: Obstetrics and Gynecology

## 2018-07-06 ENCOUNTER — Encounter: Payer: Self-pay | Admitting: Obstetrics and Gynecology

## 2018-07-06 VITALS — BP 120/80 | HR 70 | Ht 66.0 in | Wt 185.0 lb

## 2018-07-06 DIAGNOSIS — B373 Candidiasis of vulva and vagina: Secondary | ICD-10-CM

## 2018-07-06 DIAGNOSIS — B3731 Acute candidiasis of vulva and vagina: Secondary | ICD-10-CM

## 2018-07-06 LAB — POCT WET PREP WITH KOH
Clue Cells Wet Prep HPF POC: NEGATIVE
KOH PREP POC: NEGATIVE
Trichomonas, UA: NEGATIVE
Yeast Wet Prep HPF POC: POSITIVE

## 2018-07-06 MED ORDER — TERCONAZOLE 0.4 % VA CREA: 1.0000 | TOPICAL_CREAM | Freq: Every day | VAGINAL | 0 refills | Status: DC

## 2018-07-06 NOTE — Progress Notes (Signed)
Patient, No Pcp Per   Chief Complaint  Patient presents with  . Vaginitis    yellowish discharge, little odor and itchiness x one week and a half     HPI:      [name] is a 27 y.o. [phone] who LMP was No LMP recorded., presents today for increased d/c, irritation, mild odor for the past 1 1/2 wks. Pt has been on multiple abx for recurrent UTIs. Currently on trimethoprim daily until VCUG in 2 wks. Has recurrent yeast vag sx in general, usually treated with diflucan. Last diflucan about 3 wks ago. Pt uses water to wash and dryer sheets. She is taking probiotics. No LBP, belly pain, fevers. No current urin sx.  She is sex active, no new partners. Neg STD testing 5/19   Past Medical History:  Diagnosis Date  . Anemia   . HSV-2 (herpes simplex virus 2) infection   . Sepsis ([facility])   . Trichomonas     Past Surgical History:  Procedure Laterality Date  . WISDOM TOOTH EXTRACTION      Family History  Problem Relation Age of Onset  . Hypertension Father   . Diabetes Father   . COPD Sister        born with RSV has immune problems with lung infections  . Stroke Maternal Uncle   . Hypertension Paternal Aunt   . Hypertension Maternal Grandfather   . Cancer Maternal Grandfather        lung  . Cancer Paternal Grandfather        lung  . Hypertension Paternal Aunt   . Cancer Maternal Grandmother        lung cancer  . Stroke Paternal Grandmother     Social History   Socioeconomic History  . Marital status: Single    Spouse name: Not on file  . Number of children: 1  . Years of education: 64  . Highest education level: Not on file  Occupational History  . Occupation: Glass blower/designer  Social Needs  . Financial resource strain: Not on file  . Food insecurity:    Worry: Not on file    Inability: Not on file  . Transportation needs:    Medical: Not on file    Non-medical: Not on file  Tobacco Use  . Smoking status: Former Smoker    Types: E-cigarettes, Cigarettes   Last attempt to quit: 03/23/2010    Years since quitting: 8.2  . Smokeless tobacco: Never Used  Substance and Sexual Activity  . Alcohol use: Yes    Comment: socially  . Drug use: No  . Sexual activity: Yes    Birth control/protection: Inserts    Comment: Nuvaring  Lifestyle  . Physical activity:    Days per week: Not on file    Minutes per session: Not on file  . Stress: Not on file  Relationships  . Social connections:    Talks on phone: Not on file    Gets together: Not on file    Attends religious service: Not on file    Active member of club or organization: Not on file    Attends meetings of clubs or organizations: Not on file    Relationship status: Not on file  . Intimate partner violence:    Fear of current or ex partner: Not on file    Emotionally abused: Not on file    Physically abused: Not on file    Forced sexual activity: Not on file  Other Topics Concern  . Not on file  Social History Narrative  . Not on file    Outpatient Medications Prior to Visit  Medication Sig Dispense Refill  . etonogestrel-ethinyl estradiol (NUVARING) 0.12-0.015 MG/24HR vaginal ring Insert vaginally and leave in place for 3 consecutive weeks, then remove for 1 week. 1 each 11  . trimethoprim (TRIMPEX) 100 MG tablet Take 100 mg by mouth daily.     . benzonatate (TESSALON) 200 MG capsule Take 1 capsule (200 mg total) by mouth at bedtime as needed for cough. (Patient not taking: Reported on 07/06/2018) 20 capsule 0  . fluconazole (DIFLUCAN) 150 MG tablet TAKE ONE TABLET BY MOUTH AS A ONE TIME DOSE  0  . fluticasone (FLONASE) 50 MCG/ACT nasal spray Place 2 sprays into both nostrils daily. (Patient not taking: Reported on 07/06/2018) 16 g 0  . sulfamethoxazole-trimethoprim (BACTRIM DS,SEPTRA DS) 800-160 MG tablet Take 1 tablet by mouth every 12 (twelve) hours. (Patient not taking: Reported on 06/09/2018) 14 tablet 0   No facility-administered medications prior to visit.        ROS:  Review of Systems  Constitutional: Negative for fever.  Gastrointestinal: Negative for blood in stool, constipation, diarrhea, nausea and vomiting.  Genitourinary: Positive for vaginal discharge. Negative for dyspareunia, dysuria, flank pain, frequency, hematuria, urgency, vaginal bleeding and vaginal pain.  Musculoskeletal: Negative for back pain.  Skin: Negative for rash.   BREAST: No symptoms   OBJECTIVE:   Vitals:  BP 120/80   Pulse 70   Ht 5\' 6"  (1.676 m)   Wt 185 lb (83.9 kg)   BMI 29.86 kg/m   Physical Exam  Constitutional: She is oriented to person, place, and time. Vital signs are normal. She appears well-developed.  Pulmonary/Chest: Effort normal.  Genitourinary: Uterus normal. There is no rash, tenderness or lesion on the right labia. There is no rash, tenderness or lesion on the left labia. Uterus is not enlarged and not tender. Cervix exhibits no motion tenderness. Right adnexum displays no mass and no tenderness. Left adnexum displays no mass and no tenderness. No erythema or tenderness in the vagina. Vaginal discharge found.  Genitourinary Comments: ERYTHEMA VAGINALLY; no lesions  Musculoskeletal: Normal range of motion.  Neurological: She is alert and oriented to person, place, and time.  Psychiatric: She has a normal mood and affect. Her behavior is normal. Thought content normal.  Vitals reviewed.   Results: Results for orders placed or performed in visit on 07/06/18 (from the past 24 hour(s))  POCT Wet Prep with KOH     Status: Abnormal   Collection Time: 07/06/18  9:59 AM  Result Value Ref Range   Trichomonas, UA Negative    Clue Cells Wet Prep HPF POC neg    Epithelial Wet Prep HPF POC     Yeast Wet Prep HPF POC POS    Bacteria Wet Prep HPF POC     RBC Wet Prep HPF POC     WBC Wet Prep HPF POC     KOH Prep POC Negative Negative     Assessment/Plan: Candidal vaginitis - Recurrent, on daily abx. Rx terazol. No dryer sheets/cont  probiotics. F/u prn. - Plan: terconazole (TERAZOL 7) 0.4 % vaginal cream, POCT Wet Prep with KOH    Meds ordered this encounter  Medications  . terconazole (TERAZOL 7) 0.4 % vaginal cream    Sig: Place 1 applicator vaginally at bedtime.    Dispense:  45 g    Refill:  0  Order Specific Question:   Supervising Provider    Answer:   Nadara Mustard [[id number]]      Return if symptoms worsen or fail to improve.  Oren Barella B. [place], PA-C 07/06/2018 10:00 AM

## 2018-07-06 NOTE — Patient Instructions (Signed)
I value your feedback and entrusting us with your care. If you get a [facility] patient survey, I would appreciate you taking the time to let us know about your experience today. Thank you! 

## 2018-07-19 ENCOUNTER — Encounter: Payer: Self-pay | Admitting: Urology

## 2018-07-19 NOTE — Progress Notes (Signed)
Certified letter sent on 07/20/2018.

## 2018-07-27 ENCOUNTER — Ambulatory Visit: Payer: Managed Care, Other (non HMO) | Admitting: Obstetrics and Gynecology

## 2018-08-19 ENCOUNTER — Other Ambulatory Visit: Payer: Managed Care, Other (non HMO)

## 2018-08-19 DIAGNOSIS — N390 Urinary tract infection, site not specified: Secondary | ICD-10-CM

## 2018-08-19 LAB — URINALYSIS, COMPLETE
Bilirubin, UA: NEGATIVE
GLUCOSE, UA: NEGATIVE
Ketones, UA: NEGATIVE
NITRITE UA: POSITIVE — AB
PH UA: 6 (ref 5.0–7.5)
Protein, UA: NEGATIVE
Specific Gravity, UA: 1.015 (ref 1.005–1.030)
UUROB: 1 mg/dL (ref 0.2–1.0)

## 2018-08-19 LAB — MICROSCOPIC EXAMINATION

## 2018-08-21 LAB — CULTURE, URINE COMPREHENSIVE

## 2018-08-22 ENCOUNTER — Telehealth: Payer: Self-pay

## 2018-08-22 DIAGNOSIS — N390 Urinary tract infection, site not specified: Secondary | ICD-10-CM

## 2018-08-22 MED ORDER — CEPHALEXIN 500 MG PO CAPS: 500.0000 mg | ORAL_CAPSULE | Freq: Two times a day (BID) | ORAL | 0 refills | Status: DC

## 2018-08-22 NOTE — Telephone Encounter (Signed)
-----   Message from Harle BattiestShannon A McGowan, PA-C sent at 08/22/2018  8:12 AM EST ----- Please let Shelva MajesticKenna know that her urine culture is positive.  She needs to start Augmentin 875/125, twice daily and seven days.  She should not take the trimethoprim while on the Augmentin.  She then needs to start the trimethoprim when she completes her Augmentin.  We need to check her UA after she finishes her antibiotic, so that we can schedule her [facility].

## 2018-10-07 ENCOUNTER — Ambulatory Visit: Payer: Self-pay | Admitting: Emergency Medicine

## 2018-10-07 VITALS — BP 128/78 | HR 103 | Temp 98.3°F | Resp 14

## 2018-10-07 DIAGNOSIS — J111 Influenza due to unidentified influenza virus with other respiratory manifestations: Secondary | ICD-10-CM

## 2018-10-07 DIAGNOSIS — R69 Illness, unspecified: Secondary | ICD-10-CM

## 2018-10-07 DIAGNOSIS — J101 Influenza due to other identified influenza virus with other respiratory manifestations: Secondary | ICD-10-CM

## 2018-10-07 DIAGNOSIS — R112 Nausea with vomiting, unspecified: Secondary | ICD-10-CM

## 2018-10-07 LAB — POCT INFLUENZA A/B
Influenza A, POC: POSITIVE — AB
Influenza B, POC: NEGATIVE

## 2018-10-07 MED ORDER — OSELTAMIVIR PHOSPHATE 75 MG PO CAPS: ORAL_CAPSULE | ORAL | 0 refills | Status: DC

## 2018-10-07 MED ORDER — ONDANSETRON 4 MG PO TBDP: 4.0000 mg | ORAL_TABLET | Freq: Three times a day (TID) | ORAL | 0 refills | Status: DC | PRN

## 2018-10-07 NOTE — Patient Instructions (Addendum)
Your flu test was positive for influenza A. This is causing your fever, nausea, vomiting, muscle aches. Treatment: 2 prescriptions sent to your pharmacy [facility]: -Tamiflu twice a day for 5 days. -Zofran for nausea or vomiting Other treatment is rest and pushing fluids.  Tylenol or ibuprofen if needed for pain or fever. Please read attached instruction sheet on influenza and nausea and vomiting. You may not work until you are feeling better and you have no fever for 24 hours.   Influenza, Adult Influenza, more commonly known as "the flu," is a viral infection that mainly affects the respiratory tract. The respiratory tract includes organs that help you breathe, such as the lungs, nose, and throat. The flu causes many symptoms similar to the common cold along with high fever and body aches. The flu spreads easily from person to person (is contagious). Getting a flu shot (influenza vaccination) every year is the best way to prevent the flu. What are the causes? This condition is caused by the influenza virus. You can get the virus by:  Breathing in droplets that are in the air from an infected person's cough or sneeze.  Touching something that has been exposed to the virus (has been contaminated) and then touching your mouth, nose, or eyes. What increases the risk? The following factors may make you more likely to get the flu:  Not washing or sanitizing your hands often.  Having close contact with many people during cold and flu season.  Touching your mouth, eyes, or nose without first washing or sanitizing your hands.  Not getting a yearly (annual) flu shot. You may have a higher risk for the flu, including serious problems such as a lung infection (pneumonia), if you:  Are older than 65.  Are pregnant.  Have a weakened disease-fighting system (immune system). You may have a weakened immune system if you: ? Have HIV or AIDS. ? Are undergoing chemotherapy. ? Are taking  medicines that reduce (suppress) the activity of your immune system.  Have a long-term (chronic) illness, such as heart disease, kidney disease, diabetes, or lung disease.  Have a liver disorder.  Are severely overweight (morbidly obese).  Have anemia. This is a condition that affects your red blood cells.  Have asthma. What are the signs or symptoms? Symptoms of this condition usually begin suddenly and last 4-14 days. They may include:  Fever and chills.  Headaches, body aches, or muscle aches.  Sore throat.  Cough.  Runny or stuffy (congested) nose.  Chest discomfort.  Poor appetite.  Weakness or fatigue.  Dizziness.  Nausea or vomiting. How is this diagnosed? This condition may be diagnosed based on:  Your symptoms and medical history.  A physical exam.  Swabbing your nose or throat and testing the fluid for the influenza virus. How is this treated? If the flu is diagnosed early, you can be treated with medicine that can help reduce how severe the illness is and how long it lasts (antiviral medicine). This may be given by mouth (orally) or through an IV. Taking care of yourself at home can help relieve symptoms. Your health care provider may recommend:  Taking over-the-counter medicines.  Drinking plenty of fluids. In many cases, the flu goes away on its own. If you have severe symptoms or complications, you may be treated in a hospital. Follow these instructions at home: Activity  Rest as needed and get plenty of sleep.  Stay home from work or school as told by your health care  provider. Unless you are visiting your health care provider, avoid leaving home until your fever has been gone for 24 hours without taking medicine. Eating and drinking  Take an oral rehydration solution (ORS). This is a drink that is sold at pharmacies and retail stores.  Drink enough fluid to keep your urine pale yellow.  Drink clear fluids in small amounts as you are able.  Clear fluids include water, ice chips, diluted fruit juice, and low-calorie sports drinks.  Eat bland, easy-to-digest foods in small amounts as you are able. These foods include bananas, applesauce, rice, lean meats, toast, and crackers.  Avoid drinking fluids that contain a lot of sugar or caffeine, such as energy drinks, regular sports drinks, and soda.  Avoid alcohol.  Avoid spicy or fatty foods. General instructions      Take over-the-counter and prescription medicines only as told by your health care provider.  Use a cool mist humidifier to add humidity to the air in your home. This can make it easier to breathe.  Cover your mouth and nose when you cough or sneeze.  Wash your hands with soap and water often, especially after you cough or sneeze. If soap and water are not available, use alcohol-based hand sanitizer.  Keep all follow-up visits as told by your health care provider. This is important. How is this prevented?   Get an annual flu shot. You may get the flu shot in late summer, fall, or winter. Ask your health care provider when you should get your flu shot.  Avoid contact with people who are sick during cold and flu season. This is generally fall and winter. Contact a health care provider if:  You develop new symptoms.  You have: ? Chest pain. ? Diarrhea. ? A fever.  Your cough gets worse.  You produce more mucus.  You feel nauseous or you vomit. Get help right away if:  You develop shortness of breath or difficulty breathing.  Your skin or nails turn a bluish color.  You have severe pain or stiffness in your neck.  You develop a sudden headache or sudden pain in your face or ear.  You cannot eat or drink without vomiting. Summary  Influenza, more commonly known as "the flu," is a viral infection that primarily affects your respiratory tract.  Symptoms of the flu usually begin suddenly and last 4-14 days.  Getting an annual flu shot is the  best way to prevent getting the flu.  Stay home from work or school as told by your health care provider. Unless you are visiting your health care provider, avoid leaving home until your fever has been gone for 24 hours without taking medicine.  Keep all follow-up visits as told by your health care provider. This is important. This information is not intended to replace advice given to you by your health care provider. Make sure you discuss any questions you have with your health care provider. Document Released: 09/18/2000 Document Revised: 03/09/2018 Document Reviewed: 03/09/2018 Elsevier Interactive Patient Education  2019 [employer]  Nausea and Vomiting, Adult Nausea is feeling sick to your stomach or feeling that you are about to throw up (vomit). Vomiting is when food in your stomach is thrown up and out of the mouth. Throwing up can make you feel weak. It can also make you lose too much water in your body (get dehydrated). If you lose too much water in your body, you may:  Feel tired.  Feel thirsty.  Have a dry  mouth.  Have cracked lips.  Go pee (urinate) less often. Older adults and people with other diseases or a weak body defense system (immune system) are at higher risk for losing too much water in the body. If you feel sick to your stomach and you throw up, it is important to follow instructions from your doctor about how to take care of yourself. Follow these instructions at home: Watch your symptoms for any changes. Tell your doctor about them. Follow these instructions to care for yourself at home. Eating and drinking      Take an ORS (oral rehydration solution). This is a drink that is sold at pharmacies and stores.  Drink clear fluids in small amounts as you are able, such as: ? Water. ? Ice chips. ? Fruit juice that has water added (diluted fruit juice). ? Low-calorie sports drinks.  Eat bland, easy-to-digest foods in small amounts as you are able, such  as: ? Bananas. ? Applesauce. ? Rice. ? Low-fat (lean) meats. ? Toast. ? Crackers.  Avoid drinking fluids that have a lot of sugar or caffeine in them. This includes energy drinks, sports drinks, and soda.  Avoid alcohol.  Avoid spicy or fatty foods. General instructions  Take over-the-counter and prescription medicines only as told by your doctor.  Drink enough fluid to keep your pee (urine) pale yellow.  Wash your hands often with soap and water. If you cannot use soap and water, use hand sanitizer.  Make sure that all people in your home wash their hands well and often.  Rest at home while you get better.  Watch your condition for any changes.  Take slow and deep breaths when you feel sick to your stomach.  Keep all follow-up visits as told by your doctor. This is important. Contact a doctor if:  Your symptoms get worse.  You have new symptoms.  You have a fever.  You cannot drink fluids without throwing up.  You feel sick to your stomach for more than 2 days.  You feel light-headed or dizzy.  You have a headache.  You have muscle cramps.  You have a rash.  You have pain while peeing. Get help right away if:  You have pain in your chest, neck, arm, or jaw.  You feel very weak or you pass out (faint).  You throw up again and again.  You have throw up that is bright red or looks like black coffee grounds.  You have bloody or black poop (stools) or poop that looks like tar.  You have a very bad headache, a stiff neck, or both.  You have very bad pain, cramping, or bloating in your belly (abdomen).  You have trouble breathing.  You are breathing very quickly.  Your heart is beating very quickly.  Your skin feels cold and clammy.  You feel confused.  You have signs of losing too much water in your body, such as: ? Dark pee, very little pee, or no pee. ? Cracked lips. ? Dry mouth. ? Sunken eyes. ? Sleepiness. ? Weakness. These symptoms  may be an emergency. Do not wait to see if the symptoms will go away. Get medical help right away. Call your local emergency services (911 in the [place] Do not drive yourself to the hospital. Summary  Nausea is feeling sick to your stomach or feeling that you are about to throw up (vomit). Vomiting is when food in your stomach is thrown up and out of the mouth.  Follow  instructions from your doctor about eating and drinking to keep from losing too much water in your body.  Take over-the-counter and prescription medicines only as told by your doctor.  Contact your doctor if your symptoms get worse or you have new symptoms.  Keep all follow-up visits as told by your doctor. This is important. This information is not intended to replace advice given to you by your health care provider. Make sure you discuss any questions you have with your health care provider. Document Released: 03/09/2008 Document Revised: 03/01/2018 Document Reviewed: 03/01/2018 Elsevier Interactive Patient Education  2019 [employer].

## 2018-10-07 NOTE — Progress Notes (Signed)
[facility]  Patient ID: Kylie BrodKenna L Riva DOB: 28 y.o. MRN: [id number]  Subjective:  HPI : Acute onset yesterday p.m.  Fever to 101.4 with nausea, vomiting x4, loose watery diarrhea x4 chills, sweats, myalgias, fatigue, nonfocal headache.  Has mild periumbilical discomfort, no right or left lower quadrant or right upper quadrant pain. Symptoms are progressively worsening, despite trying OTC fever reducing medicine and rest and fluids.  Took Excedrin couple hours ago and that helped fever and headache somewhat.  Has decreased appetite, but tolerating some clear liquids by mouth.  No history of recent tick bite. She was exposed to someone at work 2 days ago with very similar symptoms of fever vomiting and GI symptoms.  Review of Systems: Positive for fatigue, minimal nasal congestion, mild swollen anterior neck glands. Denies cough or sore throat. Negative for acute vision changes, stiff neck, focal weakness, syncope, seizures, respiratory distress, GU symptoms, new rash.   Specifically denies dysuria or urinary frequency or pyuria or hematuria. She denies chance of pregnancy.  Uses NuvaRing.   Objective: Blood pressure 128/78, pulse (!) 103, temperature 98.3 F (36.8 C), temperature source Oral, resp. rate 14, SpO2 98 %.  Physical Exam Vitals signs and nursing note reviewed.  Constitutional:      General: She is not in acute distress.    Appearance: She is well-developed. She is ill-appearing (very fatigued, but no cardiorespiratory distress) and diaphoretic. She is not toxic-appearing.     Comments: She can ambulate on her own  HENT:     Head: Normocephalic and atraumatic.     Right Ear: Tympanic membrane and external ear normal.     Left Ear: Tympanic membrane and external ear normal.     Nose: No rhinorrhea.     Mouth/Throat:     Mouth: Mucous membranes are moist.     Pharynx: No oropharyngeal exudate or posterior oropharyngeal erythema.   Eyes:     General: No scleral icterus.       Right eye: No discharge.        Left eye: No discharge.     Conjunctiva/sclera: Conjunctivae normal.  Neck:     Musculoskeletal: Neck supple.  Cardiovascular:     Rate and Rhythm: Normal rate and regular rhythm.     Heart sounds: Normal heart sounds. No murmur. No friction rub. No gallop.      Comments: Pulse 98, regular Pulmonary:     Effort: No respiratory distress.     Breath sounds: Normal breath sounds. No stridor. No wheezing or rales.  Abdominal:     General: There is no distension.     Palpations: Abdomen is soft.     Tenderness: There is no right CVA tenderness, left CVA tenderness, guarding or rebound. Negative signs include Murphy's sign and McBurney's sign.     Comments: Bowel sounds hyperactive x4. Minimal periumbilical tenderness without masses guarding or rebound  Lymphadenopathy:     Cervical: Cervical adenopathy (mild shoddy anterior cervical nodes) present.  Skin:    General: Skin is warm.     Findings: No rash.  Neurological:     Mental Status: She is alert.   No neurologic focal deficits.  Cranial nerves intact  Rapid flu test:    POSITIVE FOR INFLUENZA A   Assessment: INFLUENZA A Had nausea vomiting and diarrhea, likely from influenza A.  She is not clinically dehydrated and vital signs are stable and I believe she can rehydrate orally and use Zofran as  needed for nausea.   Plan:  11:06 AM, gave Zofran 4 mg sublingually stat. When rechecked, nausea improved.  Treatment options discussed, as well as risks, benefits, alternatives. Patient voiced understanding and agreement with the following plans:  New Prescriptions   ONDANSETRON (ZOFRAN-ODT) 4 MG DISINTEGRATING TABLET    Take 1 tablet (4 mg total) by mouth every 8 (eight) hours as needed for nausea or vomiting.   OSELTAMIVIR (TAMIFLU) 75 MG CAPSULE    Starting today, take 1 capsule by mouth twice a day for 5 days.      AVS printed and reviewed  verbally.  Your flu test was positive for influenza A. This is causing your fever, nausea, vomiting, muscle aches. Treatment: 2 prescriptions sent to your pharmacy [facility] [place]: -Tamiflu twice a day for 5 days. -Zofran for nausea or vomiting Other treatment is rest and pushing fluids.  Tylenol or ibuprofen if needed for pain or fever. Please read attached instruction sheet on influenza and nausea and vomiting. You may not work until you are feeling better and you have no fever for 24 hours.  Follow-up with your primary care doctor in 5-7 days if not improving, or sooner if symptoms become worse. Precautions discussed. Red flags discussed. Questions invited and answered. Patient voiced understanding and agreement.

## 2018-10-31 ENCOUNTER — Other Ambulatory Visit: Payer: Self-pay

## 2018-10-31 ENCOUNTER — Emergency Department
Admission: EM | Admit: 2018-10-31 | Discharge: 2018-10-31 | Disposition: A | Discharge status: Home / Self Care | Payer: Managed Care, Other (non HMO) | Attending: Emergency Medicine | Admitting: Emergency Medicine

## 2018-10-31 DIAGNOSIS — Z79899 Other long term (current) drug therapy: Secondary | ICD-10-CM | POA: Insufficient documentation

## 2018-10-31 DIAGNOSIS — R55 Syncope and collapse: Secondary | ICD-10-CM | POA: Insufficient documentation

## 2018-10-31 DIAGNOSIS — Z87891 Personal history of nicotine dependence: Secondary | ICD-10-CM | POA: Diagnosis not present

## 2018-10-31 LAB — CBC
HEMATOCRIT: 40.1 % (ref 36.0–46.0)
Hemoglobin: 12.8 g/dL (ref 12.0–15.0)
MCH: 27.6 pg (ref 26.0–34.0)
MCHC: 31.9 g/dL (ref 30.0–36.0)
MCV: 86.4 fL (ref 80.0–100.0)
Platelets: 303 10*3/uL (ref 150–400)
RBC: 4.64 MIL/uL (ref 3.87–5.11)
RDW: 13.1 % (ref 11.5–15.5)
WBC: 8.3 10*3/uL (ref 4.0–10.5)
nRBC: 0 % (ref 0.0–0.2)

## 2018-10-31 LAB — BASIC METABOLIC PANEL
Anion gap: 7 (ref 5–15)
BUN: 11 mg/dL (ref 6–20)
CO2: 25 mmol/L (ref 22–32)
Calcium: 8.8 mg/dL — ABNORMAL LOW (ref 8.9–10.3)
Chloride: 105 mmol/L (ref 98–111)
Creatinine, Ser: 0.85 mg/dL (ref 0.44–1.00)
GFR calc non Af Amer: >60 mL/min (ref >60)
Glucose, Bld: 105 mg/dL — ABNORMAL HIGH (ref 70–99)
Potassium: 3.3 mmol/L — ABNORMAL LOW (ref 3.5–5.1)
Sodium: 137 mmol/L (ref 135–145)

## 2018-10-31 LAB — URINALYSIS, COMPLETE (UACMP) WITH MICROSCOPIC
Bilirubin Urine: NEGATIVE
Glucose, UA: NEGATIVE mg/dL
Ketones, ur: NEGATIVE mg/dL
Nitrite: NEGATIVE
PROTEIN: 30 mg/dL — AB
Specific Gravity, Urine: 1.013 (ref 1.005–1.030)
WBC, UA: >50 WBC/hpf — ABNORMAL HIGH (ref 0–5)
pH: 6 (ref 5.0–8.0)

## 2018-10-31 LAB — POCT PREGNANCY, URINE: Preg Test, Ur: NEGATIVE

## 2018-10-31 MED ORDER — BUTALBITAL-APAP-CAFFEINE 50-325-40 MG PO TABS: 1.0000 | ORAL_TABLET | Freq: Four times a day (QID) | ORAL | 0 refills | Status: AC | PRN

## 2018-10-31 MED ORDER — SODIUM CHLORIDE 0.9% FLUSH: 3.0000 mL | Freq: Once | INTRAVENOUS | Status: DC

## 2018-10-31 NOTE — ED Provider Notes (Signed)
[facility] Emergency Department Provider Note   ____________________________________________    I have reviewed the triage vital signs and the nursing notes.   HISTORY  Chief Complaint Dizziness     HPI Kylie Martin is a 28 y.o. female who presents with complaints of near syncopal episode.  Patient reports that she has had a headache over the last several days and has been taking Excedrin with little relief.  She reports a long history of headaches and this is not atypical for her.  No neuro deficits.  No chest pain or palpitations.  Today she went to work although felt like she should have stayed home and felt lightheaded while she was walking down the hallway.  No LOC.  Currently she feels well and has no complaints except for continued mild headache.  No dizziness.  Past Medical History:  Diagnosis Date  . Anemia   . HSV-2 (herpes simplex virus 2) infection   . Sepsis (HCC)   . Trichomonas     Patient Active Problem List   Diagnosis Date Noted  . E. coli sepsis (HCC) 12/06/2017  . Pyelonephritis, acute 12/06/2017  . 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 Sepsis (HCC) 11/21/2017    Past Surgical History:  Procedure Laterality Date  . WISDOM TOOTH EXTRACTION      Prior to Admission medications   Medication Sig Start Date End Date Taking? Authorizing Provider  butalbital-acetaminophen-caffeine (FIORICET, ESGIC) [phone] MG tablet Take 1-2 tablets by mouth every 6 (six) hours as needed for headache. 10/31/18 10/31/19  Jene Every, MD  etonogestrel-ethinyl estradiol (NUVARING) 0.12-0.015 MG/24HR vaginal ring Insert vaginally and leave in place for 3 consecutive weeks, then remove for 1 week. 02/04/18   Oswaldo Conroy, CNM  ondansetron (ZOFRAN-ODT) 4 MG disintegrating tablet Take 1 tablet (4 mg total) by mouth every 8 (eight) hours as needed for nausea or vomiting. 10/07/18   Lajean Manes, MD  oseltamivir (TAMIFLU) 75 MG capsule Starting today, take 1  capsule by mouth twice a day for 5 days. 10/07/18   Lajean Manes, MD  trimethoprim (TRIMPEX) 100 MG tablet Take 100 mg by mouth daily.     Michiel Cowboy A, PA-C     Allergies Penicillins  Family History  Problem Relation Age of Onset  . Hypertension Father   . Diabetes Father   . COPD Sister        born with RSV has immune problems with lung infections  . Stroke Maternal Uncle   . Hypertension Paternal Aunt   . Hypertension Maternal Grandfather   . Cancer Maternal Grandfather        lung  . Cancer Paternal Grandfather        lung  . Hypertension Paternal Aunt   . Cancer Maternal Grandmother        lung cancer  . Stroke Paternal Grandmother     Social History Social History   Tobacco Use  . Smoking status: Former Smoker    Types: E-cigarettes, Cigarettes    Last attempt to quit: 03/23/2010    Years since quitting: 8.6  . Smokeless tobacco: Never Used  Substance Use Topics  . Alcohol use: Yes    Comment: socially  . Drug use: No    Review of Systems  Constitutional: No fever/chills Eyes: No visual changes.  ENT: No sore throat. Cardiovascular: Denies chest pain. Respiratory: Denies shortness of breath. Gastrointestinal: No abdominal pain.  No nausea, no vomiting.   Genitourinary: Negative for dysuria. Musculoskeletal: Negative  for back pain. Skin: Negative for rash. Neurological: As above   ____________________________________________   PHYSICAL EXAM:  VITAL SIGNS: ED Triage Vitals  Enc Vitals Group     BP 10/31/18 1523 (!) 150/96     Pulse Rate 10/31/18 1523 84     Resp 10/31/18 1523 18     Temp 10/31/18 1523 98.9 F (37.2 C)     Temp Source 10/31/18 1523 Oral     SpO2 10/31/18 1523 99 %     Weight 10/31/18 1524 83.9 kg (185 lb)     Height 10/31/18 1524 1.676 m (5\' 6" )     Head Circumference --      Peak Flow --      Pain Score 10/31/18 1524 5     Pain Loc --      Pain Edu? --      Excl. in GC? --     Constitutional: Alert and oriented.    Eyes: Conjunctivae are normal.  PERRLA, EOMI Head: Atraumatic. Nose: No congestion/rhinnorhea. Mouth/Throat: Mucous membranes are moist.    Cardiovascular: Normal rate, regular rhythm. Grossly normal heart sounds.  Good peripheral circulation. Respiratory: Normal respiratory effort.  No retractions. Lungs CTAB.   Musculoskeletal Warm and well perfused Neurologic:  Normal speech and language. No gross focal neurologic deficits are appreciated.  Skin:  Skin is warm, dry and intact. No rash noted. Psychiatric: Mood and affect are normal. Speech and behavior are normal.  ____________________________________________   LABS (all labs ordered are listed, but only abnormal results are displayed)  Labs Reviewed  BASIC METABOLIC PANEL - Abnormal; Notable for the following components:      Result Value   Potassium 3.3 (*)    Glucose, Bld 105 (*)    Calcium 8.8 (*)    All other components within normal limits  URINALYSIS, COMPLETE (UACMP) WITH MICROSCOPIC - Abnormal; Notable for the following components:   Color, Urine YELLOW (*)    APPearance CLOUDY (*)    Hgb urine dipstick SMALL (*)    Protein, ur 30 (*)    Leukocytes, UA LARGE (*)    WBC, UA >50 (*)    Bacteria, UA MANY (*)    All other components within normal limits  CBC  POC URINE PREG, ED  POCT PREGNANCY, URINE   ____________________________________________  EKG  ED ECG REPORT I, Jene Every, the attending physician, personally viewed and interpreted this ECG.  Date: 10/31/2018  Rhythm: normal sinus rhythm QRS Axis: normal Intervals: normal ST/T Wave abnormalities: normal Narrative Interpretation: no evidence of acute ischemia  ____________________________________________  RADIOLOGY  None ____________________________________________   PROCEDURES  Procedure(s) performed: No  Procedures   Critical Care performed: No ____________________________________________   INITIAL IMPRESSION / ASSESSMENT  AND PLAN / ED COURSE  Pertinent labs & imaging results that were available during my care of the patient were reviewed by me and considered in my medical decision making (see chart for details).  Patient feels well in the emergency department, no lightheadedness here.  I suspect near syncopal episode related to continued headache, not feeling well but trying to work.  I recommended rest, increase fluids, should likely stay home tomorrow.  She has had success with Fioricet in the past will Rx this.    ____________________________________________   FINAL CLINICAL IMPRESSION(S) / ED DIAGNOSES  Final diagnoses:  Near syncope        Note:  This document was prepared using Dragon voice recognition software and may include unintentional dictation errors.  Jene EveryKinner, Linsy Ehresman, MD 10/31/18 [id number]

## 2018-10-31 NOTE — ED Triage Notes (Addendum)
Pt states she was at work and had a near syncopal episode today. Pt c/o dizziness with hypertension and was told her HR was 120, 84 at present. Denies any chest pain. Pt states she is on daily abx for chronic UTI and has been septic from it before pt denies any painful urination of changes with that. States she has had a HA for the past 4 days.

## 2018-11-15 ENCOUNTER — Other Ambulatory Visit: Payer: Self-pay | Admitting: Maternal Newborn

## 2018-11-15 DIAGNOSIS — Z3009 Encounter for other general counseling and advice on contraception: Secondary | ICD-10-CM

## 2018-11-16 ENCOUNTER — Telehealth: Payer: Self-pay | Admitting: Obstetrics and Gynecology

## 2018-11-16 NOTE — Telephone Encounter (Signed)
Patient is calling needing refills on Birth control. There is no note for patient to schedule an appointment for annual at this time. Could you please review and advise patient about refills

## 2018-11-16 NOTE — Telephone Encounter (Signed)
Pt needs annual exam

## 2018-11-16 NOTE — Telephone Encounter (Signed)
Pt aware one month nuvaring called in and needs annual.  Tx'd to [facility] to schedule.

## 2018-11-21 ENCOUNTER — Ambulatory Visit: Payer: Managed Care, Other (non HMO) | Admitting: Obstetrics and Gynecology

## 2018-11-21 NOTE — Progress Notes (Deleted)
PCP:  Patient, No Pcp Per   No chief complaint on file.    HPI:      [name] is a 28 y.o. [phone] who LMP was No LMP recorded. (Menstrual status: IUD)., presents today for her annual examination.  Her menses are {norm/abn:715}, lasting {number:22536} days.  Dysmenorrhea {dysmen:716}. She {does:18564} have intermenstrual bleeding.  Sex activity: {sex active:315163}.  Last Pap: {ZMCE:022336122}  Results were: {norm/abn:16707::"no abnormalities"} /neg HPV DNA *** Hx of STDs: HSV, trich  There is no FH of breast cancer. There is no FH of ovarian cancer. The patient {does:18564} do self-breast exams.  Tobacco use: {tob:20664} Alcohol use: {Alcohol:11675} No drug use.  Exercise: {exercise:31265}  She {does:18564} get adequate calcium and Vitamin D in her diet.   Past Medical History:  Diagnosis Date  . Anemia   . HSV-2 (herpes simplex virus 2) infection   . Sepsis (HCC)   . Trichomonas     Past Surgical History:  Procedure Laterality Date  . WISDOM TOOTH EXTRACTION      Family History  Problem Relation Age of Onset  . Hypertension Father   . Diabetes Father   . COPD Sister        born with RSV has immune problems with lung infections  . Stroke Maternal Uncle   . Hypertension Paternal Aunt   . Hypertension Maternal Grandfather   . Cancer Maternal Grandfather        lung  . Cancer Paternal Grandfather        lung  . Hypertension Paternal Aunt   . Cancer Maternal Grandmother        lung cancer  . Stroke Paternal Grandmother     Social History   Socioeconomic History  . Marital status: Single    Spouse name: Not on file  . Number of children: 1  . Years of education: 65  . Highest education level: Not on file  Occupational History  . Occupation: Glass blower/designer  Social Needs  . Financial resource strain: Not on file  . Food insecurity:    Worry: Not on file    Inability: Not on file  . Transportation needs:    Medical: Not on file    Non-medical:  Not on file  Tobacco Use  . Smoking status: Former Smoker    Types: E-cigarettes, Cigarettes    Last attempt to quit: 03/23/2010    Years since quitting: 8.6  . Smokeless tobacco: Never Used  Substance and Sexual Activity  . Alcohol use: Yes    Comment: socially  . Drug use: No  . Sexual activity: Yes    Birth control/protection: Inserts    Comment: Nuvaring  Lifestyle  . Physical activity:    Days per week: Not on file    Minutes per session: Not on file  . Stress: Not on file  Relationships  . Social connections:    Talks on phone: Not on file    Gets together: Not on file    Attends religious service: Not on file    Active member of club or organization: Not on file    Attends meetings of clubs or organizations: Not on file    Relationship status: Not on file  . Intimate partner violence:    Fear of current or ex partner: Not on file    Emotionally abused: Not on file    Physically abused: Not on file    Forced sexual activity: Not on file  Other Topics Concern  .  Not on file  Social History Narrative  . Not on file    Outpatient Medications Prior to Visit  Medication Sig Dispense Refill  . butalbital-acetaminophen-caffeine (FIORICET, ESGIC) 50-325-40 MG tablet Take 1-2 tablets by mouth every 6 (six) hours as needed for headache. 20 tablet 0  . ELURYNG 0.12-0.015 MG/24HR vaginal ring INSERT VAINALLY AND LEAVE IN PLACE FOR 3 CONSECUTIVE WEEKS, THEN REMOVE FOR 1 WEEK. 1 each 0  . ondansetron (ZOFRAN-ODT) 4 MG disintegrating tablet Take 1 tablet (4 mg total) by mouth every 8 (eight) hours as needed for nausea or vomiting. 5 tablet 0  . oseltamivir (TAMIFLU) 75 MG capsule Starting today, take 1 capsule by mouth twice a day for 5 days. 10 capsule 0  . trimethoprim (TRIMPEX) 100 MG tablet Take 100 mg by mouth daily.      No facility-administered medications prior to visit.       ROS:  Review of Systems BREAST: No symptoms   Objective: There were no vitals taken  for this visit.   OBGyn Exam  Results: No results found for this or any previous visit (from the past 24 hour(s)).  Assessment/Plan: No diagnosis found.  No orders of the defined types were placed in this encounter.            GYN counsel {counseling:16159}     F/U  No follow-ups on file.  Fahd Galea B. Jaynie Hitch, PA-C 11/21/2018 9:59 AM

## 2018-12-06 NOTE — Progress Notes (Signed)
PCP:  Patient, No Pcp Per   Chief Complaint  Patient presents with  . Gynecologic Exam     HPI:      [name] is a 28 y.o. [phone] who LMP was No LMP recorded (lmp unknown). (Menstrual status: Other)., presents today for her annual examination.  Her menses are absent with cont dosing of nuvaring. Dysmenorrhea none. She does have occas intermenstrual spotting.  Sex activity: not currently  Last Pap: not recent Hx of STDs: HSV, trichomonas No recent yeast vag sx. Hx of recurrent UTIs/renal stones, followed by urology.  There is no FH of breast cancer. There is no FH of ovarian cancer. The patient does do self-breast exams.  Tobacco use: The patient denies current or previous tobacco use. Alcohol use: social drinker No drug use.  Exercise: min active  She does get adequate calcium and Vitamin D in her diet. Gardasil completed.   Past Medical History:  Diagnosis Date  . Anemia   . HSV-2 (herpes simplex virus 2) infection   . Kidney stone   . Sepsis (HCC)   . Trichomonas   . UTI (urinary tract infection)    seeing urology  . Vaccine for human papilloma virus (HPV) types 6, 11, 16, and 18 administered     Past Surgical History:  Procedure Laterality Date  . WISDOM TOOTH EXTRACTION      Family History  Problem Relation Age of Onset  . Hypertension Father   . Diabetes Father   . COPD Sister        born with RSV has immune problems with lung infections  . Skin cancer Mother   . Stroke Maternal Uncle   . Hypertension Paternal Aunt   . Hypertension Maternal Grandfather   . Cancer Maternal Grandfather        lung  . Cancer Paternal Grandfather        lung  . Hypertension Paternal Aunt   . Cancer Maternal Grandmother        lung cancer  . Stroke Paternal Grandmother   . Breast cancer Neg Hx     Social History   Socioeconomic History  . Marital status: Single    Spouse name: Not on file  . Number of children: 1  . Years of education: 53  .  Highest education level: Not on file  Occupational History  . Occupation: Glass blower/designer  Social Needs  . Financial resource strain: Not on file  . Food insecurity:    Worry: Not on file    Inability: Not on file  . Transportation needs:    Medical: Not on file    Non-medical: Not on file  Tobacco Use  . Smoking status: Former Smoker    Types: E-cigarettes, Cigarettes    Last attempt to quit: 03/23/2010    Years since quitting: 8.7  . Smokeless tobacco: Never Used  Substance and Sexual Activity  . Alcohol use: Yes    Comment: socially  . Drug use: No  . Sexual activity: Not Currently    Birth control/protection: Inserts    Comment: Nuvaring  Lifestyle  . Physical activity:    Days per week: Not on file    Minutes per session: Not on file  . Stress: Not on file  Relationships  . Social connections:    Talks on phone: Not on file    Gets together: Not on file    Attends religious service: Not on file    Active member of club  or organization: Not on file    Attends meetings of clubs or organizations: Not on file    Relationship status: Not on file  . Intimate partner violence:    Fear of current or ex partner: Not on file    Emotionally abused: Not on file    Physically abused: Not on file    Forced sexual activity: Not on file  Other Topics Concern  . Not on file  Social History Narrative  . Not on file    Outpatient Medications Prior to Visit  Medication Sig Dispense Refill  . butalbital-acetaminophen-caffeine (FIORICET, ESGIC) 50-325-40 MG tablet Take 1-2 tablets by mouth every 6 (six) hours as needed for headache. 20 tablet 0  . ondansetron (ZOFRAN-ODT) 4 MG disintegrating tablet Take 1 tablet (4 mg total) by mouth every 8 (eight) hours as needed for nausea or vomiting. 5 tablet 0  . ELURYNG 0.12-0.015 MG/24HR vaginal ring INSERT VAINALLY AND LEAVE IN PLACE FOR 3 CONSECUTIVE WEEKS, THEN REMOVE FOR 1 WEEK. 1 each 0  . oseltamivir (TAMIFLU) 75 MG capsule Starting today,  take 1 capsule by mouth twice a day for 5 days. 10 capsule 0  . trimethoprim (TRIMPEX) 100 MG tablet Take 100 mg by mouth daily.      No facility-administered medications prior to visit.       ROS:  Review of Systems  Constitutional: Negative for fatigue, fever and unexpected weight change.  Respiratory: Negative for cough, shortness of breath and wheezing.   Cardiovascular: Negative for chest pain, palpitations and leg swelling.  Gastrointestinal: Negative for blood in stool, constipation, diarrhea, nausea and vomiting.  Endocrine: Negative for cold intolerance, heat intolerance and polyuria.  Genitourinary: Positive for dysuria and frequency. Negative for dyspareunia, flank pain, genital sores, hematuria, menstrual problem, pelvic pain, urgency, vaginal bleeding, vaginal discharge and vaginal pain.  Musculoskeletal: Negative for back pain, joint swelling and myalgias.  Skin: Negative for rash.  Neurological: Negative for dizziness, syncope, light-headedness, numbness and headaches.  Hematological: Negative for adenopathy.  Psychiatric/Behavioral: Positive for agitation and dysphoric mood. Negative for confusion, sleep disturbance and suicidal ideas. The patient is not nervous/anxious.    BREAST: No symptoms   Objective: BP 130/80   Pulse 83   Ht 5\' 6"  (1.676 m)   Wt 185 lb (83.9 kg)   LMP  (LMP Unknown)   BMI 29.86 kg/m    Physical Exam Constitutional:      Appearance: She is well-developed.  Genitourinary:     Vulva, vagina, cervix, uterus, right adnexa and left adnexa normal.     No vulval lesion or tenderness noted.     No vaginal discharge, erythema or tenderness.     No cervical polyp.     Uterus is not enlarged or tender.     No right or left adnexal mass present.     Right adnexa not tender.     Left adnexa not tender.  Neck:     Musculoskeletal: Normal range of motion.     Thyroid: No thyromegaly.  Cardiovascular:     Rate and Rhythm: Normal rate and  regular rhythm.     Heart sounds: Normal heart sounds. No murmur.  Pulmonary:     Effort: Pulmonary effort is normal.     Breath sounds: Normal breath sounds.  Chest:     Breasts:        Right: No mass, nipple discharge, skin change or tenderness.        Left: No mass, nipple discharge,  skin change or tenderness.  Abdominal:     Palpations: Abdomen is soft.     Tenderness: There is no abdominal tenderness. There is no guarding.  Musculoskeletal: Normal range of motion.  Neurological:     Mental Status: She is alert and oriented to person, place, and time.     Cranial Nerves: No cranial nerve deficit.  Psychiatric:        Behavior: Behavior normal.  Vitals signs reviewed.     Assessment/Plan: Encounter for annual routine gynecological examination  Cervical cancer screening - Plan: Cytology - PAP  Screening for STD (sexually transmitted disease) - Plan: Cytology - PAP  Encounter for surveillance of vaginal ring hormonal contraceptive device - Nuvaring RF.  - Plan: etonogestrel-ethinyl estradiol (ELURYNG) 0.12-0.015 MG/24HR vaginal ring  Meds ordered this encounter  Medications  . etonogestrel-ethinyl estradiol (ELURYNG) 0.12-0.015 MG/24HR vaginal ring    Sig: INSERT VAINALLY AND LEAVE IN PLACE FOR 3 CONSECUTIVE WEEKS, THEN REMOVE FOR 1 WEEK.    Dispense:  3 each    Refill:  4    Order Specific Question:   Supervising Provider    Answer:   Nadara Mustard [[id number]]             GYN counsel adequate intake of calcium and vitamin D, diet and exercise     F/U  Return in about 1 year (around 12/07/2019).  [place] B. Kynzlee Hucker, PA-C 12/07/2018 8:52 AM

## 2018-12-07 ENCOUNTER — Encounter: Payer: Self-pay | Admitting: Obstetrics and Gynecology

## 2018-12-07 ENCOUNTER — Other Ambulatory Visit (HOSPITAL_COMMUNITY)
Admission: RE | Admit: 2018-12-07 | Discharge: 2018-12-07 | Disposition: A | Discharge status: Home / Self Care | Payer: Managed Care, Other (non HMO) | Source: Ambulatory Visit | Attending: Obstetrics and Gynecology | Admitting: Obstetrics and Gynecology

## 2018-12-07 ENCOUNTER — Ambulatory Visit (INDEPENDENT_AMBULATORY_CARE_PROVIDER_SITE_OTHER): Payer: Managed Care, Other (non HMO) | Admitting: Obstetrics and Gynecology

## 2018-12-07 VITALS — BP 130/80 | HR 83 | Ht 66.0 in | Wt 185.0 lb

## 2018-12-07 DIAGNOSIS — Z01419 Encounter for gynecological examination (general) (routine) without abnormal findings: Secondary | ICD-10-CM

## 2018-12-07 DIAGNOSIS — Z124 Encounter for screening for malignant neoplasm of cervix: Secondary | ICD-10-CM

## 2018-12-07 DIAGNOSIS — Z113 Encounter for screening for infections with a predominantly sexual mode of transmission: Secondary | ICD-10-CM | POA: Insufficient documentation

## 2018-12-07 DIAGNOSIS — Z3044 Encounter for surveillance of vaginal ring hormonal contraceptive device: Secondary | ICD-10-CM

## 2018-12-07 MED ORDER — ETONOGESTREL-ETHINYL ESTRADIOL 0.12-0.015 MG/24HR VA RING: VAGINAL_RING | VAGINAL | 4 refills | Status: DC

## 2018-12-07 NOTE — Patient Instructions (Signed)
I value your feedback and entrusting us with your care. If you get a [facility] patient survey, I would appreciate you taking the time to let us know about your experience today. Thank you! 

## 2018-12-08 LAB — CYTOLOGY - PAP
Adequacy: ABSENT
Diagnosis: NEGATIVE

## 2019-01-26 NOTE — Telephone Encounter (Signed)
error 

## 2019-02-07 DIAGNOSIS — Z87448 Personal history of other diseases of urinary system: Secondary | ICD-10-CM | POA: Insufficient documentation

## 2019-02-07 DIAGNOSIS — Z87442 Personal history of urinary calculi: Secondary | ICD-10-CM | POA: Insufficient documentation

## 2019-05-02 ENCOUNTER — Other Ambulatory Visit: Payer: Self-pay

## 2019-05-02 DIAGNOSIS — N390 Urinary tract infection, site not specified: Secondary | ICD-10-CM

## 2019-05-08 ENCOUNTER — Ambulatory Visit: Payer: Managed Care, Other (non HMO) | Admitting: Urology

## 2019-08-19 IMAGING — CT CT ABD-PELV W/ CM
2 of 4 series · 16 of 46 positions shown, 18 images · IV contrast (iopamidol)
Comparison: None.

CLINICAL DATA: Acute abdominal pain. Fever. Recent antibiotics for
urinary tract infection.

EXAM:
CT ABDOMEN AND PELVIS WITH CONTRAST
TECHNIQUE: Multidetector CT imaging of the abdomen and pelvis was performed
using the standard protocol following bolus administration of
intravenous contrast.
CONTRAST:  100mL [id number] IOPAMIDOL ([id number]) INJECTION 61%

[Series 2: routine abd/pel with · axial · 0.80mm/px · z∈[-486,-66]mm · 13 of 92 slices shown, 15 images]
[im 4/92  soft-tissue]
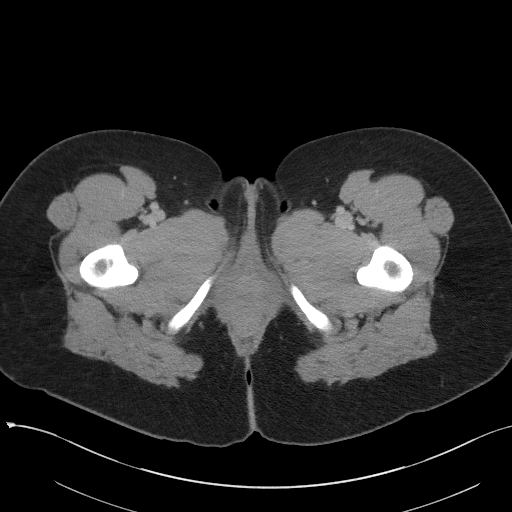
[im 4/92  bone]
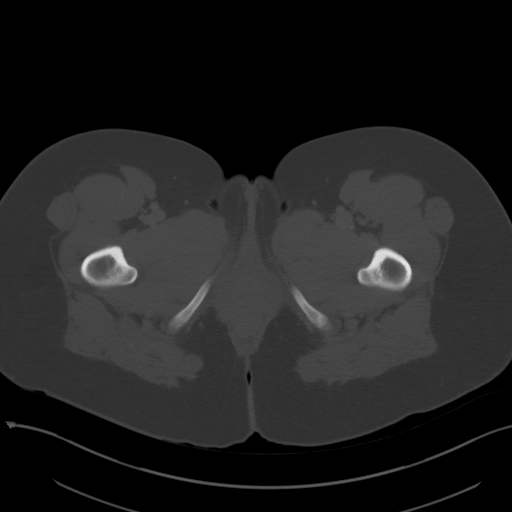
[im 12/92  soft-tissue]
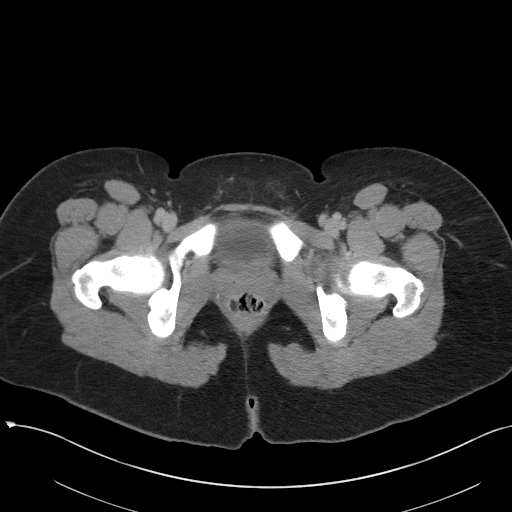
[im 20/92  soft-tissue]
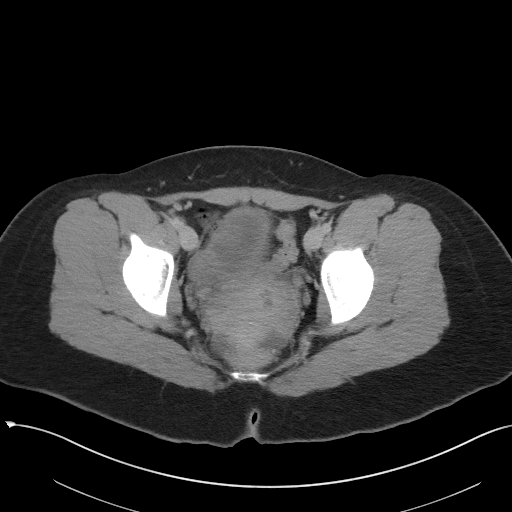
[im 24/92  soft-tissue]
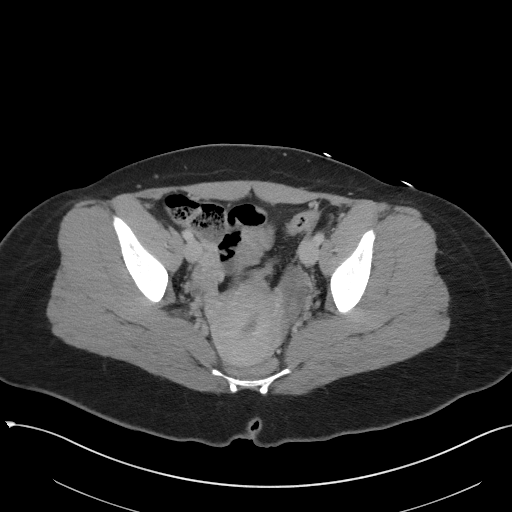
[im 32/92  soft-tissue]
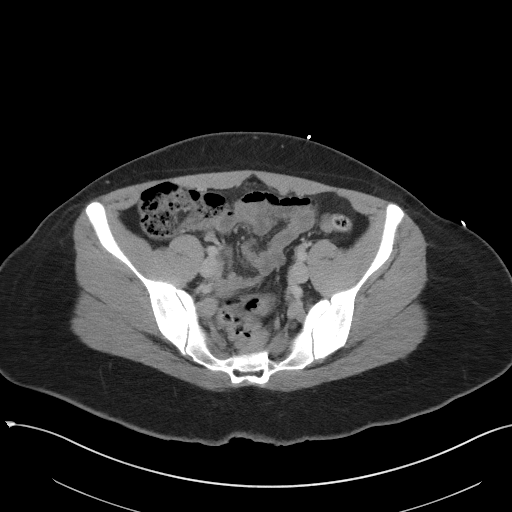
[im 40/92  soft-tissue]
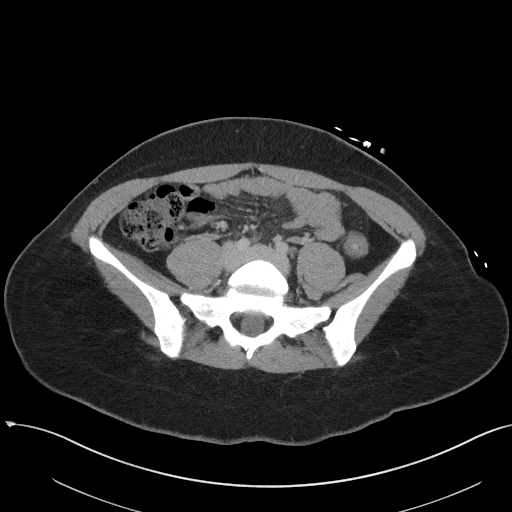
[im 48/92  soft-tissue]
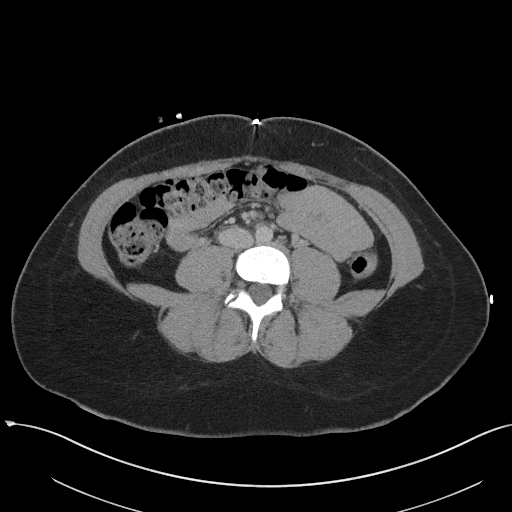
[im 52/92  soft-tissue]
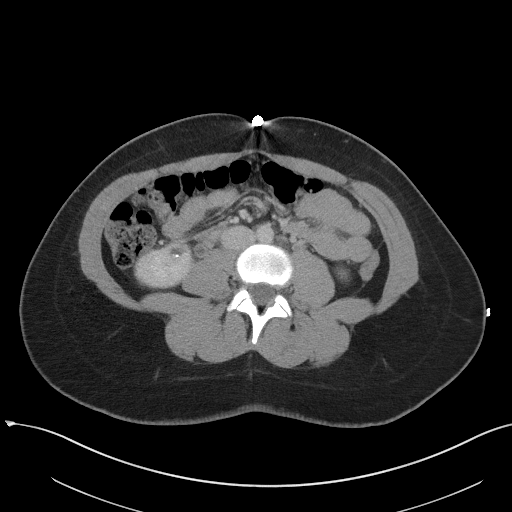
[im 60/92  soft-tissue]
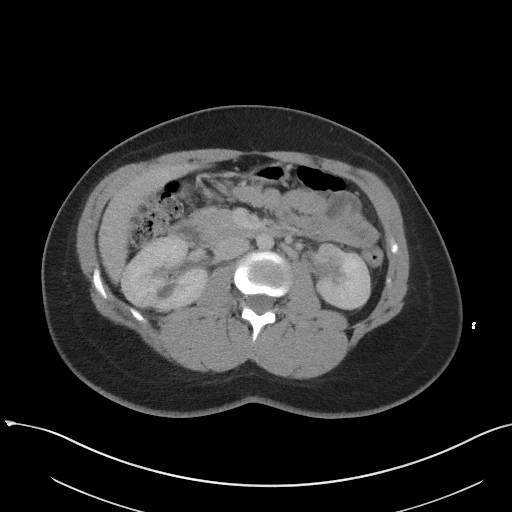
[im 60/92  bone]
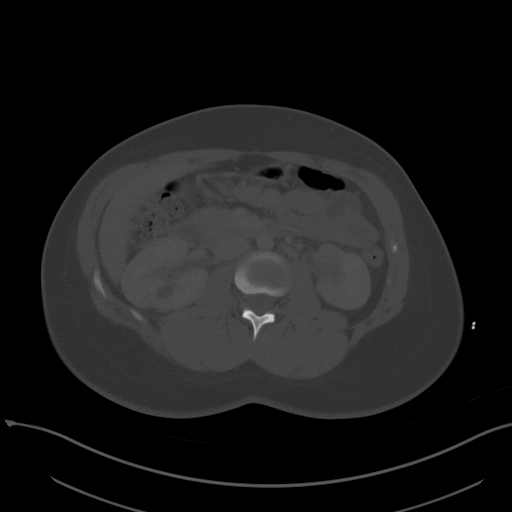
[im 68/92  soft-tissue]
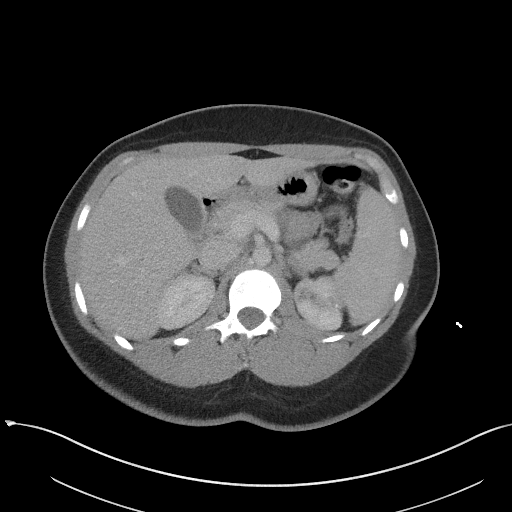
[im 72/92  soft-tissue]
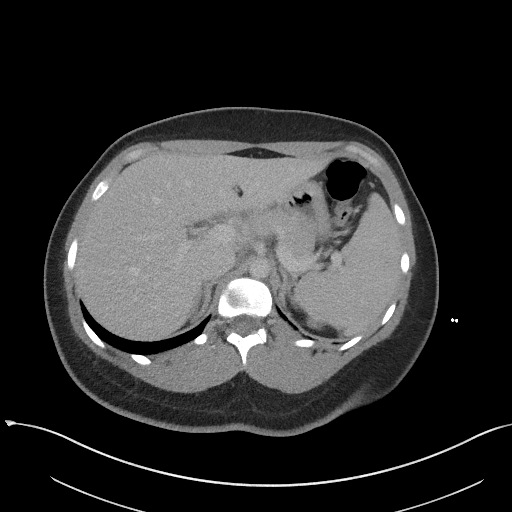
[im 80/92  soft-tissue]
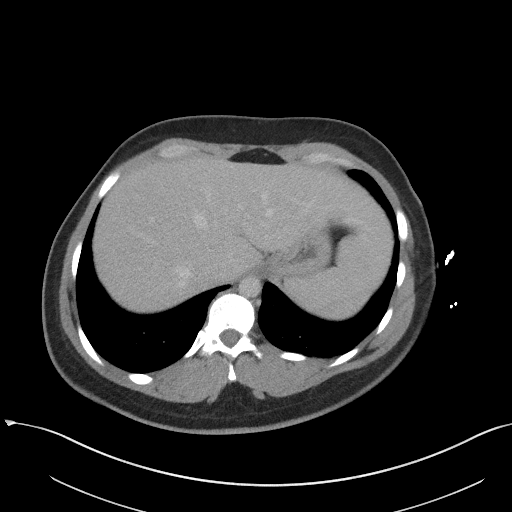
[im 88/92  soft-tissue]
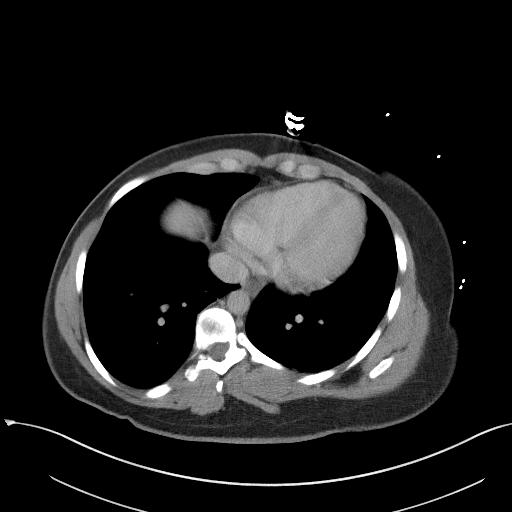

[Series 5: coronal st · coronal · 0.75mm/px · 3 of 82 slices shown]
[im 28/82  soft-tissue]
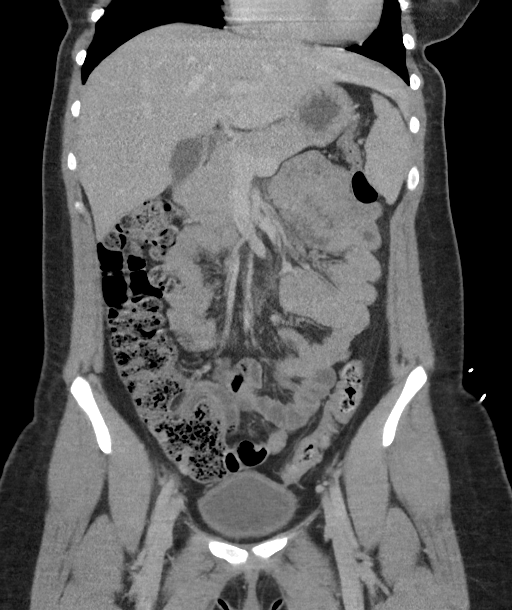
[im 37/82  soft-tissue]
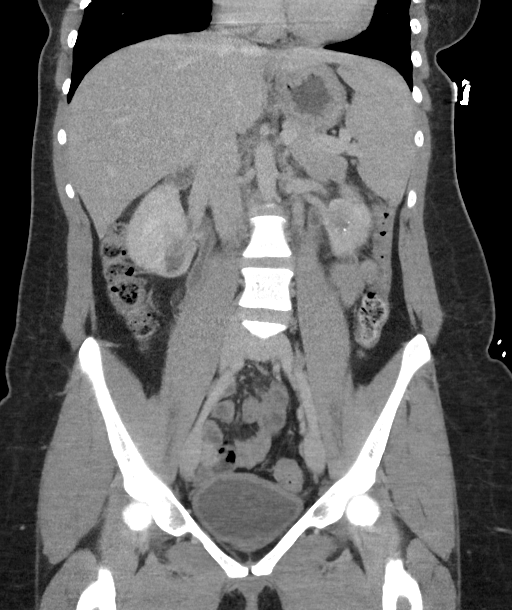
[im 46/82  soft-tissue]
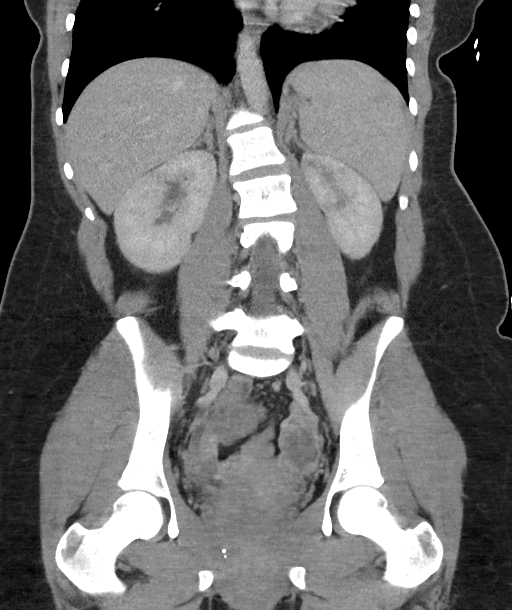

[16 of 46 positions shown; findings below may reference images not displayed]

FINDINGS: Lower chest: Minimal dependent left lower lobe atelectasis.

Hepatobiliary: 6 mm hypodensity in the right lobe of the liver too
small to characterize, likely cyst or hemangioma. Gallbladder
physiologically distended, no calcified stone. No biliary
dilatation.

Pancreas: No ductal dilatation or inflammation.

Spleen: Normal in size without focal abnormality.

Adrenals/Urinary Tract: No adrenal nodule.

Right pyelonephritis with heterogeneous renal enhancement and
ureteral thickening and enhancement. Mild right hydronephrosis. Mild
perinephric and periureteric edema. A 13 mm low-density structure in
the lower right kidney contains dependent calcification and may be a
calyceal diverticulum. 15 mm low-density in the mid posterior right
kidney and 11 mm low-density in the upper right kidney have adjacent
cortical thinning and are likely additional caliceal diverticula. No
perirenal abscess.

Left pyelonephritis with heterogeneous enhancement and ureteral
thickening and enhancement.. Left upper pole scarring with
parenchymal calcification. Mild left perinephric and periureteric
edema. Possible calyceal diverticulum in the mid left kidney
containing a calcification with adjacent cortical thinning.
Additional scarring in the anterior lower left kidney with
parenchymal calcifications. No perirenal abscess.

Minimally distended urinary bladder with wall thickening and mild
perivesicular stranding.

Stomach/Bowel: Stomach is within normal limits. Appendix appears
normal. No evidence of bowel wall thickening, distention, or
inflammatory changes.

Vascular/Lymphatic: Normal caliber abdominal aorta. Prominent
retroperitoneal nodes are likely reactive.

Reproductive: Retroverted uterus.  No adnexal mass.

Other: Small amount of free fluid in the pelvis may be reactive or
physiologic. No free air.

Musculoskeletal: Scoliotic curvature of the thoracolumbar spine.
There are no acute or suspicious osseous abnormalities.
IMPRESSION: 1. Urinary tract infection with cystitis and bilateral
pyelonephritis. No renal or perirenal abscess.
2. Bilateral renal calyceal diverticula, some which contain stones.
Mild left renal scarring.

## 2019-08-19 IMAGING — CR DG CHEST 2V
2 series · 3 of 3 positions shown · non-contrast
Comparison: October 14, 2007

CLINICAL DATA: Abdominal pain.

EXAM:
CHEST  2 VIEW

[Series 2: chest lat · 0.14mm/px · 2 of 2 slices shown]
[im 1/2]
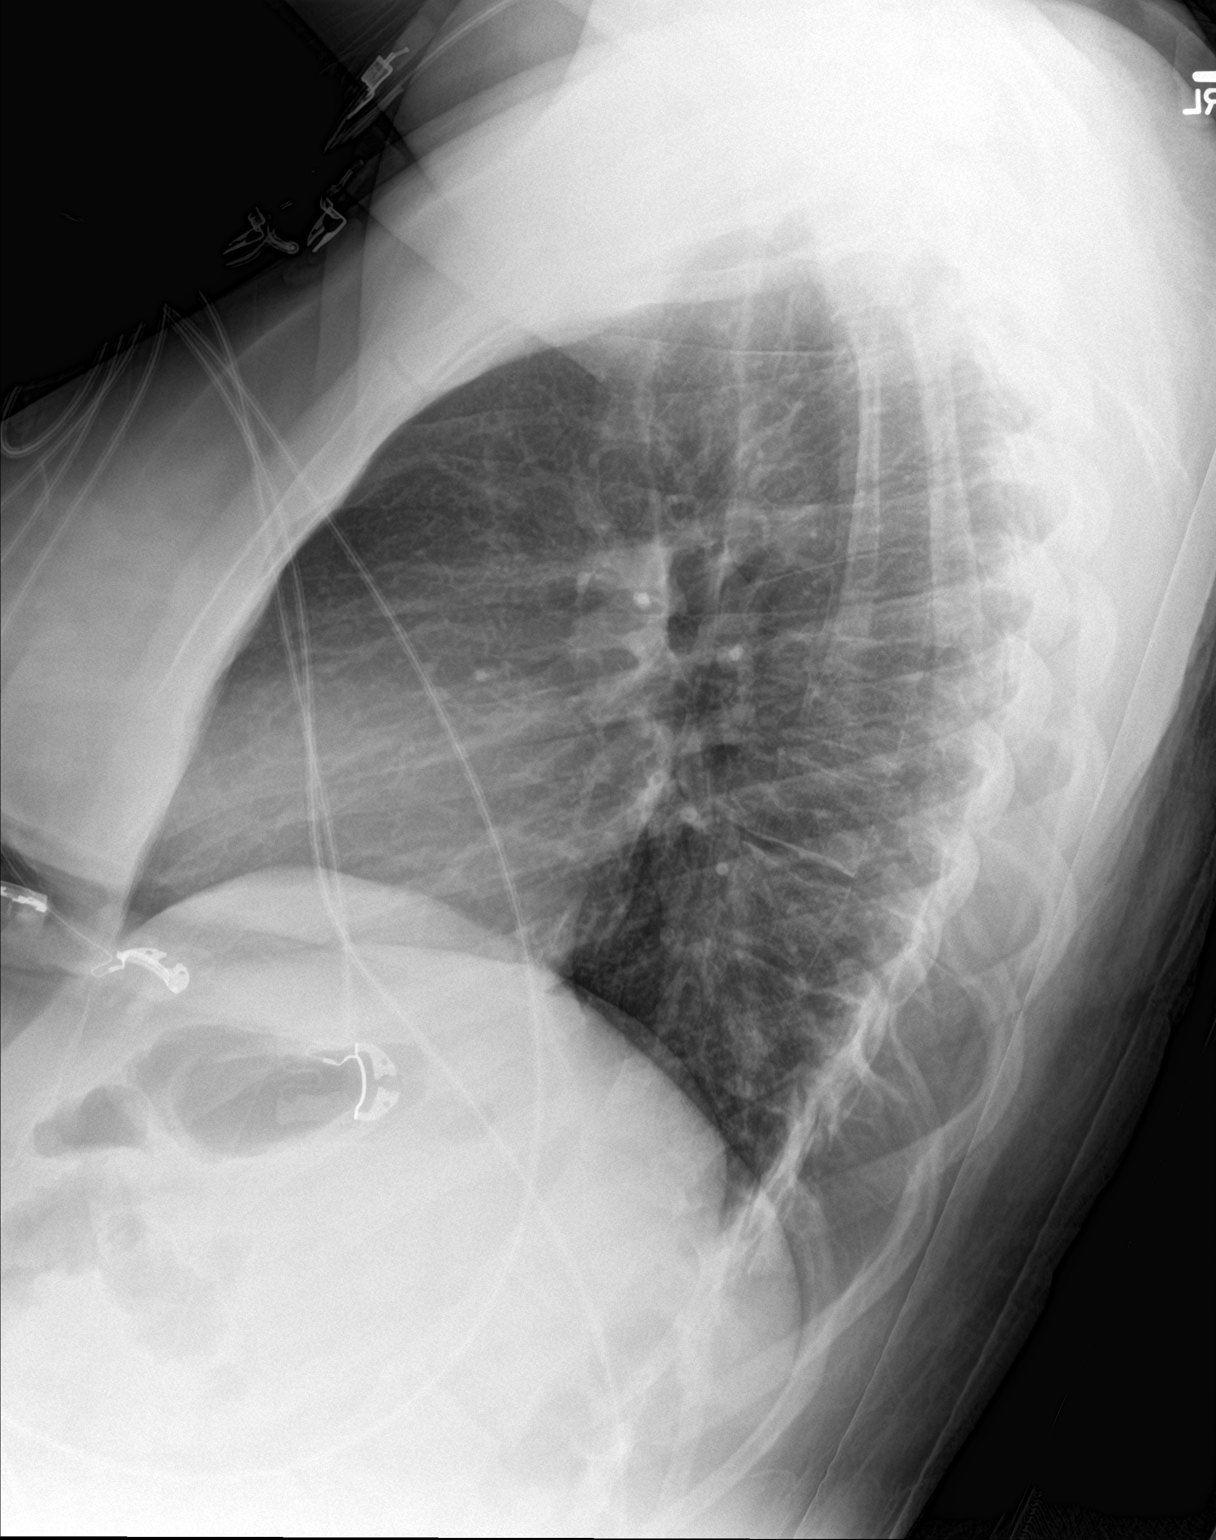
[im 2/2]
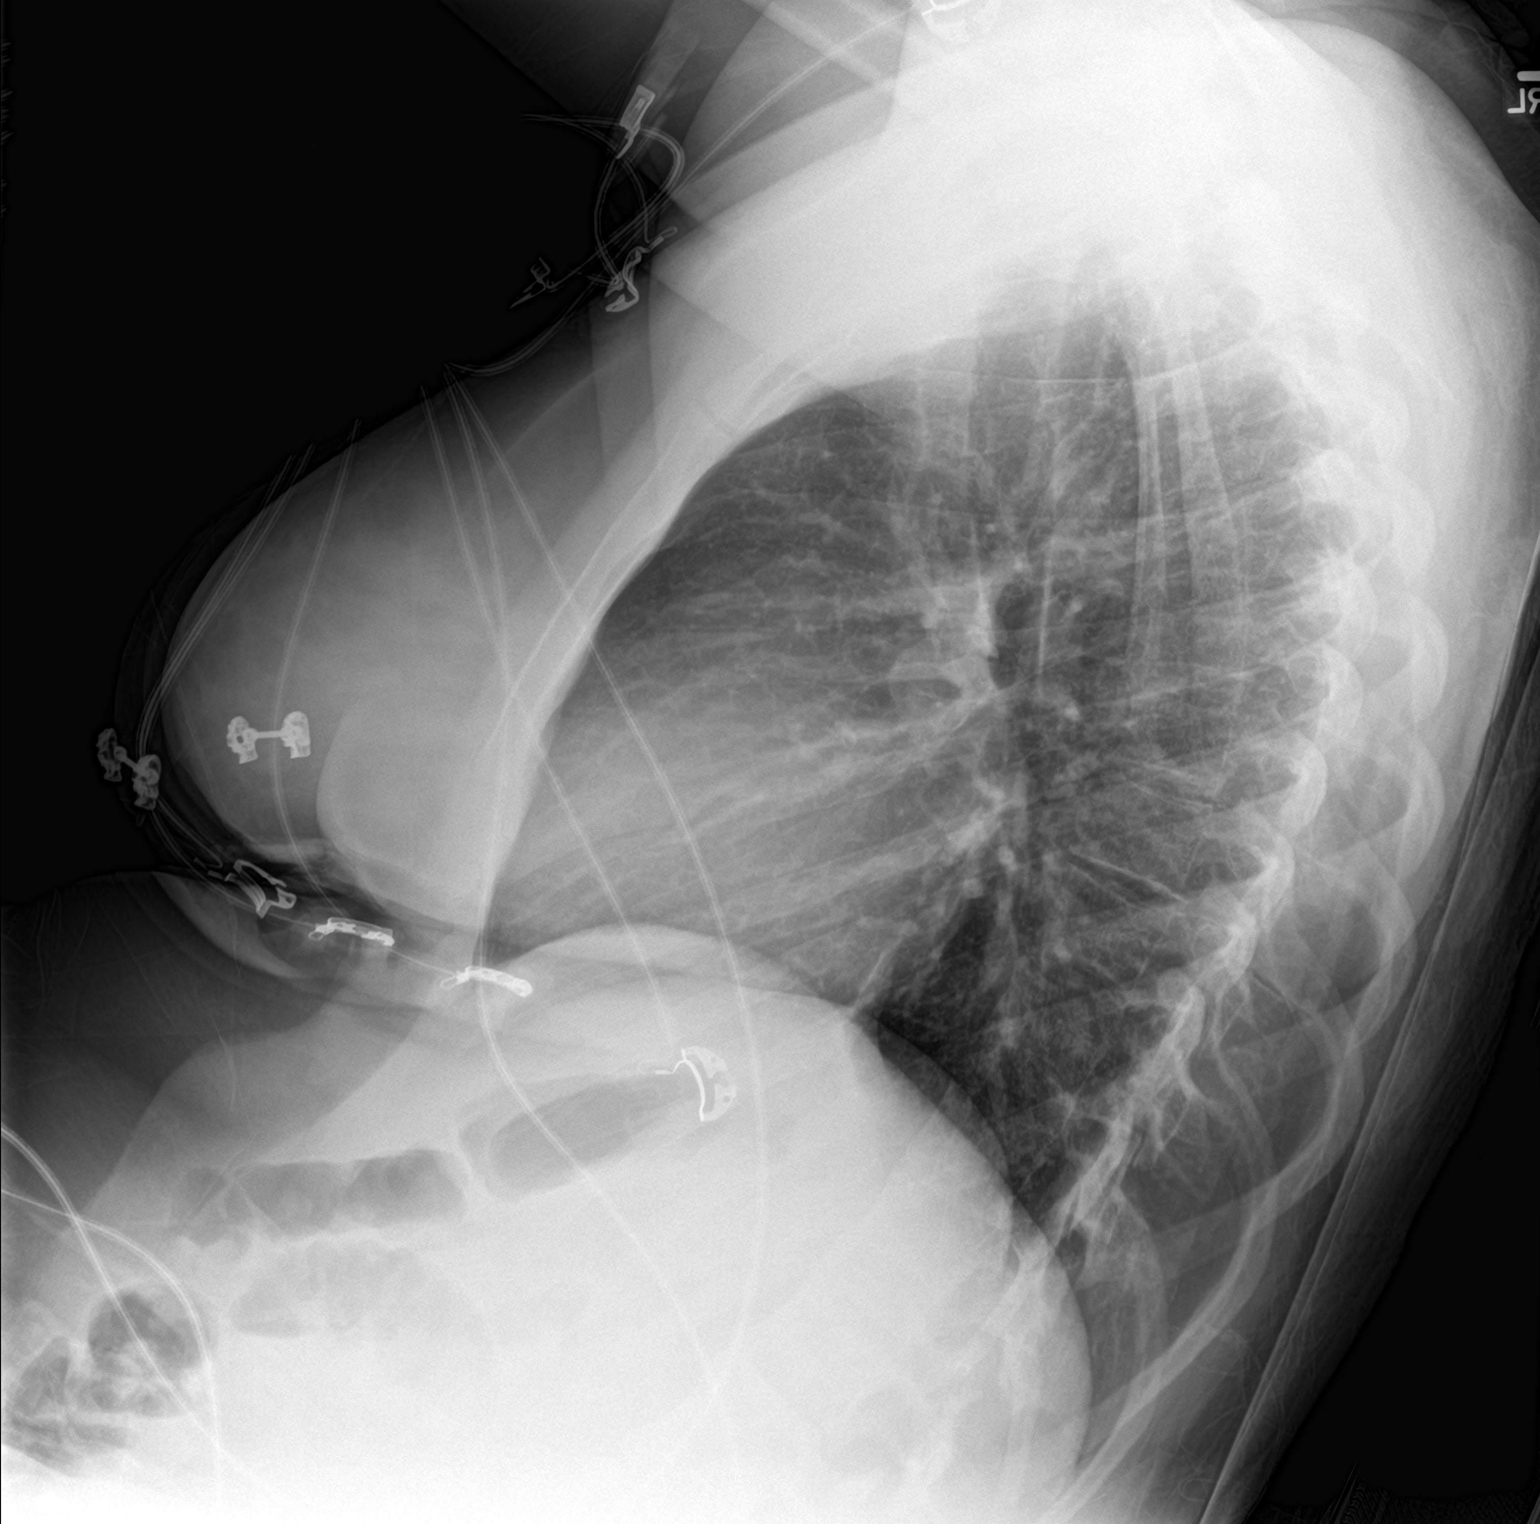

[chest ap]
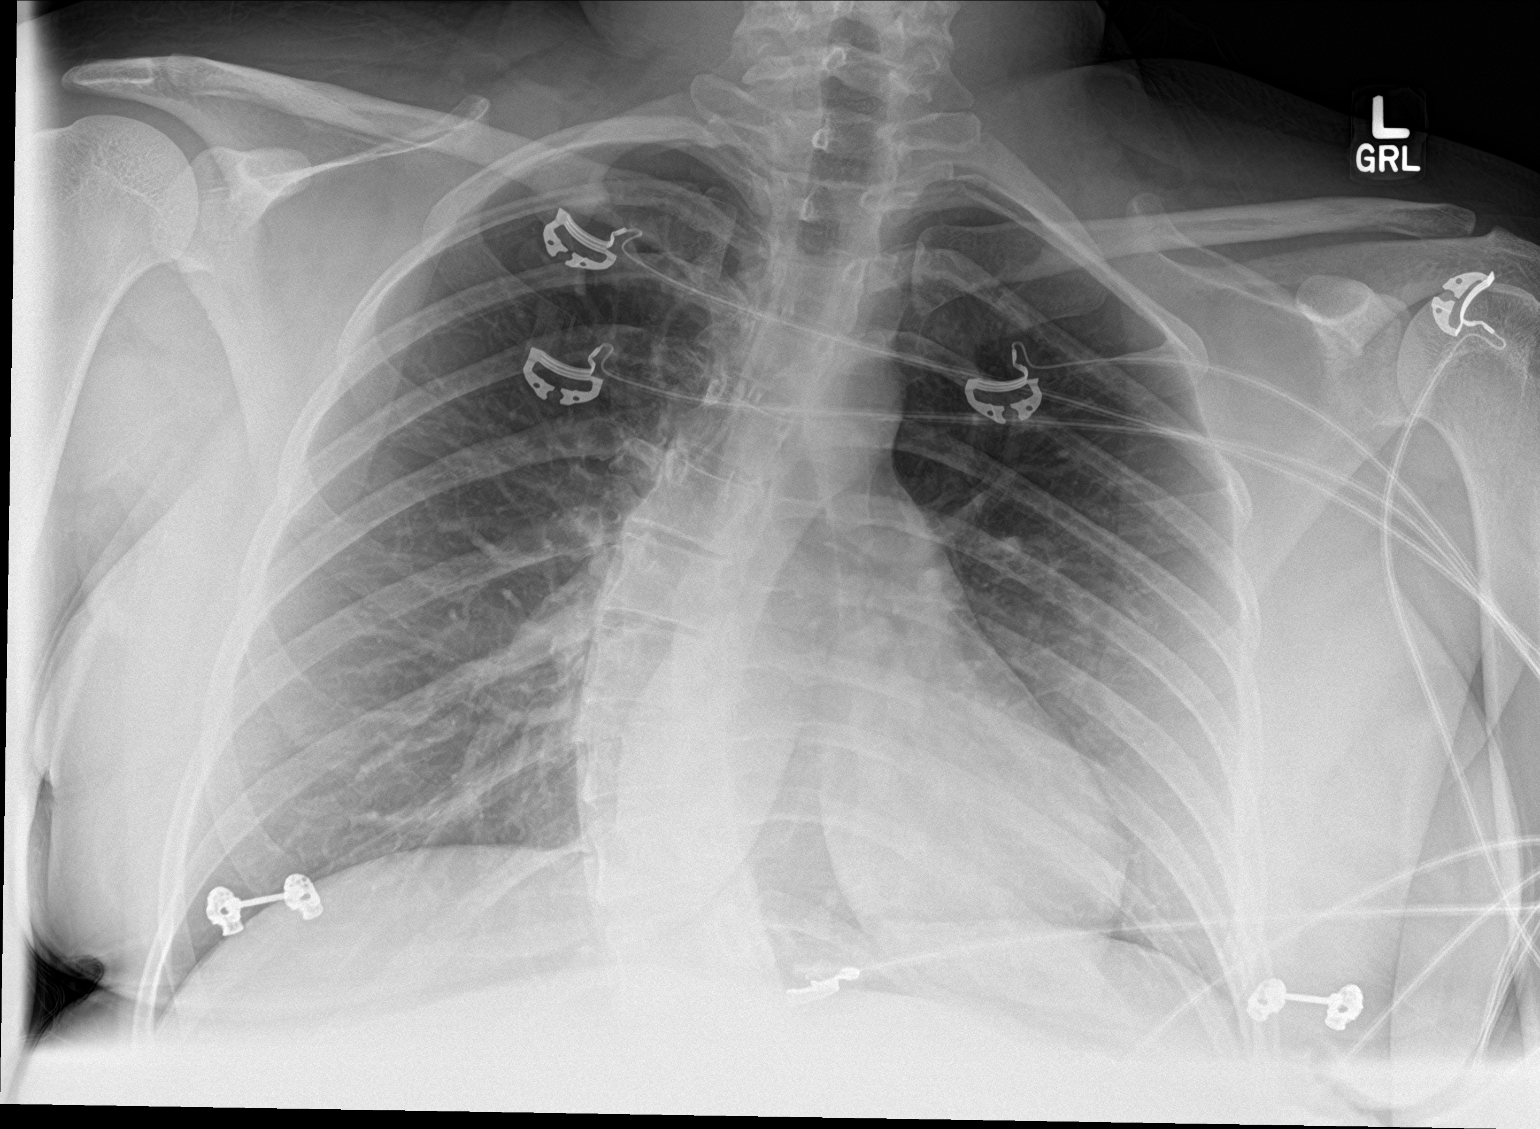

[3 of 3 positions shown; findings below may reference images not displayed]

FINDINGS: Scoliotic curvature of the thoracic spine. The heart, hila,
mediastinum, lungs, and pleura are otherwise unremarkable.
IMPRESSION: No active cardiopulmonary disease.

## 2019-09-05 ENCOUNTER — Other Ambulatory Visit: Payer: Self-pay | Admitting: Obstetrics and Gynecology

## 2019-09-05 DIAGNOSIS — Z3044 Encounter for surveillance of vaginal ring hormonal contraceptive device: Secondary | ICD-10-CM

## 2019-09-06 ENCOUNTER — Other Ambulatory Visit: Payer: Self-pay | Admitting: Obstetrics and Gynecology

## 2019-09-06 ENCOUNTER — Telehealth: Payer: Self-pay

## 2019-09-06 DIAGNOSIS — Z3044 Encounter for surveillance of vaginal ring hormonal contraceptive device: Secondary | ICD-10-CM

## 2019-09-06 MED ORDER — ETONOGESTREL-ETHINYL ESTRADIOL 0.12-0.015 MG/24HR VA RING: VAGINAL_RING | VAGINAL | 4 refills | Status: DC

## 2019-09-06 NOTE — Telephone Encounter (Signed)
Spoke w/pharmacy who states patient p/u rx's on these dates: 01/20/2019  qty 3 02/17/2019  qty 3 04/03/2019  qty 3 04/24/2019  qty 1 06/27/2019  qty 1 07/13/2019  qty 1 08/16/2019 qty 1

## 2019-09-06 NOTE — Telephone Encounter (Signed)
Patient states she is out of refills. Her AE was 12/07/2018. She is due to be changed in two days. Pharmacy advised her the refill request was denied. Cb#[phone]

## 2019-09-06 NOTE — Telephone Encounter (Signed)
Pls let pt know Rx RF eRxd to get her to 3/21 annual. Will get 1 month at a time due to her insurance.

## 2019-09-06 NOTE — Telephone Encounter (Signed)
Spoke w/patient. She states she has only received one at each p/u time. She does do continued dosing every 3 wks. Advised to contact pharmacy to discuss.

## 2019-09-06 NOTE — Telephone Encounter (Signed)
Spoke with pharmacist at [facility]. Both of [place] confused as to why pt is out of RF and dispensed #3 monthly for a couple months. Pt's insurance now only allows for 1 ring monthly, so Rx eRxd for #1 and RF #4 to get to 3/21 annual.

## 2019-09-06 NOTE — Progress Notes (Signed)
Rx RF nuvaring. See note/phone msg.

## 2019-09-06 NOTE — Telephone Encounter (Signed)
Pts pharmacy called and stated the same thing that was told to [place] is stating pt has been picking up a box of 3 until July where she started getting 1 at a time. Crystal has advised that I send this to [facility] for her to advised if pt can have a refill or not. Pt is stating she will need to replace the ring in about 2 days. Thank you.

## 2019-09-07 NOTE — Telephone Encounter (Signed)
Pt aware.

## 2019-12-14 ENCOUNTER — Ambulatory Visit: Payer: Managed Care, Other (non HMO) | Admitting: Advanced Practice Midwife

## 2019-12-21 ENCOUNTER — Ambulatory Visit: Payer: Managed Care, Other (non HMO) | Admitting: Advanced Practice Midwife

## 2020-01-31 ENCOUNTER — Ambulatory Visit (INDEPENDENT_AMBULATORY_CARE_PROVIDER_SITE_OTHER): Payer: PRIVATE HEALTH INSURANCE | Admitting: Advanced Practice Midwife

## 2020-01-31 ENCOUNTER — Encounter: Payer: Self-pay | Admitting: Advanced Practice Midwife

## 2020-01-31 ENCOUNTER — Other Ambulatory Visit (HOSPITAL_COMMUNITY)
Admission: RE | Admit: 2020-01-31 | Discharge: 2020-01-31 | Disposition: A | Discharge status: Home / Self Care | Payer: Managed Care, Other (non HMO) | Source: Ambulatory Visit | Attending: Advanced Practice Midwife | Admitting: Advanced Practice Midwife

## 2020-01-31 ENCOUNTER — Other Ambulatory Visit: Payer: Self-pay

## 2020-01-31 VITALS — BP 132/79 | HR 57 | Ht 66.0 in | Wt 179.0 lb

## 2020-01-31 DIAGNOSIS — Z124 Encounter for screening for malignant neoplasm of cervix: Secondary | ICD-10-CM

## 2020-01-31 DIAGNOSIS — Z01419 Encounter for gynecological examination (general) (routine) without abnormal findings: Secondary | ICD-10-CM | POA: Insufficient documentation

## 2020-01-31 DIAGNOSIS — Z113 Encounter for screening for infections with a predominantly sexual mode of transmission: Secondary | ICD-10-CM | POA: Diagnosis not present

## 2020-01-31 DIAGNOSIS — L659 Nonscarring hair loss, unspecified: Secondary | ICD-10-CM

## 2020-01-31 DIAGNOSIS — R635 Abnormal weight gain: Secondary | ICD-10-CM

## 2020-01-31 NOTE — Patient Instructions (Signed)
Health Maintenance, Female Adopting a healthy lifestyle and getting preventive care are important in promoting health and wellness. Ask your health care provider about:  The right schedule for you to have regular tests and exams.  Things you can do on your own to prevent diseases and keep yourself healthy. What should I know about diet, weight, and exercise? Eat a healthy diet   Eat a diet that includes plenty of vegetables, fruits, low-fat dairy products, and lean protein.  Do not eat a lot of foods that are high in solid fats, added sugars, or sodium. Maintain a healthy weight Body mass index (BMI) is used to identify weight problems. It estimates body fat based on height and weight. Your health care provider can help determine your BMI and help you achieve or maintain a healthy weight. Get regular exercise Get regular exercise. This is one of the most important things you can do for your health. Most adults should:  Exercise for at least 150 minutes each week. The exercise should increase your heart rate and make you sweat (moderate-intensity exercise).  Do strengthening exercises at least twice a week. This is in addition to the moderate-intensity exercise.  Spend less time sitting. Even light physical activity can be beneficial. Watch cholesterol and blood lipids Have your blood tested for lipids and cholesterol at 28 years of age, then have this test every 5 years. Have your cholesterol levels checked more often if:  Your lipid or cholesterol levels are high.  You are older than 28 years of age.  You are at high risk for heart disease. What should I know about cancer screening? Depending on your health history and family history, you may need to have cancer screening at various ages. This may include screening for:  Breast cancer.  Cervical cancer.  Colorectal cancer.  Skin cancer.  Lung cancer. What should I know about heart disease, diabetes, and high blood  pressure? Blood pressure and heart disease  High blood pressure causes heart disease and increases the risk of stroke. This is more likely to develop in people who have high blood pressure readings, are of African descent, or are overweight.  Have your blood pressure checked: ? Every 3-5 years if you are 18-39 years of age. ? Every year if you are 40 years old or older. Diabetes Have regular diabetes screenings. This checks your fasting blood sugar level. Have the screening done:  Once every three years after age 40 if you are at a normal weight and have a low risk for diabetes.  More often and at a younger age if you are overweight or have a high risk for diabetes. What should I know about preventing infection? Hepatitis B If you have a higher risk for hepatitis B, you should be screened for this virus. Talk with your health care provider to find out if you are at risk for hepatitis B infection. Hepatitis C Testing is recommended for:  Everyone born from 1945 through 1965.  Anyone with known risk factors for hepatitis C. Sexually transmitted infections (STIs)  Get screened for STIs, including gonorrhea and chlamydia, if: ? You are sexually active and are younger than 28 years of age. ? You are older than 28 years of age and your health care provider tells you that you are at risk for this type of infection. ? Your sexual activity has changed since you were last screened, and you are at increased risk for chlamydia or gonorrhea. Ask your health care provider if   you are at risk.  Ask your health care provider about whether you are at high risk for HIV. Your health care provider may recommend a prescription medicine to help prevent HIV infection. If you choose to take medicine to prevent HIV, you should first get tested for HIV. You should then be tested every 3 months for as long as you are taking the medicine. Pregnancy  If you are about to stop having your period (premenopausal) and  you may become pregnant, seek counseling before you get pregnant.  Take 400 to 800 micrograms (mcg) of folic acid every day if you become pregnant.  Ask for birth control (contraception) if you want to prevent pregnancy. Osteoporosis and menopause Osteoporosis is a disease in which the bones lose minerals and strength with aging. This can result in bone fractures. If you are 65 years old or older, or if you are at risk for osteoporosis and fractures, ask your health care provider if you should:  Be screened for bone loss.  Take a calcium or vitamin D supplement to lower your risk of fractures.  Be given hormone replacement therapy (HRT) to treat symptoms of menopause. Follow these instructions at home: Lifestyle  Do not use any products that contain nicotine or tobacco, such as cigarettes, e-cigarettes, and chewing tobacco. If you need help quitting, ask your health care provider.  Do not use street drugs.  Do not share needles.  Ask your health care provider for help if you need support or information about quitting drugs. Alcohol use  Do not drink alcohol if: ? Your health care provider tells you not to drink. ? You are pregnant, may be pregnant, or are planning to become pregnant.  If you drink alcohol: ? Limit how much you use to 0-1 drink a day. ? Limit intake if you are breastfeeding.  Be aware of how much alcohol is in your drink. In the [place] one drink equals one 12 oz bottle of beer (355 mL), one 5 oz glass of wine (148 mL), or one 1 oz glass of hard liquor (44 mL). General instructions  Schedule regular health, dental, and eye exams.  Stay current with your vaccines.  Tell your health care provider if: ? You often feel depressed. ? You have ever been abused or do not feel safe at home. Summary  Adopting a healthy lifestyle and getting preventive care are important in promoting health and wellness.  Follow your health care provider's instructions about healthy  diet, exercising, and getting tested or screened for diseases.  Follow your health care provider's instructions on monitoring your cholesterol and blood pressure. This information is not intended to replace advice given to you by your health care provider. Make sure you discuss any questions you have with your health care provider. Document Revised: 09/14/2018 Document Reviewed: 09/14/2018 Elsevier Patient Education  2020 [employer]  

## 2020-01-31 NOTE — Progress Notes (Signed)
Gynecology Annual Exam   PCP: Patient, No Pcp Per  Chief Complaint:  Chief Complaint  Patient presents with  . Gynecologic Exam    Irregular menses, started new pills 2 weeks ago    History of Present Illness: Patient is a 29 y.o. [id number] presents for annual exam. The patient has complaint today of having brown spotting while on vaginal ring. She was using ring continuously without having periods and in the past month began having spotting. She saw [facility] and received an initial prescription of triphasic OCP. She would like to continue on that for now.   She has been unable to lose abdominal weight despite healthy diet and regular physical activity. She also has complaint of hair loss. We discussed checking thyroid function.  LMP: No LMP recorded. (Menstrual status: Other).  Intermenstrual Bleeding: not applicable Postcoital Bleeding: no Dysmenorrhea: not applicable  The patient is sexually active. She currently uses OCP (estrogen/progesterone) for contraception. She denies dyspareunia.  The patient does perform self breast exams.  There is no notable family history of breast or ovarian cancer in her family.  The patient wears seatbelts: yes.   The patient has regular exercise: She goes to the gym regularly and she is a runner. She admits healthy lifestyle diet, hydration and sleep.    The patient denies current symptoms of depression.    Review of Systems: Review of Systems  Constitutional: Negative for chills and fever.  HENT: Negative for congestion, ear discharge, ear pain, hearing loss, sinus pain and sore throat.   Eyes: Negative for blurred vision and double vision.  Respiratory: Negative for cough, shortness of breath and wheezing.   Cardiovascular: Negative for chest pain, palpitations and leg swelling.  Gastrointestinal: Negative for abdominal pain, blood in stool, constipation, diarrhea, heartburn, melena, nausea and vomiting.  Genitourinary: Negative for dysuria,  flank pain, frequency, hematuria and urgency.  Musculoskeletal: Negative for back pain, joint pain and myalgias.  Skin: Negative for itching and rash.  Neurological: Negative for dizziness, tingling, tremors, sensory change, speech change, focal weakness, seizures, loss of consciousness, weakness and headaches.  Endo/Heme/Allergies: Negative for environmental allergies. Does not bruise/bleed easily.  Psychiatric/Behavioral: Negative for depression, hallucinations, memory loss, substance abuse and suicidal ideas. The patient is not nervous/anxious and does not have insomnia.     Past Medical History:  Patient Active Problem List   Diagnosis Date Noted  . E. coli sepsis (HCC) 12/06/2017  . Pyelonephritis, acute 12/06/2017  . 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 Sepsis (HCC) 11/21/2017    Past Surgical History:  Past Surgical History:  Procedure Laterality Date  . WISDOM TOOTH EXTRACTION      Gynecologic History:  No LMP recorded. (Menstrual status: Other). Contraception: OCP (estrogen/progesterone) Last Pap: 2 years ago Results were: no abnormalities  She has a new partner since then so will repeat PAP today  Obstetric History: [id number]  Family History:  Family History  Problem Relation Age of Onset  . Hypertension Father   . Diabetes Father   . COPD Sister        born with RSV has immune problems with lung infections  . Skin cancer Mother   . Stroke Maternal Uncle   . Hypertension Paternal Aunt   . Hypertension Maternal Grandfather   . Cancer Maternal Grandfather        lung  . Cancer Paternal Grandfather        lung  . Hypertension Paternal Aunt   . Cancer Maternal Grandmother  lung cancer  . Stroke Paternal Grandmother   . Breast cancer Neg Hx     Social History:  Social History   Socioeconomic History  . Marital status: Single    Spouse name: Not on file  . Number of children: 1  . Years of education: 65  . Highest education level: Not on file  Occupational  History  . Occupation: [employer]  Tobacco Use  . Smoking status: Former Smoker    Types: E-cigarettes, Cigarettes    Quit date: 03/23/2010    Years since quitting: 9.8  . Smokeless tobacco: Never Used  Substance and Sexual Activity  . Alcohol use: Yes    Comment: socially  . Drug use: No  . Sexual activity: Yes    Birth control/protection: Pill    Comment: Nuvaring  Other Topics Concern  . Not on file  Social History Narrative  . Not on file   Social Determinants of Health   Financial Resource Strain:   . Difficulty of Paying Living Expenses:   Food Insecurity:   . Worried About Charity fundraiser in the Last Year:   . Arboriculturist in the Last Year:   Transportation Needs:   . Film/video editor (Medical):   Marland Kitchen Lack of Transportation (Non-Medical):   Physical Activity:   . Days of Exercise per Week:   . Minutes of Exercise per Session:   Stress:   . Feeling of Stress :   Social Connections:   . Frequency of Communication with Friends and Family:   . Frequency of Social Gatherings with Friends and Family:   . Attends Religious Services:   . Active Member of Clubs or Organizations:   . Attends Archivist Meetings:   Marland Kitchen Marital Status:   Intimate Partner Violence:   . Fear of Current or Ex-Partner:   . Emotionally Abused:   Marland Kitchen Physically Abused:   . Sexually Abused:     Allergies:  Allergies  Allergen Reactions  . Penicillins Rash    "only with penicillin injection"    Medications: Prior to Admission medications   Medication Sig Start Date End Date Taking? Authorizing Provider  Norgestimate-Ethinyl Estradiol Triphasic 0.18/0.215/0.25 MG-25 MCG tab Take by mouth. 01/18/20 01/17/21 Yes [provider]    Physical Exam Vitals: Blood pressure 132/79, pulse (!) 57, height 5\' 6"  (1.676 m), weight 179 lb (81.2 kg).  General: NAD HEENT: normocephalic, anicteric Thyroid: no enlargement, no palpable nodules Pulmonary: No increased work  of breathing, CTAB Cardiovascular: RRR, distal pulses 2+ Breast: Breast symmetrical, no tenderness, no palpable nodules or masses, no skin or nipple retraction present, no nipple discharge.  No axillary or supraclavicular lymphadenopathy. Abdomen: NABS, soft, non-tender, non-distended.  Umbilicus without lesions.  No hepatomegaly, splenomegaly or masses palpable. No evidence of hernia  Genitourinary:  External: Normal external female genitalia.  Normal urethral meatus, normal Bartholin's and Skene's glands.    Vagina: Normal vaginal mucosa, no evidence of prolapse.    Cervix: Grossly normal in appearance, no bleeding, no CMT  Uterus: Non-enlarged, mobile, normal contour.    Adnexa: ovaries non-enlarged, no adnexal masses  Rectal: deferred  Lymphatic: no evidence of inguinal lymphadenopathy Extremities: no edema, erythema, or tenderness Neurologic: Grossly intact Psychiatric: mood appropriate, affect full   Assessment: 29 y.o. [id number] routine annual exam  Plan: Problem List Items Addressed This Visit    None    Visit Diagnoses    Well woman exam with routine gynecological exam    -  Primary   Relevant Orders   HIV Antibody (routine testing w rflx) (Completed)   RPR Qual (Completed)   Hepatitis B surface antigen (Completed)   Cytology - PAP   Screen for sexually transmitted diseases       Relevant Orders   HIV Antibody (routine testing w rflx) (Completed)   RPR Qual (Completed)   Hepatitis B surface antigen (Completed)   Cytology - PAP   Cervical cancer screening       Relevant Orders   Cytology - PAP   Unintended weight gain       Relevant Orders   Thyroid Panel With TSH (Completed)   Hair loss       Relevant Orders   Thyroid Panel With TSH (Completed)      1) STI screening  was offered and accepted  2)  ASCCP guidelines and rationale discussed.  Patient opts for every 3 years screening interval. PAP done today- new partner since last PAP 2 years ago.  3)  Contraception - the patient is currently using  OCP (estrogen/progesterone).  She is happy with her current form of contraception and plans to continue  4) Routine healthcare maintenance including cholesterol, diabetes screening discussed: Thyroid panel ordered today for weight gain and hair loss  5) Return in about 1 year (around 01/30/2021) for annual established gyn.   Tresea Mall, CNM [facility] 02/01/2020, 11:13 AM

## 2020-02-01 LAB — THYROID PANEL WITH TSH
Free Thyroxine Index: 2.1 (ref 1.2–4.9)
T3 Uptake Ratio: 22 % — ABNORMAL LOW (ref 24–39)
T4, Total: 9.7 ug/dL (ref 4.5–12.0)
TSH: 3.02 u[IU]/mL (ref 0.450–4.500)

## 2020-02-01 LAB — HEPATITIS B SURFACE ANTIGEN: Hepatitis B Surface Ag: NEGATIVE

## 2020-02-01 LAB — HIV ANTIBODY (ROUTINE TESTING W REFLEX): HIV Screen 4th Generation wRfx: NONREACTIVE

## 2020-02-01 LAB — RPR QUALITATIVE: RPR Ser Ql: NONREACTIVE

## 2020-02-05 LAB — CYTOLOGY - PAP
Chlamydia: NEGATIVE
Comment: NEGATIVE
Comment: NEGATIVE
Comment: NEGATIVE
Comment: NORMAL
Diagnosis: UNDETERMINED — AB
High risk HPV: POSITIVE — AB
Neisseria Gonorrhea: NEGATIVE
Trichomonas: NEGATIVE

## 2020-03-06 ENCOUNTER — Emergency Department: Payer: PRIVATE HEALTH INSURANCE

## 2020-03-06 ENCOUNTER — Emergency Department
Admission: EM | Admit: 2020-03-06 | Discharge: 2020-03-06 | Disposition: A | Discharge status: Home / Self Care | Payer: PRIVATE HEALTH INSURANCE | Attending: Emergency Medicine | Admitting: Emergency Medicine

## 2020-03-06 ENCOUNTER — Other Ambulatory Visit: Payer: Self-pay

## 2020-03-06 ENCOUNTER — Encounter: Payer: Self-pay | Admitting: Medical Oncology

## 2020-03-06 DIAGNOSIS — R55 Syncope and collapse: Secondary | ICD-10-CM | POA: Insufficient documentation

## 2020-03-06 DIAGNOSIS — N390 Urinary tract infection, site not specified: Secondary | ICD-10-CM | POA: Insufficient documentation

## 2020-03-06 DIAGNOSIS — Z793 Long term (current) use of hormonal contraceptives: Secondary | ICD-10-CM | POA: Insufficient documentation

## 2020-03-06 DIAGNOSIS — Z79899 Other long term (current) drug therapy: Secondary | ICD-10-CM | POA: Insufficient documentation

## 2020-03-06 DIAGNOSIS — Z87891 Personal history of nicotine dependence: Secondary | ICD-10-CM | POA: Diagnosis not present

## 2020-03-06 LAB — CBC
HCT: 35.4 % — ABNORMAL LOW (ref 36.0–46.0)
Hemoglobin: 11.8 g/dL — ABNORMAL LOW (ref 12.0–15.0)
MCH: 28.6 pg (ref 26.0–34.0)
MCHC: 33.3 g/dL (ref 30.0–36.0)
MCV: 85.7 fL (ref 80.0–100.0)
Platelets: 206 10*3/uL (ref 150–400)
RBC: 4.13 MIL/uL (ref 3.87–5.11)
RDW: 12.9 % (ref 11.5–15.5)
WBC: 9 10*3/uL (ref 4.0–10.5)
nRBC: 0 % (ref 0.0–0.2)

## 2020-03-06 LAB — URINALYSIS, COMPLETE (UACMP) WITH MICROSCOPIC
Bilirubin Urine: NEGATIVE
Glucose, UA: NEGATIVE mg/dL
Hgb urine dipstick: NEGATIVE
Ketones, ur: NEGATIVE mg/dL
Nitrite: NEGATIVE
Protein, ur: NEGATIVE mg/dL
Specific Gravity, Urine: 1.014 (ref 1.005–1.030)
WBC, UA: >50 WBC/hpf — ABNORMAL HIGH (ref 0–5)
pH: 8 (ref 5.0–8.0)

## 2020-03-06 LAB — URINE DRUG SCREEN, QUALITATIVE (ARMC ONLY)
Amphetamines, Ur Screen: NOT DETECTED
Barbiturates, Ur Screen: NOT DETECTED
Benzodiazepine, Ur Scrn: NOT DETECTED
Cannabinoid 50 Ng, Ur ~~LOC~~: NOT DETECTED
Cocaine Metabolite,Ur ~~LOC~~: NOT DETECTED
MDMA (Ecstasy)Ur Screen: NOT DETECTED
Methadone Scn, Ur: NOT DETECTED
Opiate, Ur Screen: NOT DETECTED
Phencyclidine (PCP) Ur S: NOT DETECTED
Tricyclic, Ur Screen: NOT DETECTED

## 2020-03-06 LAB — COMPREHENSIVE METABOLIC PANEL
ALT: 11 U/L (ref 0–44)
AST: 18 U/L (ref 15–41)
Albumin: 2.9 g/dL — ABNORMAL LOW (ref 3.5–5.0)
Alkaline Phosphatase: 71 U/L (ref 38–126)
Anion gap: 8 (ref 5–15)
BUN: 8 mg/dL (ref 6–20)
CO2: 24 mmol/L (ref 22–32)
Calcium: 7.9 mg/dL — ABNORMAL LOW (ref 8.9–10.3)
Chloride: 109 mmol/L (ref 98–111)
Creatinine, Ser: 1.04 mg/dL — ABNORMAL HIGH (ref 0.44–1.00)
GFR calc Af Amer: >60 mL/min (ref >60)
GFR calc non Af Amer: >60 mL/min (ref >60)
Glucose, Bld: 107 mg/dL — ABNORMAL HIGH (ref 70–99)
Potassium: 4.1 mmol/L (ref 3.5–5.1)
Sodium: 141 mmol/L (ref 135–145)
Total Bilirubin: 0.4 mg/dL (ref 0.3–1.2)
Total Protein: 5.7 g/dL — ABNORMAL LOW (ref 6.5–8.1)

## 2020-03-06 LAB — MAGNESIUM: Magnesium: 1.7 mg/dL (ref 1.7–2.4)

## 2020-03-06 LAB — ETHANOL: Alcohol, Ethyl (B): <10 mg/dL (ref <10)

## 2020-03-06 LAB — POC URINE PREG, ED: Preg Test, Ur: NEGATIVE

## 2020-03-06 LAB — TSH: TSH: 2.976 u[IU]/mL (ref 0.350–4.500)

## 2020-03-06 MED ORDER — CEPHALEXIN 500 MG PO CAPS: 500.0000 mg | ORAL_CAPSULE | Freq: Two times a day (BID) | ORAL | 0 refills | Status: DC

## 2020-03-06 NOTE — ED Notes (Signed)
Pt states she remembered feeling "off" while horse back riding so she removed herself from horse. Pt denies falling off horse, but does not recall passing out. All pt remembers is waking up in hospital room. Mother is at bedside with patient at this time.

## 2020-03-06 NOTE — ED Notes (Signed)
Urine pregnancy negative at this time.

## 2020-03-06 NOTE — ED Triage Notes (Addendum)
[place] horse [place] via ems where she was found by staff unresponsive on the ground. Pt arrives to ED with blank stare and when approached by staff becomes resistant. Pt will not speak to staff. CBG with EMS was 174 initial SBP was 67. IV started and 2L fluids administered pta

## 2020-03-06 NOTE — ED Provider Notes (Signed)
[facility] Emergency Department Provider Note   ____________________________________________    I have reviewed the triage vital signs and the nursing notes.   HISTORY  Chief Complaint Loss of Consciousness     HPI Kylie Martin is a 29 y.o. female with a history as noted below who presents after syncopal episode.  Patient reports she was in the heat and humidity today, riding horses, and doing chores around the barn.  She was riding a horse when she became acutely thirsty, was getting off of the horse and started to feel lightheaded.  Was able to get down sit against a fence when she apparently syncopized per friends.  EMS arrived and gave her IV fluids after noting low blood pressure.  Upon arrival to the ED patient still feeling somewhat woozy but rapidly improved.  She denies chest pain.  No palpitations.  She [place] [employer] 1 year [place].  No neuro deficits.  No history of seizures.  No seizure-like activity reported.  Past Medical History:  Diagnosis Date  . Anemia   . HSV-2 (herpes simplex virus 2) infection   . Kidney stone   . Sepsis (HCC)   . Trichomonas   . UTI (urinary tract infection)    seeing urology  . Vaccine for human papilloma virus (HPV) types 6, 11, 16, and 18 administered     Patient Active Problem List   Diagnosis Date Noted  . E. coli sepsis (HCC) 12/06/2017  . Pyelonephritis, acute 12/06/2017  . 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 Sepsis (HCC) 11/21/2017    Past Surgical History:  Procedure Laterality Date  . WISDOM TOOTH EXTRACTION      Prior to Admission medications   Medication Sig Start Date End Date Taking? Authorizing Provider  cephALEXin (KEFLEX) 500 MG capsule Take 1 capsule (500 mg total) by mouth 2 (two) times daily. 03/06/20   Jene Every, MD  Norgestimate-Ethinyl Estradiol Triphasic 0.18/0.215/0.25 MG-25 MCG tab Take by mouth. 01/18/20 01/17/21  [provider]      Allergies Penicillins  Family History  Problem Relation Age of Onset  . Hypertension Father   . Diabetes Father   . COPD Sister        born with RSV has immune problems with lung infections  . Skin cancer Mother   . Stroke Maternal Uncle   . Hypertension Paternal Aunt   . Hypertension Maternal Grandfather   . Cancer Maternal Grandfather        lung  . Cancer Paternal Grandfather        lung  . Hypertension Paternal Aunt   . Cancer Maternal Grandmother        lung cancer  . Stroke Paternal Grandmother   . Breast cancer Neg Hx     Social History Social History   Tobacco Use  . Smoking status: Former Smoker    Types: E-cigarettes, Cigarettes    Quit date: 03/23/2010    Years since quitting: 9.9  . Smokeless tobacco: Never Used  Substance Use Topics  . Alcohol use: Yes    Comment: socially  . Drug use: No    Review of Systems  Constitutional: No fever/chills Eyes: No visual changes.  ENT: No sore throat. Cardiovascular: Denies chest pain. Respiratory: Denies shortness of breath. Gastrointestinal: No abdominal pain.  .   Genitourinary: Negative for dysuria. Musculoskeletal: Negative for back pain. Skin: Negative for rash. Neurological: Negative for headaches   ____________________________________________   PHYSICAL EXAM:  VITAL  SIGNS: ED Triage Vitals  Enc Vitals Group     BP      Pulse      Resp      Temp      Temp src      SpO2      Weight      Height      Head Circumference      Peak Flow      Pain Score      Pain Loc      Pain Edu?      Excl. in GC?     Constitutional: Alert and oriented.   Head: Atraumatic. Nose: No congestion/rhinnorhea. Mouth/Throat: Mucous membranes are moist.    Cardiovascular: Normal rate, regular rhythm. Grossly normal heart sounds.  Good peripheral circulation. Respiratory: Normal respiratory effort.  No retractions. Lungs CTAB. Gastrointestinal: Soft and nontender. No distention.  No CVA  tenderness.  Musculoskeletal: No lower extremity tenderness nor edema.  Warm and well perfused Neurologic:  Normal speech and language. No gross focal neurologic deficits are appreciated.  Skin:  Skin is warm, dry and intact. No rash noted. Psychiatric: Mood and affect are normal. Speech and behavior are normal.  ____________________________________________   LABS (all labs ordered are listed, but only abnormal results are displayed)  Labs Reviewed  CBC - Abnormal; Notable for the following components:      Result Value   Hemoglobin 11.8 (*)    HCT 35.4 (*)    All other components within normal limits  COMPREHENSIVE METABOLIC PANEL - Abnormal; Notable for the following components:   Glucose, Bld 107 (*)    Creatinine, Ser 1.04 (*)    Calcium 7.9 (*)    Total Protein 5.7 (*)    Albumin 2.9 (*)    All other components within normal limits  URINALYSIS, COMPLETE (UACMP) WITH MICROSCOPIC - Abnormal; Notable for the following components:   Color, Urine YELLOW (*)    APPearance CLOUDY (*)    Leukocytes,Ua SMALL (*)    WBC, UA >50 (*)    Bacteria, UA MANY (*)    All other components within normal limits  ETHANOL  TSH  MAGNESIUM  URINE DRUG SCREEN, QUALITATIVE ([facility] ONLY)  POC URINE PREG, ED   ____________________________________________  EKG  ED ECG REPORT I, Jene Every, the attending physician, personally viewed and interpreted this ECG.  Date: 03/06/2020  Rhythm: normal sinus rhythm QRS Axis: normal Intervals: normal ST/T Wave abnormalities: normal Narrative Interpretation: no evidence of acute ischemia  ____________________________________________  RADIOLOGY  Chest x-ray unremarkable, reviewed by me CT head unremarkable ____________________________________________   PROCEDURES  Procedure(s) performed: yes  .1-3 Lead EKG Interpretation Performed by: Jene Every, MD Authorized by: Jene Every, MD     Interpretation: normal     ECG rate  assessment: normal     Rhythm: sinus rhythm     Ectopy: none     Conduction: normal       Critical Care performed: No ____________________________________________   INITIAL IMPRESSION / ASSESSMENT AND PLAN / ED COURSE  Pertinent labs & imaging results that were available during my care of the patient were reviewed by me and considered in my medical decision making (see chart for details).  Patient presents after what sounds like syncopal episode.  Differential includes seizure however no seizure activity reported.  Similar syncopal episode 1 year ago.  Patient without chest pain or palpitations.  Placed on the cardiac monitor, labs pending, UDS, CT head, chest x-ray   Lab work  is overall quite reassuring, mild hypocalcemia however this appears chronic for her.  BUN/creatinine ratio is reassuring.  White blood cell count is normal.  EKG is unremarkable.  CT head normal  Chest x-ray demonstrates scoliosis but otherwise normal, reviewed by me  No evidence of arrhythmia, patient is feeling at her baseline and would like to go home.  Medication for admission at this point, close outpatient follow-up, strict return precautions discussed    ____________________________________________   FINAL CLINICAL IMPRESSION(S) / ED DIAGNOSES  Final diagnoses:  Syncope and collapse  Lower urinary tract infectious disease        Note:  This document was prepared using Dragon voice recognition software and may include unintentional dictation errors.   Lavonia Drafts, MD 03/06/20 1451

## 2020-04-11 ENCOUNTER — Other Ambulatory Visit: Payer: Self-pay

## 2020-04-11 ENCOUNTER — Ambulatory Visit (INDEPENDENT_AMBULATORY_CARE_PROVIDER_SITE_OTHER): Payer: BC Managed Care – PPO

## 2020-04-11 ENCOUNTER — Ambulatory Visit
Admission: EM | Admit: 2020-04-11 | Discharge: 2020-04-11 | Disposition: A | Discharge status: Home / Self Care | Payer: BC Managed Care – PPO | Attending: Internal Medicine | Admitting: Internal Medicine

## 2020-04-11 DIAGNOSIS — R0602 Shortness of breath: Secondary | ICD-10-CM

## 2020-04-11 MED ORDER — ALBUTEROL SULFATE HFA 108 (90 BASE) MCG/ACT IN AERS
2.0000 | INHALATION_SPRAY | RESPIRATORY_TRACT | 0 refills | Status: DC | PRN
Start: 2020-04-11 — End: 2021-03-26

## 2020-04-11 NOTE — ED Triage Notes (Signed)
Patient states that she was discharged from [facility] in [place] yesterday. States that she was admitted for Pyelonephritis and given levaquin IV for 1 day. Patient is currently taking levaquin PO now. Reports that she has been noticing this shortness of breath since yesterday and has been constant. Patient reports that she was running high fevers while admitted.

## 2020-04-11 NOTE — ED Provider Notes (Signed)
[facility]    CSN: [id number] Arrival date & time: 04/11/20  1416      History   Chief Complaint Chief Complaint  Patient presents with  . Shortness of Breath    HPI Kylie Martin is a 29 y.o. female who comes to the urgent care with shortness of breath and left-sided chest discomfort.  Patient was discharged from the person [facility] yesterday after being admitted for acute pyelonephritis.  Patient received IV fluids in the hospital.  After being discharged she says she has noticed worsening shortness of breath since yesterday.  Shortness of breath is worse when she lays flat.  It gets better when she sits up or stands up.  She is short of breath at rest and on exertion.  No swelling in the lower extremities.  No chest pain.  No radiation of pain into the arm.  She continues to have some subjective fever and chills.   No history of heart disease.  HPI  Past Medical History:  Diagnosis Date  . Anemia   . HSV-2 (herpes simplex virus 2) infection   . Kidney stone   . Sepsis (HCC)   . Trichomonas   . UTI (urinary tract infection)    seeing urology  . Vaccine for human papilloma virus (HPV) types 6, 11, 16, and 18 administered     Patient Active Problem List   Diagnosis Date Noted  . E. coli sepsis (HCC) 12/06/2017  . Pyelonephritis, acute 12/06/2017  . 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 Sepsis (HCC) 11/21/2017    Past Surgical History:  Procedure Laterality Date  . WISDOM TOOTH EXTRACTION      OB History    Gravida  3   Para  1   Term  1   Preterm      AB  2   Living  1     SAB  2   TAB      Ectopic      Multiple      Live Births  1            Home Medications    Prior to Admission medications   Medication Sig Start Date End Date Taking? Authorizing Provider  levofloxacin (LEVAQUIN) 750 MG tablet Take 750 mg by mouth daily. 04/10/20  Yes [provider]  Norgestimate-Ethinyl Estradiol Triphasic  0.18/0.215/0.25 MG-25 MCG tab Take by mouth. 01/18/20 01/17/21 Yes [provider]  albuterol (VENTOLIN HFA) 108 (90 Base) MCG/ACT inhaler Inhale 2 puffs into the lungs every 4 (four) hours as needed for wheezing or shortness of breath. 04/11/20   Tina Temme, Britta Mccreedy, MD    Family History Family History  Problem Relation Age of Onset  . Hypertension Father   . Diabetes Father   . COPD Sister        born with RSV has immune problems with lung infections  . Skin cancer Mother   . Stroke Maternal Uncle   . Hypertension Paternal Aunt   . Hypertension Maternal Grandfather   . Cancer Maternal Grandfather        lung  . Cancer Paternal Grandfather        lung  . Hypertension Paternal Aunt   . Cancer Maternal Grandmother        lung cancer  . Stroke Paternal Grandmother   . Breast cancer Neg Hx     Social History Social History   Tobacco Use  . Smoking status: Former Smoker  Types: E-cigarettes, Cigarettes    Quit date: 03/23/2010    Years since quitting: 10.0  . Smokeless tobacco: Never Used  Vaping Use  . Vaping Use: Former  Substance Use Topics  . Alcohol use: Yes    Comment: socially  . Drug use: No     Allergies   Penicillins   Review of Systems Review of Systems  Eyes: Negative.   Respiratory: Positive for chest tightness and shortness of breath. Negative for cough and wheezing.   Gastrointestinal: Positive for abdominal pain.  Genitourinary: Positive for dysuria, flank pain, frequency and urgency.  Skin: Negative.      Physical Exam Triage Vital Signs ED Triage Vitals  Enc Vitals Group     BP 04/11/20 1433 136/89     Pulse Rate 04/11/20 1433 90     Resp 04/11/20 1433 19     Temp 04/11/20 1433 99.3 F (37.4 C)     Temp Source 04/11/20 1433 Oral     SpO2 04/11/20 1433 100 %     Weight 04/11/20 1432 178 lb 9.2 oz (81 kg)     Height 04/11/20 1432 5\' 6"  (1.676 m)     Head Circumference --      Peak Flow --      Pain Score 04/11/20 1431 5      Pain Loc --      Pain Edu? --      Excl. in GC? --    No data found.  Updated Vital Signs BP 136/89 (BP Location: Left Arm)   Pulse 90   Temp 99.3 F (37.4 C) (Oral)   Resp 19   Ht 5\' 6"  (1.676 m)   Wt 81 kg   SpO2 100%   BMI 28.82 kg/m   Visual Acuity Right Eye Distance:   Left Eye Distance:   Bilateral Distance:    Right Eye Near:   Left Eye Near:    Bilateral Near:     Physical Exam Vitals and nursing note reviewed.  Constitutional:      General: She is not in acute distress.    Appearance: She is ill-appearing.  Cardiovascular:     Rate and Rhythm: Normal rate and regular rhythm.  Pulmonary:     Breath sounds: Examination of the left-lower field reveals decreased breath sounds. Decreased breath sounds present. No wheezing, rhonchi or rales.  Chest:     Chest wall: No deformity, tenderness or crepitus.  Abdominal:     General: Bowel sounds are normal.     Palpations: Abdomen is soft.  Musculoskeletal:        General: Normal range of motion.  Skin:    General: Skin is warm.  Neurological:     Mental Status: She is alert.      UC Treatments / Results  Labs (all labs ordered are listed, but only abnormal results are displayed) Labs Reviewed - No data to display  EKG   Radiology DG Chest 2 View  Result Date: 04/11/2020 CLINICAL DATA:  Shortness of breath and orthopnea EXAM: CHEST - 2 VIEW COMPARISON:  03/06/2020 FINDINGS: The heart size and mediastinal contours are within normal limits. Both lungs are clear. Scoliosis of the spine. Right lower lung peripheral lucency suspected soft tissue and or skin artifact. IMPRESSION: 1. No acute airspace disease. 2. Right lower lung peripheral lucency suspected to represent soft tissue or skin artifact. Electronically Signed   By: 06/12/2020 M.D.   On: 04/11/2020 16:38    Procedures Procedures (  including critical care time)  Medications Ordered in UC Medications - No data to display  Initial Impression /  Assessment and Plan / UC Course  I have reviewed the triage vital signs and the nursing notes.  Pertinent labs & imaging results that were available during my care of the patient were reviewed by me and considered in my medical decision making (see chart for details).     1.  Shortness of breath: Chest x-ray is negative for acute lung infiltrate or pulmonary vascular congestion. Patient is reassured that sometimes IV fluids given in the hospital will result in mild fluid overload resulting in some shortness of breath and orthopnea.  She is tolerating her antibiotics. She is encouraged to increase her physical activity Return precautions given Albuterol inhaler as needed for shortness of breath, wheezing. Final Clinical Impressions(s) / UC Diagnoses   Final diagnoses:  Shortness of breath   Discharge Instructions   None    ED Prescriptions    Medication Sig Dispense Auth. Provider   albuterol (VENTOLIN HFA) 108 (90 Base) MCG/ACT inhaler Inhale 2 puffs into the lungs every 4 (four) hours as needed for wheezing or shortness of breath. 18 g Monette Omara, Britta Mccreedy, MD     PDMP not reviewed this encounter.   Merrilee Jansky, MD 04/12/20 [phone]

## 2020-07-25 ENCOUNTER — Ambulatory Visit
Admission: EM | Admit: 2020-07-25 | Discharge: 2020-07-25 | Disposition: A | Discharge status: Home / Self Care | Payer: BC Managed Care – PPO | Attending: Physician Assistant | Admitting: Physician Assistant

## 2020-07-25 DIAGNOSIS — R350 Frequency of micturition: Secondary | ICD-10-CM | POA: Insufficient documentation

## 2020-07-25 DIAGNOSIS — N12 Tubulo-interstitial nephritis, not specified as acute or chronic: Secondary | ICD-10-CM | POA: Insufficient documentation

## 2020-07-25 DIAGNOSIS — R109 Unspecified abdominal pain: Secondary | ICD-10-CM | POA: Insufficient documentation

## 2020-07-25 LAB — URINALYSIS, COMPLETE (UACMP) WITH MICROSCOPIC
Bilirubin Urine: NEGATIVE
Glucose, UA: NEGATIVE mg/dL
Ketones, ur: NEGATIVE mg/dL
Nitrite: POSITIVE — AB
Protein, ur: 30 mg/dL — AB
Specific Gravity, Urine: 1.02 (ref 1.005–1.030)
WBC, UA: >50 WBC/hpf (ref 0–5)
pH: 7 (ref 5.0–8.0)

## 2020-07-25 LAB — PREGNANCY, URINE: Preg Test, Ur: NEGATIVE

## 2020-07-25 MED ORDER — CIPROFLOXACIN HCL 500 MG PO TABS
500.0000 mg | ORAL_TABLET | Freq: Two times a day (BID) | ORAL | 0 refills | Status: AC
Start: 2020-07-25 — End: 2020-08-01

## 2020-07-25 MED ORDER — FLUCONAZOLE 150 MG PO TABS
ORAL_TABLET | ORAL | 0 refills | Status: DC
Start: 2020-07-25 — End: 2021-03-26

## 2020-07-25 NOTE — ED Provider Notes (Signed)
[facility]    CSN: [id number] Arrival date & time: 07/25/20  1256      History   Chief Complaint Chief Complaint  Patient presents with  . Flank Pain    HPI Kylie Martin is a 29 y.o. female presenting for 4-day history of increased urinary frequency and urgency along with cloudy and malodorous urine. Patient admits to onset of right flank pain yesterday and states that her left flank started hurting today. She is currently on her menstrual cycle. Patient denies any associated fever, chills, sweats, nausea, vomiting, diarrhea, abdominal pain. She admits to history of UTIs and has had pyelonephritis and sepsis before. Patient concerned for worsening urinary tract infection. States she took ibuprofen last night but no other medications for symptoms. She denies vaginal discharge or concern for STIs. No other concerns today.  HPI  Past Medical History:  Diagnosis Date  . Anemia   . HSV-2 (herpes simplex virus 2) infection   . Kidney stone   . Sepsis (HCC)   . Trichomonas   . UTI (urinary tract infection)    seeing urology  . Vaccine for human papilloma virus (HPV) types 6, 11, 16, and 18 administered     Patient Active Problem List   Diagnosis Date Noted  . E. coli sepsis (HCC) 12/06/2017  . Pyelonephritis, acute 12/06/2017  . 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 Sepsis (HCC) 11/21/2017    Past Surgical History:  Procedure Laterality Date  . WISDOM TOOTH EXTRACTION      OB History    Gravida  3   Para  1   Term  1   Preterm      AB  2   Living  1     SAB  2   TAB      Ectopic      Multiple      Live Births  1            Home Medications    Prior to Admission medications   Medication Sig Start Date End Date Taking? Authorizing Provider  Norgestimate-Ethinyl Estradiol Triphasic 0.18/0.215/0.25 MG-25 MCG tab Take by mouth. 01/18/20 01/17/21 Yes [provider]  albuterol (VENTOLIN HFA) 108 (90 Base) MCG/ACT inhaler Inhale 2 puffs  into the lungs every 4 (four) hours as needed for wheezing or shortness of breath. 04/11/20   Lamptey, Britta Mccreedy, MD  ciprofloxacin (CIPRO) 500 MG tablet Take 1 tablet (500 mg total) by mouth every 12 (twelve) hours for 7 days. 07/25/20 08/01/20  Shirlee Latch, PA-C  fluconazole (DIFLUCAN) 150 MG tablet Take 1 tablet every 72 hours as needed for yeast infection 07/25/20   Shirlee Latch, PA-C    Family History Family History  Problem Relation Age of Onset  . Hypertension Father   . Diabetes Father   . COPD Sister        born with RSV has immune problems with lung infections  . Skin cancer Mother   . Stroke Maternal Uncle   . Hypertension Paternal Aunt   . Hypertension Maternal Grandfather   . Cancer Maternal Grandfather        lung  . Cancer Paternal Grandfather        lung  . Hypertension Paternal Aunt   . Cancer Maternal Grandmother        lung cancer  . Stroke Paternal Grandmother   . Breast cancer Neg Hx     Social History Social History   Tobacco Use  .  Smoking status: Former Smoker    Types: E-cigarettes, Cigarettes    Quit date: 03/23/2010    Years since quitting: 10.3  . Smokeless tobacco: Never Used  Vaping Use  . Vaping Use: Former  Substance Use Topics  . Alcohol use: Yes    Comment: socially  . Drug use: No     Allergies   Penicillins   Review of Systems Review of Systems  Constitutional: Negative for chills, fatigue and fever.  Gastrointestinal: Negative for abdominal pain, diarrhea, nausea and vomiting.  Genitourinary: Positive for difficulty urinating, dysuria, frequency and urgency. Negative for decreased urine volume, flank pain, hematuria, pelvic pain, vaginal bleeding, vaginal discharge and vaginal pain.  Musculoskeletal: Positive for back pain.  Skin: Negative for rash.  Neurological: Negative for dizziness and weakness.     Physical Exam Triage Vital Signs ED Triage Vitals  Enc Vitals Group     BP 07/25/20 1309 127/68     Pulse Rate  07/25/20 1309 81     Resp 07/25/20 1309 17     Temp 07/25/20 1309 98.6 F (37 C)     Temp Source 07/25/20 1309 Oral     SpO2 07/25/20 1309 100 %     Weight --      Height --      Head Circumference --      Peak Flow --      Pain Score 07/25/20 1305 4     Pain Loc --      Pain Edu? --      Excl. in GC? --    No data found.  Updated Vital Signs BP 127/68 (BP Location: Left Arm)   Pulse 81   Temp 98.6 F (37 C) (Oral)   Resp 17   LMP 07/21/2020   SpO2 100%       Physical Exam Vitals and nursing note reviewed.  Constitutional:      General: She is not in acute distress.    Appearance: Normal appearance. She is not ill-appearing or toxic-appearing.  HENT:     Head: Normocephalic and atraumatic.  Eyes:     General: No scleral icterus.       Right eye: No discharge.        Left eye: No discharge.     Conjunctiva/sclera: Conjunctivae normal.  Cardiovascular:     Rate and Rhythm: Normal rate and regular rhythm.     Heart sounds: Normal heart sounds.  Pulmonary:     Effort: Pulmonary effort is normal. No respiratory distress.     Breath sounds: Normal breath sounds.  Abdominal:     Palpations: Abdomen is soft.     Tenderness: There is abdominal tenderness (mild suprapubic TTP). There is right CVA tenderness. There is no left CVA tenderness.  Musculoskeletal:     Cervical back: Neck supple.  Skin:    General: Skin is dry.  Neurological:     General: No focal deficit present.     Mental Status: She is alert. Mental status is at baseline.     Motor: No weakness.     Gait: Gait normal.  Psychiatric:        Mood and Affect: Mood normal.        Behavior: Behavior normal.        Thought Content: Thought content normal.      UC Treatments / Results  Labs (all labs ordered are listed, but only abnormal results are displayed) Labs Reviewed  URINE CULTURE - Abnormal; Notable for  the following components:      Result Value   Culture   (*)    Value: >=100,000  COLONIES/mL ESCHERICHIA COLI SUSCEPTIBILITIES TO FOLLOW Performed at [facility], [address] [place], [place] [address]    All other components within normal limits  URINALYSIS, COMPLETE (UACMP) WITH MICROSCOPIC - Abnormal; Notable for the following components:   APPearance HAZY (*)    Hgb urine dipstick TRACE (*)    Protein, ur 30 (*)    Nitrite POSITIVE (*)    Leukocytes,Ua LARGE (*)    Bacteria, UA MANY (*)    All other components within normal limits  PREGNANCY, URINE    EKG   Radiology No results found.  Procedures Procedures (including critical care time)  Medications Ordered in UC Medications - No data to display  Initial Impression / Assessment and Plan / UC Course  I have reviewed the triage vital signs and the nursing notes.  Pertinent labs & imaging results that were available during my care of the patient were reviewed by me and considered in my medical decision making (see chart for details).  Clinical Course as of Jul 27 1705  Sat Jul 27, 2020  1704 Urine culture(!) [LE]  1704 Culture(!): >=100,000 COLONIES/mL ESCHERICHIA COLI SUSCEPTIBILITIES TO FOLLOW Performed at [facility], [address] [place], [place] [address]  [LE]    Clinical Course User Index [LE] Shirlee Latch, [place]   Patient has UA with trace hgb, positive nitrites and large leukocytes. Urine sent for culture. All VSS and patient not ill appearing. She does have mild-moderate right CVA tenderness and suprapubic tenderness. Suspect early pyelonephritis. Treating with Ciprofloxacin. Patient also requesting diflucan since she is prone to yeast infections with antibiotic use. Strict ED precautions discussed for any new/worsnening symptoms or concerns of advancing infection.    Final Clinical Impressions(s) / UC Diagnoses   Final diagnoses:  Pyelonephritis  Right flank pain  Urinary frequency     Discharge Instructions     Based on the urinalysis be performed  today you definitely have a urinary tract infection.  Since you do have some tenderness on the right flank on your exam and a long history of pyelonephritis and severe urinary tract infection, I suspect that you may have early pyelonephritis at this time.  Begin antibiotics and take full course as prescribed.  Take the Diflucan if you develop yeast infection.  Increase your fluid intake and rest.  May take Tylenol as needed for pain.  Urine will be sent for culture and you will be contacted if there are is a change needed in your therapy.  If you are not started feel better over the next 2 to 3 days please give [place] a call.  If you develop a fever, chills, sweats, vomiting, increased pain call EMS or go to ED.    ED Prescriptions    Medication Sig Dispense Auth. Provider   ciprofloxacin (CIPRO) 500 MG tablet Take 1 tablet (500 mg total) by mouth every 12 (twelve) hours for 7 days. 14 tablet Eusebio Friendly B, PA-C   fluconazole (DIFLUCAN) 150 MG tablet Take 1 tablet every 72 hours as needed for yeast infection 3 tablet Shirlee Latch, PA-C     PDMP not reviewed this encounter.   Shirlee Latch, PA-C 07/27/20 1709

## 2020-07-25 NOTE — ED Triage Notes (Signed)
Pt c/o urinary frequency, urgency for approx 4 days with cloudy, malodorus urine. States right flank pain onset yesterday, and left flank pain onset today. Currently on menses.  Denies fever, n/v/d.  States h/o frequent UTI.  Took ibuprofen last night. No other OTC meds taken for symptoms.

## 2020-07-25 NOTE — Discharge Instructions (Signed)
Based on the urinalysis be performed today you definitely have a urinary tract infection.  Since you do have some tenderness on the right flank on your exam and a long history of pyelonephritis and severe urinary tract infection, I suspect that you may have early pyelonephritis at this time.  Begin antibiotics and take full course as prescribed.  Take the Diflucan if you develop yeast infection.  Increase your fluid intake and rest.  May take Tylenol as needed for pain.  Urine will be sent for culture and you will be contacted if there are is a change needed in your therapy.  If you are not started feel better over the next 2 to 3 days please give [place] a call.  If you develop a fever, chills, sweats, vomiting, increased pain call EMS or go to ED.

## 2020-07-28 LAB — URINE CULTURE: Culture: >=100000 — AB

## 2021-03-26 ENCOUNTER — Other Ambulatory Visit (HOSPITAL_COMMUNITY)
Admission: RE | Admit: 2021-03-26 | Discharge: 2021-03-26 | Disposition: A | Discharge status: Home / Self Care | Payer: BC Managed Care – PPO | Source: Ambulatory Visit | Attending: Advanced Practice Midwife | Admitting: Advanced Practice Midwife

## 2021-03-26 ENCOUNTER — Encounter: Payer: Self-pay | Admitting: Advanced Practice Midwife

## 2021-03-26 ENCOUNTER — Ambulatory Visit (INDEPENDENT_AMBULATORY_CARE_PROVIDER_SITE_OTHER): Payer: BC Managed Care – PPO | Admitting: Advanced Practice Midwife

## 2021-03-26 ENCOUNTER — Telehealth: Payer: Self-pay

## 2021-03-26 ENCOUNTER — Other Ambulatory Visit: Payer: Self-pay

## 2021-03-26 VITALS — BP 110/80 | Ht 66.0 in | Wt 217.0 lb

## 2021-03-26 DIAGNOSIS — Z8742 Personal history of other diseases of the female genital tract: Secondary | ICD-10-CM

## 2021-03-26 DIAGNOSIS — R638 Other symptoms and signs concerning food and fluid intake: Secondary | ICD-10-CM

## 2021-03-26 DIAGNOSIS — Z113 Encounter for screening for infections with a predominantly sexual mode of transmission: Secondary | ICD-10-CM

## 2021-03-26 DIAGNOSIS — Z01419 Encounter for gynecological examination (general) (routine) without abnormal findings: Secondary | ICD-10-CM | POA: Diagnosis present

## 2021-03-26 DIAGNOSIS — Z124 Encounter for screening for malignant neoplasm of cervix: Secondary | ICD-10-CM

## 2021-03-26 NOTE — Telephone Encounter (Signed)
Pt calling; forgot to ask at appt today about getting on another bc; has been on nuvaring before and liked it or if there is a pill you think would be good for her that would be fine too.   Definitely does not want a pregnancy at this time.  [phone]

## 2021-03-26 NOTE — Patient Instructions (Signed)
Managing Stress, Adult Feeling a certain amount of stress is normal. Stress helps our body and mind get ready to deal with the demands of life. Stress hormones can motivate you to do well at work and meet your responsibilities. However severe or long-lasting (chronic) stress can affect your mental and physical health. Chronic stress puts you at higher risk for anxiety, depression, and other health problems like digestiveproblems, muscle aches, heart disease, high blood pressure, and stroke. What are the causes? Common causes of stress include: Demands from work, such as deadlines, feeling overworked, or having long hours. Pressures at home, such as money issues, disagreements with a spouse, or parenting issues. Pressures from major life changes, such as divorce, moving, loss of a loved one, or chronic illness. You may be at higher risk for stress-related problems if you do not get enough sleep, are in poor health, do not have emotional support, or have a mentalhealth disorder like anxiety or depression. How to recognize stress Stress can make you: Have trouble sleeping. Feel sad, anxious, irritable, or overwhelmed. Lose your appetite. Overeat or want to eat unhealthy foods. Want to use drugs or alcohol. Stress can also cause physical symptoms, such as: Sore, tense muscles, especially in the shoulders and neck. Headaches. Trouble breathing. A faster heart rate. Stomach pain, nausea, or vomiting. Diarrhea or constipation. Trouble concentrating. Follow these instructions at home: Lifestyle Identify the source of your stress and your reaction to it. See a therapist who can help you change your reactions. When there are stressful events: Talk about it with family, friends, or co-workers. Try to think realistically about stressful events and not ignore them or overreact. Try to find the positives in a stressful situation and not focus on the negatives. Cut back on responsibilities at work and  home, if possible. Ask for help from friends or family members if you need it. Find ways to cope with stress, such as: Meditation. Deep breathing. Yoga or tai chi. Progressive muscle relaxation. Doing art, playing music, or reading. Making time for fun activities. Spending time with family and friends. Get support from family, friends, or spiritual resources. Eating and drinking Eat a healthy diet. This includes: Eating foods that are high in fiber, such as beans, whole grains, and fresh fruits and vegetables. Limiting foods that are high in fat and processed sugars, such as fried and sweet foods. Do not skip meals or overeat. Drink enough fluid to keep your urine pale yellow. Alcohol use Do not drink alcohol if: Your health care provider tells you not to drink. You are pregnant, may be pregnant, or are planning to become pregnant. Drinking alcohol is a way some people try to ease their stress. This can be dangerous, so if you drink alcohol: Limit how much you use to: 0-1 drink a day for women. 0-2 drinks a day for men. Be aware of how much alcohol is in your drink. In the [place] one drink equals one 12 oz bottle of beer (355 mL), one 5 oz glass of wine (148 mL), or one 1 oz glass of hard liquor (44 mL). Activity  Include 30 minutes of exercise in your daily schedule. Exercise is a good stress reducer. Include time in your day for an activity that you find relaxing. Try taking a walk, going on a bike ride, reading a book, or listening to music. Schedule your time in a way that lowers stress, and keep a consistent schedule. Prioritize what is most important to get done.  General  instructions Get enough sleep. Try to go to sleep and get up at about the same time every day. Take over-the-counter and prescription medicines only as told by your health care provider. Do not use any products that contain nicotine or tobacco, such as cigarettes, e-cigarettes, and chewing tobacco. If you  need help quitting, ask your health care provider. Do not use drugs or smoke to cope with stress. Keep all follow-up visits as told by your health care provider. This is important. Where to find support Talk with your health care provider about stress management or finding a support group. Find a therapist to work with you on your stress management techniques. Contact a health care provider if: Your stress symptoms get worse. You are unable to manage your stress at home. You are struggling to stop using drugs or alcohol. Get help right away if: You may be a danger to yourself or others. You have any thoughts of death or suicide. If you ever feel like you may hurt yourself or others, or have thoughts about taking your own life, get help right away. You can go to your nearest emergency department or call: Your local emergency services (911 in the [place] A suicide crisis helpline, such as the [facility] at 623-066-5793. This is open 24 hours a day. Summary Feeling a certain amount of stress is normal, but severe or long-lasting (chronic) stress can affect your mental and physical health. Chronic stress can put you at higher risk for anxiety, depression, and other health problems like digestive problems, muscle aches, heart disease, high blood pressure, and stroke. You may be at higher risk for stress-related problems if you do not get enough sleep, are in poor health, lack emotional support, or have a mental health disorder like anxiety or depression. Identify the source of your stress and your reaction to it. Try talking about stressful events with family, friends, or co-workers, finding a coping method, or getting support from spiritual resources. If you need more help, talk with your health care provider about finding a support group or a mental health therapist. This information is not intended to replace advice given to you by your health care provider. Make sure  you discuss any questions you have with your healthcare provider. Document Revised: 04/19/2019 Document Reviewed: 04/19/2019 Elsevier Patient Education  [place] for Polycystic Ovary Syndrome Polycystic ovary syndrome (PCOS) is a common hormonal disorder that affects a woman's reproductive system. It can cause problems with menstrual periods and make it hard to get and stay pregnant. Changing what you eat can help your hormones reach normal levels, improve your health, and help you better managePCOS. Following a balanced diet can help you lose weight and improve the way that your body uses the hormone insulin to control blood sugar. This may include: Eating low-fat (lean) proteins, complex carbohydrates, fresh fruits and vegetables, low-fat dairy products, healthy fats, and fiber. Cutting down on calories. Exercising regularly. What are tips for following this plan? Follow a balanced diet for meals and snacks. Eat breakfast, lunch, dinner, and one or two snacks every day. Include protein in each meal and snack. Choose whole grains instead of products that are made with refined flour. Eat a variety of foods. Exercise regularly as told by your health care provider. Aim to do at least 30 minutes of exercise on most days of the week. If you are overweight or obese: Pay attention to how many calories you eat. Cutting down on calories can  help you lose weight. Work with your health care provider or a dietitian to figure out how many calories you need each day. What foods should I eat?  Fruits Include a variety of colors and types. All fruits are helpful for PCOS. Vegetables Include a variety of colors and types. All vegetables are helpful for PCOS. Grains Whole grains, such as whole wheat. Whole-grain breads, crackers, cereals, and pasta. Unsweetened oatmeal. Bulgur, barley, quinoa, and brown rice. Tortillasmade from corn or whole-wheat flour. Meats and other proteins Lean  proteins, such as fish, chicken, beans, eggs, and tofu. Dairy Low-fat dairy products, such as skim milk, cheese sticks, and yogurt. Beverages Low-fat or fat-free drinks, such as water, low-fat milk, sugar-free drinks, andsmall amounts of 100% fruit juice. Seasonings and condiments Ketchup. Mustard. Barbecue sauce. Relish. Low-fat or fat-free mayonnaise. Fats and oils Olive oil or canola oil. Walnuts and almonds. The items listed above may not be a complete list of recommended foods and beverages. Contact a dietitian for more options. What foods should I avoid? Foods that are high in calories or fat, especially saturated or trans fats. Fried foods. Sweets. Products that are made from refined white flour, includingwhite bread, pastries, white rice, and pasta. The items listed above may not be a complete list of foods and beverages to avoid. Contact a dietitian for more information. Summary PCOS is a hormonal imbalance that affects a woman's reproductive system. It can cause problems with menstrual periods and make it hard to get and stay pregnant. You can help to manage your PCOS by exercising regularly and eating a healthy, varied diet of vegetables, fruit, whole grains, lean protein, and low-fat dairy products. Changing what you eat can improve the way that your body uses insulin, help your hormones reach normal levels, and help you lose weight. This information is not intended to replace advice given to you by your health care provider. Make sure you discuss any questions you have with your healthcare provider. Document Revised: 02/29/2020 Document Reviewed: 02/29/2020 Elsevier Patient Education  2022 [place]. Polycystic Ovary Syndrome  Polycystic ovarian syndrome (PCOS) is a common hormonal disorder among women of reproductive age. In most women with PCOS, small fluid-filled sacs (cysts) grow on the ovaries. PCOS can cause problems with menstrual periods and make it hard to get and stay  pregnant. If this condition is not treated, it can leadto serious health problems, such as diabetes and heart disease. What are the causes? The cause of this condition is not known. It may be due to certain factors, such as: Irregular menstrual cycle. High levels of certain hormones. Problems with the hormone that helps to control blood sugar (insulin). Certain genes. What increases the risk? You are more likely to develop this condition if you: Have a family history of PCOS or type 2 diabetes. Are overweight, eat unhealthy foods, and are not active. These factors may cause problems with blood sugar control, which can contribute to PCOS or PCOS symptoms. What are the signs or symptoms? Symptoms of this condition include: Ovarian cysts and sometimes pelvic pain. Menstrual periods that are not regular or are too heavy. Inability to get or stay pregnant. Increased growth of hair on the face, chest, stomach, back, thumbs, thighs, or toes. Acne or oily skin. Acne may develop during adulthood, and it may not get better with treatment. Weight gain or obesity. Patches of thickened and dark brown or black skin on the neck, arms, breasts, or thighs. How is this diagnosed? This condition is  diagnosed based on: Your medical history. A physical exam that includes a pelvic exam. Your health care provider may look for areas of increased hair growth on your skin. Tests, such as: An ultrasound to check the ovaries for cysts and to view the lining of the uterus. Blood tests to check levels of sugar (glucose), female hormone (testosterone), and female hormones (estrogen and progesterone). How is this treated? There is no cure for this condition, but treatment can help to manage symptoms and prevent more health problems from developing. Treatment varies depending onyour symptoms and if you want to have a baby or if you need birth control. Treatment may include: Making nutrition and lifestyle changes. Taking  the progesterone hormone to start a menstrual period. Taking birth control pills to help you have regular menstrual periods. Taking medicines such as: Medicines to make you ovulate, if you want to get pregnant. Medicine to reduce extra hair growth. Having surgery in severe cases. This may involve making small holes in one or both of your ovaries. This decreases the amount of testosterone that your body makes. Follow these instructions at home: Take over-the-counter and prescription medicines only as told by your health care provider. Follow a healthy meal plan that includes lean proteins, complex carbohydrates, fresh fruits and vegetables, low-fat dairy products, healthy fats, and fiber. If you are overweight, lose weight as told by your health care provider. Your health care provider can determine how much weight loss is best for you and can help you lose weight safely. Keep all follow-up visits. This is important. Contact a health care provider if: Your symptoms do not get better with medicine. Your symptoms get worse or you develop new symptoms. Summary Polycystic ovarian syndrome (PCOS) is a common hormonal disorder among women of reproductive age. PCOS can cause problems with menstrual periods and make it hard to get and stay pregnant. If this condition is not treated, it can lead to serious health problems, such as diabetes and heart disease. There is no cure for this condition, but treatment can help to manage symptoms and prevent more health problems from developing. This information is not intended to replace advice given to you by your health care provider. Make sure you discuss any questions you have with your healthcare provider. Document Revised: 02/29/2020 Document Reviewed: 02/29/2020 Elsevier Patient Education  2022 [employer].

## 2021-03-27 LAB — TESTOSTERONE: Testosterone: 70 ng/dL (ref 13–71)

## 2021-03-27 LAB — PROLACTIN: Prolactin: 15.6 ng/mL (ref 4.8–23.3)

## 2021-03-27 LAB — FSH/LH
FSH: 7.1 m[IU]/mL
LH: 34.7 m[IU]/mL

## 2021-03-27 LAB — HGB A1C W/O EAG: Hgb A1c MFr Bld: 5.4 % (ref 4.8–5.6)

## 2021-03-27 LAB — DHEA-SULFATE: DHEA-SO4: 209 ug/dL (ref 84.8–378.0)

## 2021-03-27 NOTE — Progress Notes (Signed)
Gynecology Annual Exam   PCP: Horner, Olena Mater, NP  Chief Complaint:  Chief Complaint  Patient presents with   Gynecologic Exam    Questions on PCOS    History of Present Illness: Patient is a 30 y.o. [id number] presents for annual exam. The patient has complaints today of increased stress, flashbacks of traumatic events, anxiety depression, and inability to lose weight "no matter what" she does. She is getting married next year and has a desire to lose weight prior. She admits to Keto like diet and adequate vigorous exercise. She had labs done at PCP recently including CBC, CMP, TSH, vitamin D. Glucose and TSH are wnl. Vitamin D is low and she is now supplementing. She stopped taking hormonal birth control a few months ago since she thought it was causing weight gain.   She is concerned she may have PCOS based on what she was told by provider. They initiated metformin. She is not taking it due to side effect of nausea. We discussed several things including the importance of healthy lifestyle, body type/healthy weight, benefits of therapy for mood disturbances, and diagnostic criteria and management of PCOS. We will do additional labs today and consider imaging at a later time. She does not have a history of infertility or irregular cycles.   LMP: Patient's last menstrual period was 03/03/2021 (approximate). Average Interval: regular, 30 days Duration of flow: 5 days Heavy Menses: 2 heavy days Clots: no Intermenstrual Bleeding: no Postcoital Bleeding: no Dysmenorrhea: no  The patient is sexually active. She currently uses condoms for contraception. She denies dyspareunia.  The patient does perform self breast exams.  There is no notable family history of breast or ovarian cancer in her family.  The patient wears seatbelts: yes.   The patient has regular exercise: yes.    The patient reports current symptoms of depression.  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 She then took Wellbutrin for a while. She declines need for medication at this time.  Review of Systems: Review of Systems  Constitutional:  Positive for malaise/fatigue. Negative for chills and fever.       Positive for inability to lose weight  HENT:  Negative for congestion, ear discharge, ear pain, hearing loss, sinus pain and sore throat.   Eyes:  Negative for blurred vision and double vision.  Respiratory:  Negative for cough, shortness of breath and wheezing.   Cardiovascular:  Negative for chest pain, palpitations and leg swelling.  Gastrointestinal:  Positive for nausea. Negative for abdominal pain, blood in stool, constipation, diarrhea, heartburn, melena and vomiting.  Genitourinary:  Negative for dysuria, flank pain, frequency, hematuria and urgency.  Musculoskeletal:  Negative for back pain, joint pain and myalgias.  Skin:  Negative for itching and rash.  Neurological:  Negative for dizziness, tingling, tremors, sensory change, speech change, focal weakness, seizures, loss of consciousness, weakness and headaches.  Endo/Heme/Allergies:  Negative for environmental allergies. Does not bruise/bleed easily.  Psychiatric/Behavioral:  Negative for depression, hallucinations, memory loss, substance abuse and suicidal ideas. The patient is not nervous/anxious and does not have insomnia.        Positive for anxiety, stress, depression   Past Medical History:  Patient Active Problem List   Diagnosis Date Noted   History of kidney stones 02/07/2019   History of pyelonephritis 02/07/2019   Recurrent UTI 12/06/2017    Past Surgical History:  Past Surgical History:  Procedure Laterality Date   WISDOM TOOTH  EXTRACTION      Gynecologic History:  Patient's last menstrual period was 03/03/2021 (approximate). Contraception: condoms Last Pap: Results were: ASCUS with POSITIVE high risk HPV   Obstetric History: [id number]  Family History:  Family History   Problem Relation Age of Onset   Skin cancer Mother    Hypertension Father    Diabetes Father    COPD Sister        born with RSV has immune problems with lung infections   Stroke Maternal Uncle    Hypertension Paternal Aunt    Hypertension Paternal Aunt    Brain cancer Paternal Aunt    Cancer Maternal Grandmother        lung cancer   Hypertension Maternal Grandfather    Cancer Maternal Grandfather        lung   Stroke Paternal Grandmother    Cancer Paternal Grandfather        lung   Breast cancer Neg Hx     Social History:  Social History   Socioeconomic History   Marital status: Single    Spouse name: Not on file   Number of children: 1   Years of education: 12   Highest education level: Not on file  Occupational History   Occupation: [employer]  Tobacco Use   Smoking status: Former    Pack years: 0.00    Types: E-cigarettes, Cigarettes    Quit date: 03/23/2010    Years since quitting: 11.0   Smokeless tobacco: Never  Vaping Use   Vaping Use: Former  Substance and Sexual Activity   Alcohol use: Yes    Comment: socially   Drug use: No   Sexual activity: Yes    Birth control/protection: Condom  Other Topics Concern   Not on file  Social History Narrative   Not on file   Social Determinants of Health   Financial Resource Strain: Not on file  Food Insecurity: Not on file  Transportation Needs: Not on file  Physical Activity: Not on file  Stress: Not on file  Social Connections: Not on file  Intimate Partner Violence: Not on file    Allergies:  Allergies  Allergen Reactions   Penicillins Rash    "only with penicillin injection"    Medications: Prior to Admission medications   Medication Sig Start Date End Date Taking? Authorizing Provider  metFORMIN (GLUCOPHAGE) 500 MG tablet Take 1 tablet by mouth 2 (two) times daily. 03/13/21  Yes [provider]  Vitamin D, Ergocalciferol, (DRISDOL) 1.25 MG (50000 UNIT) CAPS capsule Take 1 capsule  by mouth once a week. 03/14/21  Yes [provider]    Physical Exam Vitals: Blood pressure 110/80, height 5\' 6"  (1.676 m), weight 217 lb (98.4 kg), last menstrual period 03/03/2021.  General: NAD HEENT: normocephalic, anicteric Thyroid: no enlargement, no palpable nodules Pulmonary: No increased work of breathing, CTAB Cardiovascular: RRR, distal pulses 2+ Breast: Breast symmetrical, no tenderness, no palpable nodules or masses, no skin or nipple retraction present, no nipple discharge.  No axillary or supraclavicular lymphadenopathy. Abdomen: NABS, soft, non-tender, non-distended.  Umbilicus without lesions.  No hepatomegaly, splenomegaly or masses palpable. No evidence of hernia  Genitourinary:  External: Normal external female genitalia.  Normal urethral meatus, normal Bartholin's and Skene's glands.    Vagina: Normal vaginal mucosa, no evidence of prolapse.    Cervix: Grossly normal in appearance, no bleeding, no CMT Extremities: no edema, erythema, or tenderness Neurologic: Grossly intact Psychiatric: mood appropriate, affect full   Assessment: 29  y.o. [id number] routine annual exam  Plan: Problem List Items Addressed This Visit   None Visit Diagnoses     Well woman exam with routine gynecological exam    -  Primary   Relevant Orders   Prolactin (Completed)   FSH/LH (Completed)   DHEA-sulfate (Completed)   Testosterone (Completed)   Cytology - PAP   Hgb A1c w/o eAG (Completed)   Screen for sexually transmitted diseases       Relevant Orders   Cytology - PAP   Screening for cervical cancer       Relevant Orders   Cytology - PAP   Unable to lose weight       Relevant Orders   Prolactin (Completed)   FSH/LH (Completed)   DHEA-sulfate (Completed)   Testosterone (Completed)   Hgb A1c w/o eAG (Completed)       1) STI screening  was offered and accepted  2)  ASCCP guidelines and rationale discussed.  Patient opts for every 3 years screening interval  following normal screen  3) Contraception - the patient is currently using  condoms.  She is happy with her current form of contraception and plans to continue  4) Routine healthcare maintenance including cholesterol, diabetes screening discussed:  had recent labs done at PCP  5) Return in about 1 year (around 03/26/2022) for annual established gyn.  6) Follow up as needed after PCOS labs result  7) Healthy lifestyle encouraged  8) Consider therapy for depression/anxiety   Tresea Mall, CNM [facility] 03/27/2021, 9:51 AM

## 2021-03-31 LAB — CYTOLOGY - PAP
Chlamydia: NEGATIVE
Comment: NEGATIVE
Comment: NEGATIVE
Comment: NORMAL
Diagnosis: NEGATIVE
Neisseria Gonorrhea: NEGATIVE
Trichomonas: NEGATIVE

## 2021-04-01 ENCOUNTER — Other Ambulatory Visit: Payer: Self-pay | Admitting: Advanced Practice Midwife

## 2021-04-01 DIAGNOSIS — Z30015 Encounter for initial prescription of vaginal ring hormonal contraceptive: Secondary | ICD-10-CM

## 2021-04-01 MED ORDER — ETONOGESTREL-ETHINYL ESTRADIOL 0.12-0.015 MG/24HR VA RING: VAGINAL_RING | VAGINAL | 3 refills | Status: DC

## 2021-04-01 NOTE — Progress Notes (Signed)
Rx Nuvaring sent to patient's pharmacy. MyChart message sent.

## 2021-07-02 ENCOUNTER — Ambulatory Visit
Admission: EM | Admit: 2021-07-02 | Discharge: 2021-07-02 | Disposition: A | Discharge status: Home / Self Care | Payer: BC Managed Care – PPO | Attending: Emergency Medicine | Admitting: Emergency Medicine

## 2021-07-02 ENCOUNTER — Other Ambulatory Visit: Payer: Self-pay

## 2021-07-02 DIAGNOSIS — N390 Urinary tract infection, site not specified: Secondary | ICD-10-CM | POA: Insufficient documentation

## 2021-07-02 LAB — URINALYSIS, COMPLETE (UACMP) WITH MICROSCOPIC
Bilirubin Urine: NEGATIVE
Glucose, UA: NEGATIVE mg/dL
Hgb urine dipstick: NEGATIVE
Ketones, ur: NEGATIVE mg/dL
Leukocytes,Ua: NEGATIVE
Nitrite: NEGATIVE
Protein, ur: NEGATIVE mg/dL
Specific Gravity, Urine: 1.02 (ref 1.005–1.030)
pH: 8.5 — ABNORMAL HIGH (ref 5.0–8.0)

## 2021-07-02 LAB — PREGNANCY, URINE: Preg Test, Ur: NEGATIVE

## 2021-07-02 MED ORDER — PHENAZOPYRIDINE HCL 200 MG PO TABS
200.0000 mg | ORAL_TABLET | Freq: Three times a day (TID) | ORAL | 0 refills | Status: DC
Start: 2021-07-02 — End: 2022-07-29

## 2021-07-02 MED ORDER — CEPHALEXIN 500 MG PO CAPS: 500.0000 mg | ORAL_CAPSULE | Freq: Two times a day (BID) | ORAL | 0 refills | Status: AC

## 2021-07-02 MED ORDER — FLUCONAZOLE 200 MG PO TABS: 200.0000 mg | ORAL_TABLET | Freq: Every day | ORAL | 1 refills | Status: AC

## 2021-07-02 NOTE — ED Triage Notes (Signed)
Pt c/o severe flank pain on both sides since Monday. Pt also reports urinary pressure, denies pain, odor or other symptoms. Pt denies f/n/v/d or other symptoms.

## 2021-07-02 NOTE — Discharge Instructions (Signed)
Take the Keflex twice daily for 7 days with food for treatment of urinary tract infection.  Use the Pyridium every 8 hours as needed for urinary discomfort.  This will turn your urine a bright red-orange.  Increase your oral fluid intake so that you increase your urine production and or flushing your urinary system.  If you develop symptoms of a yeast infection take 1 Diflucan tablet at outset and repeat in 1 week as needed.   Take an over-the-counter probiotic, such as Culturelle-Align-Activia, 1 hour after each dose of antibiotic to prevent diarrhea or yeast infections from forming.  We will culture urine and change the antibiotics if necessary.  Return for reevaluation, or see your primary care provider, for any new or worsening symptoms.

## 2021-07-02 NOTE — ED Provider Notes (Signed)
[facility]    CSN: [id number] Arrival date & time: 07/02/21  1235      History   Chief Complaint Chief Complaint  Patient presents with   Flank Pain    bilateral    HPI Kylie Martin is a 30 y.o. female.   HPI  30 year old female here for evaluation of bilateral flank pain and urine pressure.  Patient reports that she has been experiencing bilateral flank pain that will come and go in waves and urinary pressure for the last 2 days.  She states she is also had this constant sort of hunger pain in her stomach but she does not have any pain in her abdomen.  She denies nausea or vomiting, blood in her urine, cloudiness to her urine, urinary urgency, frequency, or pain with urination.  She did report a fever of 101.1 yesterday and she has been taking Tylenol and ibuprofen around-the-clock so she has not registered a another fever.  She is also had an increase in diarrhea stools.  Past Medical History:  Diagnosis Date   Anemia    HSV-2 (herpes simplex virus 2) infection    Kidney stone    Sepsis (HCC)    Trichomonas    UTI (urinary tract infection)    seeing urology   Vaccine for human papilloma virus (HPV) types 6, 11, 16, and 18 administered     Patient Active Problem List   Diagnosis Date Noted   History of kidney stones 02/07/2019   History of pyelonephritis 02/07/2019   Recurrent UTI 12/06/2017    Past Surgical History:  Procedure Laterality Date   WISDOM TOOTH EXTRACTION      OB History     Gravida  3   Para  1   Term  1   Preterm      AB  2   Living  1      SAB  2   IAB      Ectopic      Multiple      Live Births  1            Home Medications    Prior to Admission medications   Medication Sig Start Date End Date Taking? Authorizing Provider  cephALEXin (KEFLEX) 500 MG capsule Take 1 capsule (500 mg total) by mouth 2 (two) times daily for 7 days. 07/02/21 07/09/21 Yes Becky Augusta, NP  etonogestrel-ethinyl estradiol  (NUVARING) 0.12-0.015 MG/24HR vaginal ring Insert vaginally and leave in place for 3 consecutive weeks, then remove for 1 week. 04/01/21  Yes Tresea Mall, CNM  fluconazole (DIFLUCAN) 200 MG tablet Take 1 tablet (200 mg total) by mouth daily for 2 doses. 07/02/21 07/04/21 Yes Becky Augusta, NP  metFORMIN (GLUCOPHAGE) 500 MG tablet Take 1 tablet by mouth 2 (two) times daily. 03/13/21  Yes [provider]  phenazopyridine (PYRIDIUM) 200 MG tablet Take 1 tablet (200 mg total) by mouth 3 (three) times daily. 07/02/21  Yes Becky Augusta, NP  phentermine (ADIPEX-P) 37.5 MG tablet Take 37.5 mg by mouth daily. 04/29/21  Yes [provider]  Vitamin D, Ergocalciferol, (DRISDOL) 1.25 MG (50000 UNIT) CAPS capsule Take 1 capsule by mouth once a week. 03/14/21  Yes [provider]    Family History Family History  Problem Relation Age of Onset   Skin cancer Mother    Hypertension Father    Diabetes Father    COPD Sister        born with RSV has immune problems  with lung infections   Stroke Maternal Uncle    Hypertension Paternal Aunt    Hypertension Paternal Aunt    Brain cancer Paternal Aunt    Cancer Maternal Grandmother        lung cancer   Hypertension Maternal Grandfather    Cancer Maternal Grandfather        lung   Stroke Paternal Grandmother    Cancer Paternal Grandfather        lung   Breast cancer Neg Hx     Social History Social History   Tobacco Use   Smoking status: Former    Types: E-cigarettes, Cigarettes    Quit date: 03/23/2010    Years since quitting: 11.2   Smokeless tobacco: Never  Vaping Use   Vaping Use: Former  Substance Use Topics   Alcohol use: Yes    Comment: socially   Drug use: No     Allergies   Penicillins   Review of Systems Review of Systems  Constitutional:  Positive for fever. Negative for activity change and appetite change.  Gastrointestinal:  Positive for diarrhea. Negative for abdominal pain, nausea and vomiting.   Genitourinary:  Positive for flank pain. Negative for dysuria, frequency, hematuria and urgency.  Musculoskeletal:  Positive for back pain.  Hematological: Negative.   Psychiatric/Behavioral: Negative.      Physical Exam Triage Vital Signs ED Triage Vitals  Enc Vitals Group     BP 07/02/21 1249 (!) 131/94     Pulse Rate 07/02/21 1249 80     Resp 07/02/21 1249 18     Temp 07/02/21 1249 98.5 F (36.9 C)     Temp Source 07/02/21 1249 Oral     SpO2 07/02/21 1249 100 %     Weight 07/02/21 1246 211 lb (95.7 kg)     Height 07/02/21 1246 5\' 6"  (1.676 m)     Head Circumference --      Peak Flow --      Pain Score 07/02/21 1245 9     Pain Loc --      Pain Edu? --      Excl. in GC? --    No data found.  Updated Vital Signs BP (!) 131/94 (BP Location: Left Arm)   Pulse 80   Temp 98.5 F (36.9 C) (Oral)   Resp 18   Ht 5\' 6"  (1.676 m)   Wt 211 lb (95.7 kg)   LMP 06/01/2021 (Approximate)   SpO2 100%   BMI 34.06 kg/m   Visual Acuity Right Eye Distance:   Left Eye Distance:   Bilateral Distance:    Right Eye Near:   Left Eye Near:    Bilateral Near:     Physical Exam Vitals and nursing note reviewed.  Constitutional:      General: She is not in acute distress.    Appearance: Normal appearance. She is ill-appearing.  HENT:     Head: Normocephalic and atraumatic.  Cardiovascular:     Rate and Rhythm: Normal rate and regular rhythm.     Pulses: Normal pulses.     Heart sounds: Normal heart sounds. No murmur heard.   No gallop.  Pulmonary:     Effort: Pulmonary effort is normal.     Breath sounds: Normal breath sounds. No wheezing, rhonchi or rales.  Abdominal:     General: Abdomen is flat. Bowel sounds are normal.     Palpations: Abdomen is soft.     Tenderness: There is abdominal tenderness. There is  no right CVA tenderness, left CVA tenderness, guarding or rebound.     Comments: She does have suprapubic tenderness.  Skin:    General: Skin is warm and dry.      Capillary Refill: Capillary refill takes less than 2 seconds.     Findings: No erythema or rash.  Neurological:     General: No focal deficit present.     Mental Status: She is alert and oriented to person, place, and time.  Psychiatric:        Mood and Affect: Mood normal.        Behavior: Behavior normal.        Thought Content: Thought content normal.        Judgment: Judgment normal.     UC Treatments / Results  Labs (all labs ordered are listed, but only abnormal results are displayed) Labs Reviewed  URINALYSIS, COMPLETE (UACMP) WITH MICROSCOPIC - Abnormal; Notable for the following components:      Result Value   pH 8.5 (*)    Bacteria, UA FEW (*)    All other components within normal limits  URINE CULTURE  PREGNANCY, URINE    EKG   Radiology No results found.  Procedures Procedures (including critical care time)  Medications Ordered in UC Medications - No data to display  Initial Impression / Assessment and Plan / UC Course  I have reviewed the triage vital signs and the nursing notes.  Pertinent labs & imaging results that were available during my care of the patient were reviewed by me and considered in my medical decision making (see chart for details).  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  She does endorse a fever yesterday with a T-max of 101.1.  She has been taking Tylenol ibuprofen around-the-clock and has not measured another fever since then.  She denies any pain with urination or urinary urgency and frequency just increasing urinary pressure.  She has had some diarrhea stools but no nausea or vomiting.  She denies any blood in her urine or stools.  She is had no abdominal pain but states she has a constant feeling of being hungry.  Patient's physical exam reveals a benign  cardiopulmonary exam with clear lung sounds in all fields.  Patient has no CVA tenderness on exam.  Abdomen is soft, flat, with positive bowel sounds in all 4 quadrants.  Patient does have mild suprapubic tenderness but no other tenderness, guarding, or rebound.  Urinalysis was collected at triage.  Patient states she has having irregular periods her last period was 06/01/2021.  We will also check urine pregnancy.  Urinalysis shows pH of 8.5, 11-20 WBCs, few bacteria, and WBC clumps.  No leukocyte esterase or nitrates.  No blood.  We will send urine for culture.  Pregnancy test is negative.  Patient has symptoms that are concerning for development of pyelonephritis despite the lack of CVA tenderness on exam.  Given her extensive urinary tract infection history and history of pyelonephritis we will treat her with Keflex 500 mg twice daily x7 days.  I will also prescribe Pyridium to help with urinary pressure discomfort that she may be developing.  Patient is requesting Diflucan for treatment of yeast infection should she get 1.  She states that she was previously planned with this infections after antibiotic therapy.  ER return precautions  reviewed with patient.    Final Clinical Impressions(s) / UC Diagnoses   Final diagnoses:  Lower urinary tract infectious disease     Discharge Instructions      Take the Keflex twice daily for 7 days with food for treatment of urinary tract infection.  Use the Pyridium every 8 hours as needed for urinary discomfort.  This will turn your urine a bright red-orange.  Increase your oral fluid intake so that you increase your urine production and or flushing your urinary system.  If you develop symptoms of a yeast infection take 1 Diflucan tablet at outset and repeat in 1 week as needed.   Take an over-the-counter probiotic, such as Culturelle-Align-Activia, 1 hour after each dose of antibiotic to prevent diarrhea or yeast infections from forming.  We will  culture urine and change the antibiotics if necessary.  Return for reevaluation, or see your primary care provider, for any new or worsening symptoms.      ED Prescriptions     Medication Sig Dispense Auth. Provider   cephALEXin (KEFLEX) 500 MG capsule Take 1 capsule (500 mg total) by mouth 2 (two) times daily for 7 days. 14 capsule Becky Augusta, NP   phenazopyridine (PYRIDIUM) 200 MG tablet Take 1 tablet (200 mg total) by mouth 3 (three) times daily. 6 tablet Becky Augusta, NP   fluconazole (DIFLUCAN) 200 MG tablet Take 1 tablet (200 mg total) by mouth daily for 2 doses. 2 tablet Becky Augusta, NP      PDMP not reviewed this encounter.   Becky Augusta, NP 07/02/21 1326

## 2021-07-03 LAB — URINE CULTURE

## 2022-06-02 ENCOUNTER — Telehealth: Payer: Self-pay

## 2022-06-02 NOTE — Telephone Encounter (Signed)
Called after receiving a letter by mail about Reginal Lutes being prescribed. I informed pt we don't prescribe this medicine. She stated it is for "PCOS." I asked if she had an OBGYN and stated we will be glad to send her to one or her insurance may not require a referral and gave her the name of the doctor we normally send to. She stated she may just see [place] for some things and her pcp for others. I also explained she cannot have 2 open primary care physicians. She stated "I will think on it." I let her know that we would hold the appt, but she would need to let us know. Pt. Voiced understanding.

## 2022-07-28 ENCOUNTER — Other Ambulatory Visit: Payer: Self-pay

## 2022-07-28 ENCOUNTER — Encounter: Payer: Self-pay | Admitting: Emergency Medicine

## 2022-07-28 DIAGNOSIS — R300 Dysuria: Secondary | ICD-10-CM | POA: Insufficient documentation

## 2022-07-28 DIAGNOSIS — Z7984 Long term (current) use of oral hypoglycemic drugs: Secondary | ICD-10-CM | POA: Insufficient documentation

## 2022-07-28 DIAGNOSIS — N390 Urinary tract infection, site not specified: Secondary | ICD-10-CM | POA: Diagnosis not present

## 2022-07-28 DIAGNOSIS — D72829 Elevated white blood cell count, unspecified: Secondary | ICD-10-CM | POA: Diagnosis not present

## 2022-07-28 DIAGNOSIS — R1031 Right lower quadrant pain: Secondary | ICD-10-CM | POA: Diagnosis present

## 2022-07-28 LAB — CBC
HCT: 41.6 % (ref 36.0–46.0)
Hemoglobin: 13.7 g/dL (ref 12.0–15.0)
MCH: 29 pg (ref 26.0–34.0)
MCHC: 32.9 g/dL (ref 30.0–36.0)
MCV: 87.9 fL (ref 80.0–100.0)
Platelets: 259 10*3/uL (ref 150–400)
RBC: 4.73 MIL/uL (ref 3.87–5.11)
RDW: 12.3 % (ref 11.5–15.5)
WBC: 20.3 10*3/uL — ABNORMAL HIGH (ref 4.0–10.5)
nRBC: 0 % (ref 0.0–0.2)

## 2022-07-28 NOTE — ED Triage Notes (Signed)
Pt to ED from home c/o sudden lower abd pain started approx 1 hr ago, nausea without vomiting or diarrhea, pressure to urinate.  Hx of frequent UTIs but this is worse and more localized pain.  Pt uncomfortable in triage, chest rise even and unlabored, skin WNL.

## 2022-07-29 ENCOUNTER — Emergency Department: Payer: Managed Care, Other (non HMO)

## 2022-07-29 ENCOUNTER — Emergency Department
Admission: EM | Admit: 2022-07-29 | Discharge: 2022-07-29 | Disposition: A | Discharge status: Home / Self Care | Payer: Managed Care, Other (non HMO) | Attending: Emergency Medicine | Admitting: Emergency Medicine

## 2022-07-29 DIAGNOSIS — R300 Dysuria: Secondary | ICD-10-CM

## 2022-07-29 DIAGNOSIS — R1030 Lower abdominal pain, unspecified: Secondary | ICD-10-CM

## 2022-07-29 DIAGNOSIS — N390 Urinary tract infection, site not specified: Secondary | ICD-10-CM | POA: Diagnosis not present

## 2022-07-29 HISTORY — DX: Polycystic ovarian syndrome: E28.2

## 2022-07-29 LAB — URINALYSIS, ROUTINE W REFLEX MICROSCOPIC
Bilirubin Urine: NEGATIVE
Glucose, UA: NEGATIVE mg/dL
Ketones, ur: NEGATIVE mg/dL
Nitrite: POSITIVE — AB
Protein, ur: NEGATIVE mg/dL
Specific Gravity, Urine: 1.026 (ref 1.005–1.030)
pH: 5 (ref 5.0–8.0)

## 2022-07-29 LAB — CBC WITH DIFFERENTIAL/PLATELET
Abs Immature Granulocytes: 0.08 10*3/uL — ABNORMAL HIGH (ref 0.00–0.07)
Basophils Absolute: 0.1 10*3/uL (ref 0.0–0.1)
Basophils Relative: 0 %
Eosinophils Absolute: 0.4 10*3/uL (ref 0.0–0.5)
Eosinophils Relative: 2 %
HCT: 41.6 % (ref 36.0–46.0)
Hemoglobin: 13.8 g/dL (ref 12.0–15.0)
Immature Granulocytes: 0 %
Lymphocytes Relative: 13 %
Lymphs Abs: 2.6 10*3/uL (ref 0.7–4.0)
MCH: 29.2 pg (ref 26.0–34.0)
MCHC: 33.2 g/dL (ref 30.0–36.0)
MCV: 87.9 fL (ref 80.0–100.0)
Monocytes Absolute: 0.9 10*3/uL (ref 0.1–1.0)
Monocytes Relative: 4 %
Neutro Abs: 15.9 10*3/uL — ABNORMAL HIGH (ref 1.7–7.7)
Neutrophils Relative %: 81 %
Platelets: 265 10*3/uL (ref 150–400)
RBC: 4.73 MIL/uL (ref 3.87–5.11)
RDW: 12.4 % (ref 11.5–15.5)
WBC: 19.8 10*3/uL — ABNORMAL HIGH (ref 4.0–10.5)
nRBC: 0 % (ref 0.0–0.2)

## 2022-07-29 LAB — DIFFERENTIAL
Abs Immature Granulocytes: 0.07 10*3/uL (ref 0.00–0.07)
Basophils Absolute: 0.1 10*3/uL (ref 0.0–0.1)
Basophils Relative: 0 %
Eosinophils Absolute: 0.3 10*3/uL (ref 0.0–0.5)
Eosinophils Relative: 2 %
Immature Granulocytes: 0 %
Lymphocytes Relative: 13 %
Lymphs Abs: 2.7 10*3/uL (ref 0.7–4.0)
Monocytes Absolute: 0.9 10*3/uL (ref 0.1–1.0)
Monocytes Relative: 5 %
Neutro Abs: 16.3 10*3/uL — ABNORMAL HIGH (ref 1.7–7.7)
Neutrophils Relative %: 80 %

## 2022-07-29 LAB — COMPREHENSIVE METABOLIC PANEL
ALT: 13 U/L (ref 0–44)
AST: 17 U/L (ref 15–41)
Albumin: 3.9 g/dL (ref 3.5–5.0)
Alkaline Phosphatase: 88 U/L (ref 38–126)
Anion gap: 8 (ref 5–15)
BUN: 14 mg/dL (ref 6–20)
CO2: 21 mmol/L — ABNORMAL LOW (ref 22–32)
Calcium: 9.2 mg/dL (ref 8.9–10.3)
Chloride: 109 mmol/L (ref 98–111)
Creatinine, Ser: 0.8 mg/dL (ref 0.44–1.00)
GFR, Estimated: >60 mL/min (ref >60)
Glucose, Bld: 99 mg/dL (ref 70–99)
Potassium: 3.4 mmol/L — ABNORMAL LOW (ref 3.5–5.1)
Sodium: 138 mmol/L (ref 135–145)
Total Bilirubin: 0.8 mg/dL (ref 0.3–1.2)
Total Protein: 7.8 g/dL (ref 6.5–8.1)

## 2022-07-29 LAB — LIPASE, BLOOD: Lipase: 35 U/L (ref 11–51)

## 2022-07-29 LAB — HCG, QUANTITATIVE, PREGNANCY: hCG, Beta Chain, Quant, S: <1 m[IU]/mL (ref <5)

## 2022-07-29 MED ORDER — SODIUM CHLORIDE 0.9 % IV BOLUS
1000.0000 mL | Freq: Once | INTRAVENOUS | Status: AC
  Administered 2022-07-29: 1000 mL via INTRAVENOUS

## 2022-07-29 MED ORDER — KETOROLAC TROMETHAMINE 30 MG/ML IJ SOLN
15.0000 mg | Freq: Once | INTRAMUSCULAR | Status: AC
  Administered 2022-07-29: 15 mg via INTRAVENOUS
  Filled 2022-07-29: qty 1

## 2022-07-29 MED ORDER — CEPHALEXIN 500 MG PO CAPS: 500.0000 mg | ORAL_CAPSULE | Freq: Three times a day (TID) | ORAL | 0 refills | Status: DC

## 2022-07-29 MED ORDER — IBUPROFEN 800 MG PO TABS: 800.0000 mg | ORAL_TABLET | Freq: Three times a day (TID) | ORAL | 0 refills | Status: DC | PRN

## 2022-07-29 MED ORDER — PHENAZOPYRIDINE HCL 200 MG PO TABS: 200.0000 mg | ORAL_TABLET | Freq: Three times a day (TID) | ORAL | 0 refills | Status: DC | PRN

## 2022-07-29 MED ORDER — HYDROCODONE-ACETAMINOPHEN 5-325 MG PO TABS: 1.0000 | ORAL_TABLET | Freq: Four times a day (QID) | ORAL | 0 refills | Status: DC | PRN

## 2022-07-29 MED ORDER — SODIUM CHLORIDE 0.9 % IV SOLN
1.0000 g | Freq: Once | INTRAVENOUS | Status: AC
  Administered 2022-07-29: 1 g via INTRAVENOUS
  Filled 2022-07-29: qty 10

## 2022-07-29 NOTE — ED Provider Notes (Signed)
[facility] Provider Note    Event Date/Time   First MD Initiated Contact with Patient 07/29/22 [phone]     (approximate)   History   Abdominal Pain   HPI  Kylie Martin is a 31 y.o. female who presents to the ED from home with a chief complaint of abdominal pain.  Patient reports sudden suprapubic abdominal pain starting approximately 1 hour prior to arrival.  Endorses nausea and bilateral flank pain.  Complains of pressure to urinate but cannot urinate.  Straight of kidney stones.  Denies fever/chills, cough, chest pain, shortness of breath, vomiting, hematuria, diarrhea.     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 Authorizing Provider  cephALEXin (KEFLEX) 500 MG capsule Take 1 capsule (500 mg total) by mouth 3 (three) times daily. 07/29/22  Yes Irean Hong, MD  HYDROcodone-acetaminophen (NORCO) 5-325 MG tablet Take 1 tablet by mouth every 6 (six) hours as needed for moderate pain. 07/29/22  Yes Irean Hong, MD  ibuprofen (ADVIL) 800 MG tablet Take 1 tablet (800 mg total) by mouth every 8 (eight) hours as needed for moderate pain. 07/29/22  Yes Irean Hong, MD  phenazopyridine (PYRIDIUM) 200 MG tablet Take 1 tablet (200 mg total) by mouth 3 (three) times daily as  needed for pain. 07/29/22  Yes Irean Hong, MD  etonogestrel-ethinyl estradiol (NUVARING) 0.12-0.015 MG/24HR vaginal ring Insert vaginally and leave in place for 3 consecutive weeks, then remove for 1 week. 04/01/21   Tresea Mall, CNM  metFORMIN (GLUCOPHAGE) 500 MG tablet Take 1 tablet by mouth 2 (two) times daily. 03/13/21   [provider]  phentermine (ADIPEX-P) 37.5 MG tablet Take 37.5 mg by mouth daily. 04/29/21   [provider]  Vitamin D, Ergocalciferol, (DRISDOL) 1.25 MG (50000 UNIT) CAPS capsule Take 1 capsule by mouth once a week. 03/14/21   [provider]     Allergies  Penicillins   Family History   Family History  Problem Relation Age of Onset   Skin cancer Mother    Hypertension Father    Diabetes Father    COPD Sister        born with RSV has immune problems with lung infections   Stroke Maternal Uncle    Hypertension Paternal Aunt    Hypertension Paternal Aunt    Brain cancer Paternal Aunt    Cancer Maternal Grandmother        lung cancer   Hypertension Maternal Grandfather    Cancer Maternal Grandfather        lung   Stroke Paternal Grandmother    Cancer Paternal Grandfather  lung   Breast cancer Neg Hx      Physical Exam  Triage Vital Signs: ED Triage Vitals  Enc Vitals Group     BP 07/28/22 2337 (!) 140/102     Pulse Rate 07/28/22 2337 (!) 115     Resp 07/28/22 2337 20     Temp 07/28/22 2337 98.4 F (36.9 C)     Temp Source 07/28/22 2337 Oral     SpO2 07/28/22 2337 100 %     Weight 07/28/22 2338 205 lb (93 kg)     Height 07/28/22 2338 5\' 6"  (1.676 m)     Head Circumference --      Peak Flow --      Pain Score 07/28/22 2337 6     Pain Loc --      Pain Edu? --      Excl. in GC? --     Updated Vital Signs: BP (!) 100/58 (BP Location: Right Arm)   Pulse 82   Temp 98.1 F (36.7 C) (Oral)   Resp 16   Ht 5\' 6"  (1.676 m)   Wt 93 kg   LMP 07/03/2022 (Approximate)   SpO2 98%   BMI 33.09 kg/m     General: Awake, mild distress.  CV:  RRR.  Good peripheral perfusion.  Resp:  Normal effort.  CTA B. Abd:  Mild suprapubic abdominal pain without rebound or guarding.  No CVAT.  No distention.  Other:  No truncal vesicles.   ED Results / Procedures / Treatments  Labs (all labs ordered are listed, but only abnormal results are displayed) Labs Reviewed  COMPREHENSIVE METABOLIC PANEL - Abnormal; Notable for the following components:      Result Value   Potassium 3.4 (*)    CO2 21 (*)    All other components within normal limits  CBC - Abnormal; Notable for the following components:   WBC 20.3 (*)    All other components within normal limits  URINALYSIS, ROUTINE W REFLEX MICROSCOPIC - Abnormal; Notable for the following components:   Color, Urine YELLOW (*)    APPearance CLEAR (*)    Hgb urine dipstick SMALL (*)    Nitrite POSITIVE (*)    Leukocytes,Ua TRACE (*)    Bacteria, UA RARE (*)    All other components within normal limits  DIFFERENTIAL - Abnormal; Notable for the following components:   Neutro Abs 16.3 (*)    All other components within normal limits  CBC WITH DIFFERENTIAL/PLATELET - Abnormal; Notable for the following components:   WBC 19.8 (*)    Neutro Abs 15.9 (*)    Abs Immature Granulocytes 0.08 (*)    All other components within normal limits  LIPASE, BLOOD  HCG, QUANTITATIVE, PREGNANCY     EKG  None   RADIOLOGY I have independently visualized and interpreted patient's CT scan as well as noted the radiology interpretation:  CT renal colic study: No ureteral stone or obstruction  Official radiology report(s): CT Renal Stone Study  Result Date: 07/29/2022 CLINICAL DATA:  Flank pain, kidney stone suspected. Sudden lower abdominal pain 1 hour ago. Nausea. History of frequent urinary tract infections. EXAM: CT ABDOMEN AND PELVIS WITHOUT CONTRAST TECHNIQUE: Multidetector CT imaging of the abdomen and pelvis was performed following the standard  protocol without IV contrast. RADIATION DOSE REDUCTION: This exam was performed according to the departmental dose-optimization program which includes automated exposure control, adjustment of the mA and/or kV according to patient size and/or use of iterative reconstruction technique.  COMPARISON:  None Available. FINDINGS: Lower chest: Lung bases are clear. Hepatobiliary: No focal liver abnormality is seen. No gallstones, gallbladder wall thickening, or biliary dilatation. Pancreas: Unremarkable. No pancreatic ductal dilatation or surrounding inflammatory changes. Spleen: Normal in size without focal abnormality. Adrenals/Urinary Tract: No adrenal gland nodules. Atrophic left kidney with scarring in the right kidney. Bilateral intrarenal stones. Largest is on the right lower pole measuring 5 mm diameter. No hydronephrosis or hydroureter. No ureteral stones identified. Bladder is normal. Stomach/Bowel: Stomach, small bowel, and colon are not abnormally distended. No wall thickening or inflammatory changes. There is suggestion of a diverticulum of the terminal ileum, series 2, image 63 and series 5, image 66. This may represent a diverticulum or possibly a Meckel's diverticulum. No inflammatory changes are suggested. Scattered colonic diverticula without evidence of acute diverticulitis. The appendix is normal. Vascular/Lymphatic: No significant vascular findings are present. No enlarged abdominal or pelvic lymph nodes. Reproductive: Uterus and bilateral adnexa are unremarkable. Small amount of free fluid in the pelvis is likely physiologic. Other: No free air in the abdomen. Abdominal wall musculature appears intact. Musculoskeletal: No acute or significant osseous findings. IMPRESSION: 1. Multiple nonobstructing intrarenal stones bilaterally. No ureteral stone or obstruction. 2. No evidence of bowel obstruction or inflammation. Appendix is normal. Diverticulum versus Meckel's diverticulum seen off the terminal  ileum. No inflammatory changes. 3. Small amount of free fluid in the pelvis is likely physiologic. Electronically Signed   By: Lucienne Capers M.D.   On: 07/29/2022 02:45     PROCEDURES:  Critical Care performed: No  Procedures   MEDICATIONS ORDERED IN ED: Medications  cefTRIAXone (ROCEPHIN) 1 g in sodium chloride 0.9 % 100 mL IVPB (1 g Intravenous New Bag/Given 07/29/22 0457)  sodium chloride 0.9 % bolus 1,000 mL (0 mLs Intravenous Stopped 07/29/22 0218)  ketorolac (TORADOL) 30 MG/ML injection 15 mg (15 mg Intravenous Given 07/29/22 0334)  sodium chloride 0.9 % bolus 1,000 mL (0 mLs Intravenous Stopped 07/29/22 0449)     IMPRESSION / MDM / ASSESSMENT AND PLAN / ED COURSE  I reviewed the triage vital signs and the nursing notes.                             31 year old female presenting with suprapubic abdominal pain and urinary symptoms. Differential diagnosis includes, but is not limited to, ovarian cyst, ovarian torsion, acute appendicitis, diverticulitis, urinary tract infection/pyelonephritis, endometriosis, bowel obstruction, colitis, renal colic, gastroenteritis, hernia, fibroids, endometriosis, pregnancy related pain including ectopic pregnancy, etc. I have personally reviewed patient's records and note she had an urgent care visit for UTI in September 2022.  Patient's presentation is most consistent with acute complicated illness / injury requiring diagnostic workup.  Laboratory results demonstrate leukocytosis WBC almost 20, normal electrolytes.  Patient does not have the urge to urinate.  LMP almost 1 month ago; will check beta-hCG.  If negative, will proceed with IV ketorolac for pain and CT renal stone study.  In the meantime we will initiate IV fluid resuscitation.  Will reassess.  Clinical Course as of 07/29/22 0501  Wed Jul 29, 2022  0241 hCG negative, will administer IV ketorolac. [JS]  [id number] Went into the room to update patient and she is sound asleep.  Has not  urinated yet; will administer second liter IV fluids. [JS]  0444 Nitrite and leukocyte positive UTI; will administer IV Rocephin.  Patient feels better, is afebrile, not tachycardic, hemodynamically stable.  Low suspicion for  sepsis.  Anticipate discharge home after completion of IV antibiotics on Keflex and Pyridium.  Strict return precautions given.  Patient verbalizes understanding and agrees with plan of care. [JS]    Clinical Course User Index [JS] Irean Hong, MD     FINAL CLINICAL IMPRESSION(S) / ED DIAGNOSES   Final diagnoses:  Lower abdominal pain  Dysuria  Lower urinary tract infectious disease     Rx / DC Orders   ED Discharge Orders          Ordered    cephALEXin (KEFLEX) 500 MG capsule  3 times daily        07/29/22 0500    phenazopyridine (PYRIDIUM) 200 MG tablet  3 times daily PRN        07/29/22 0500    ibuprofen (ADVIL) 800 MG tablet  Every 8 hours PRN        07/29/22 0500    HYDROcodone-acetaminophen (NORCO) 5-325 MG tablet  Every 6 hours PRN        07/29/22 0500             Note:  This document was prepared using Dragon voice recognition software and may include unintentional dictation errors.   Irean Hong, MD 07/29/22 [phone]

## 2022-07-29 NOTE — Discharge Instructions (Signed)
1.  Take and finish antibiotics as prescribed (Keflex 500 mg 3 times daily x7 days). 2.  Take Pyridium for urinary pain. 3.  You may take pain medicines as needed (Motrin/Norco #15). 4.  Drink plenty of fluids daily. 5.  Return to the ER for worsening symptoms, persistent vomiting, fever or other concerns.

## 2022-09-29 ENCOUNTER — Ambulatory Visit: Payer: Self-pay | Admitting: Family Medicine

## 2022-10-08 ENCOUNTER — Ambulatory Visit
Admission: EM | Admit: 2022-10-08 | Discharge: 2022-10-08 | Disposition: A | Discharge status: Home / Self Care | Attending: Family Medicine | Admitting: Family Medicine

## 2022-10-08 ENCOUNTER — Ambulatory Visit (INDEPENDENT_AMBULATORY_CARE_PROVIDER_SITE_OTHER)

## 2022-10-08 ENCOUNTER — Ambulatory Visit

## 2022-10-08 DIAGNOSIS — M79671 Pain in right foot: Secondary | ICD-10-CM | POA: Diagnosis not present

## 2022-10-08 DIAGNOSIS — M7661 Achilles tendinitis, right leg: Secondary | ICD-10-CM

## 2022-10-08 DIAGNOSIS — S93401A Sprain of unspecified ligament of right ankle, initial encounter: Secondary | ICD-10-CM | POA: Diagnosis not present

## 2022-10-08 DIAGNOSIS — M25571 Pain in right ankle and joints of right foot: Secondary | ICD-10-CM

## 2022-10-08 MED ORDER — METHOCARBAMOL 500 MG PO TABS: 500.0000 mg | ORAL_TABLET | Freq: Two times a day (BID) | ORAL | 0 refills | Status: DC

## 2022-10-08 MED ORDER — NAPROXEN 500 MG PO TABS: 500.0000 mg | ORAL_TABLET | Freq: Two times a day (BID) | ORAL | 0 refills | Status: DC

## 2022-10-08 NOTE — Discharge Instructions (Signed)
Stop by the pharmacy to pick up your prescriptions.  Follow up with your primary care provider as needed.  

## 2022-10-08 NOTE — ED Triage Notes (Signed)
Pt c/o RT ankle swelling onset yesterday, pt states hard to walk, pt states pain when flexing foot, denies any fall or injury

## 2022-10-08 NOTE — ED Provider Notes (Signed)
[facility]    CSN: [id number] Arrival date & time: 10/08/22  1714      History   Chief Complaint Chief Complaint  Patient presents with   Ankle Pain    RT ankle/foot     HPI  HPI Kylie Martin is a 32 y.o. female.   Kylie Martin presents for right ankle pain and swelling that started yesterday. There has been a constant dull pain in her posterior and lateral right ankle. Denies fall, injury or twisting it. States she was going up and down the stairs carrying boxes as she was moving the day before. Today, she had to stop while walking because of the pain.  Denies hearing any pops or abnormal sounds.  She continues to have dull pain with movement.  Has some tightness cramping in the muscles of her leg.  No history of gout.     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 Authorizing Provider  methocarbamol (ROBAXIN) 500 MG tablet Take 1 tablet (500 mg total) by mouth 2 (two) times daily. 10/08/22  Yes Tildon Silveria, DO  naproxen (NAPROSYN) 500 MG tablet Take 1 tablet (500 mg total) by mouth 2 (two) times daily with a meal. 10/08/22  Yes Tobby Fawcett, DO  cephALEXin (KEFLEX) 500 MG  capsule Take 1 capsule (500 mg total) by mouth 3 (three) times daily. 07/29/22   Irean Hong, MD  etonogestrel-ethinyl estradiol (NUVARING) 0.12-0.015 MG/24HR vaginal ring Insert vaginally and leave in place for 3 consecutive weeks, then remove for 1 week. 04/01/21   Tresea Mall, CNM  HYDROcodone-acetaminophen (NORCO) 5-325 MG tablet Take 1 tablet by mouth every 6 (six) hours as needed for moderate pain. 07/29/22   Irean Hong, MD  ibuprofen (ADVIL) 800 MG tablet Take 1 tablet (800 mg total) by mouth every 8 (eight) hours as needed for moderate pain. 07/29/22   Irean Hong, MD  metFORMIN (GLUCOPHAGE) 500 MG tablet Take 1 tablet by mouth 2 (two) times daily. 03/13/21   [provider]  phenazopyridine (PYRIDIUM) 200 MG tablet Take 1 tablet (200 mg total) by mouth 3 (three) times daily as needed for pain. 07/29/22   Irean Hong, MD  phentermine (ADIPEX-P) 37.5 MG tablet Take 37.5 mg by mouth daily. 04/29/21   [provider]  Vitamin D, Ergocalciferol, (DRISDOL) 1.25 MG (50000 UNIT) CAPS capsule Take 1 capsule by mouth once a week. 03/14/21   [provider]    Family History Family History  Problem Relation Age of Onset   Skin cancer Mother    Hypertension Father    Diabetes Father    COPD Sister        born with RSV has immune problems with lung infections   Stroke Maternal Uncle    Hypertension Paternal Aunt    Hypertension Paternal Aunt    Brain cancer Paternal Aunt    Cancer Maternal Grandmother        lung cancer   Hypertension Maternal Grandfather    Cancer Maternal Grandfather        lung   Stroke Paternal Grandmother    Cancer Paternal Grandfather        lung   Breast cancer Neg Hx     Social History Social History   Tobacco Use   Smoking status: Former    Types: E-cigarettes, Cigarettes    Quit date: 03/23/2010    Years since quitting: 12.5   Smokeless tobacco: Never  Vaping Use   Vaping Use: Former  Substance Use Topics   Alcohol use:  Yes    Comment: socially   Drug use: No     Allergies   Penicillins   Review of Systems Review of Systems: :negative unless otherwise stated in HPI.      Physical Exam Triage Vital Signs ED Triage Vitals  Enc Vitals Group     BP 10/08/22 1922 119/89     Pulse Rate 10/08/22 1922 (!) 55     Resp --      Temp 10/08/22 1922 98.4 F (36.9 C)     Temp Source 10/08/22 1922 Oral     SpO2 10/08/22 1922 99 %     Weight 10/08/22 1921 215 lb (97.5 kg)     Height 10/08/22 1921 5\' 6"  (1.676 m)     Head Circumference --      Peak Flow --      Pain Score 10/08/22 1921 6     Pain Loc --      Pain Edu? --      Excl. in GC? --    No data found.  Updated Vital Signs BP 119/89 (BP Location: Left Arm)   Pulse (!) 55   Temp 98.4 F (36.9 C) (Oral)   Ht 5\' 6"  (1.676 m)   Wt 97.5 kg   LMP 09/27/2022 (Exact Date)   SpO2 99%   BMI 34.70 kg/m   Visual Acuity Right Eye Distance:   Left Eye Distance:   Bilateral Distance:    Right Eye Near:   Left Eye Near:    Bilateral Near:     Physical Exam GEN: well appearing female in no acute distress  CVS: well perfused  RESP: speaking in full sentences without pause, no respiratory distress  MSK:  Ankle/Foot, Right: TTP noted at the insertion of the Achilles tendon, posterior to the lateral malleolus. No visible erythema, ecchymosis, or bony deformity.  + Mild edema, No notable pes planus/cavus deformity. Transverse arch grossly intact; No evidence of tibiotalar deviation; Range of motion is full in all directions. Strength is 5/5 in all directions.  Unremarkable squeeze test; Talar dome non-tender; No pain at base of 5th MT; No tenderness at the distal metatarsals; unable to walk 4 steps without pain    UC Treatments / Results  Labs (  all labs ordered are listed, but only abnormal results are displayed) Labs Reviewed - No data to display  EKG   Radiology DG Ankle Complete Right  Result Date: 10/08/2022 CLINICAL DATA:  Right ankle  swelling since yesterday, no reported fall or injury EXAM: RIGHT ANKLE - COMPLETE 3+ VIEW; RIGHT FOOT COMPLETE - 3+ VIEW COMPARISON:  None Available. FINDINGS: There is no evidence of fracture, dislocation, or joint effusion. Joint spaces are well preserved. Moderate plantar calcaneal spur. Mild diffuse soft tissue edema about the foot and ankle. IMPRESSION: 1. No fracture or dislocation of the right foot or ankle. Joint spaces are well preserved. 2. Mild diffuse soft tissue edema about the foot and ankle. Electronically Signed   By: Delanna Ahmadi M.D.   On: 10/08/2022 19:46   DG Foot Complete Right  Result Date: 10/08/2022 CLINICAL DATA:  Right ankle swelling since yesterday, no reported fall or injury EXAM: RIGHT ANKLE - COMPLETE 3+ VIEW; RIGHT FOOT COMPLETE - 3+ VIEW COMPARISON:  None Available. FINDINGS: There is no evidence of fracture, dislocation, or joint effusion. Joint spaces are well preserved. Moderate plantar calcaneal spur. Mild diffuse soft tissue edema about the foot and ankle. IMPRESSION: 1. No fracture or dislocation of the right foot or ankle. Joint spaces are well preserved. 2. Mild diffuse soft tissue edema about the foot and ankle. Electronically Signed   By: Delanna Ahmadi M.D.   On: 10/08/2022 19:46    Procedures Procedures (including critical care time)  Medications Ordered in UC Medications - No data to display  Initial Impression / Assessment and Plan / UC Course  I have reviewed the triage vital signs and the nursing notes.  Pertinent labs & imaging results that were available during my care of the patient were reviewed by me and considered in my medical decision making (see chart for details).      Pt is a 32 y.o.  female with right ankle pain for the past 2 days after moving boxes during her move. Obtained right ankle plain films.  Personally reviewed by me were unremarkable for fracture or dislocation. Radiologist notes mild diffuse soft tissue swelling about the foot  and ankle. On exam, pt has tenderness at insertion of the achilles tendon. Negative Squeeze test.  I suspect achilles tendinopathy.   She was given a ankle brace and crutches prior to discharge.  Patient to gradually return to normal activities, as tolerated and continue ordinary activities within the limits permitted by pain. Prescribed Naproxen sodium  and muscle relaxer  for pain relief.  Tylenol PRN. Advised patient to avoid OTC NSAIDs while taking prescription NSAID. Counseled patient on red flag symptoms and when to seek immediate care.  Patient to follow up with orthopedic provider, if symptoms do not improve with conservative treatment.  Return and ED precautions given. Understanding voiced. Discussed MDM, treatment plan and plan for follow-up with patient who agrees with plan.   Final Clinical Impressions(s) / UC Diagnoses   Final diagnoses:  Sprain of right ankle, unspecified ligament, initial encounter  Achilles tendinitis of right lower extremity     Discharge Instructions      Stop by the pharmacy to pick up your prescriptions.  Follow up with your primary care provider as needed.      ED Prescriptions     Medication Sig Dispense Auth. Provider   methocarbamol (ROBAXIN) 500 MG tablet Take 1 tablet (500 mg total) by mouth 2 (two) times daily. 20 tablet Lyndee Hensen, DO  naproxen (NAPROSYN) 500 MG tablet Take 1 tablet (500 mg total) by mouth 2 (two) times daily with a meal. 30 tablet Osmin Welz, DO      PDMP not reviewed this encounter.   Lyndee Hensen, DO 10/08/22 2127

## 2022-11-02 ENCOUNTER — Ambulatory Visit: Payer: Managed Care, Other (non HMO) | Admitting: Family Medicine

## 2022-11-02 ENCOUNTER — Encounter: Payer: Self-pay | Admitting: Family Medicine

## 2022-11-02 VITALS — BP 120/78 | HR 71 | Ht 66.0 in | Wt 218.0 lb

## 2022-11-02 DIAGNOSIS — M7661 Achilles tendinitis, right leg: Secondary | ICD-10-CM

## 2022-11-02 MED ORDER — MELOXICAM 15 MG PO TABS: 15.0000 mg | ORAL_TABLET | Freq: Every day | ORAL | 0 refills | Status: DC

## 2022-11-02 NOTE — Patient Instructions (Addendum)
-  Start meloxicam, dose daily with food x 2 weeks then daily as-needed (no other NSAIDs while on this medication) - Use heel cup inserts (can use on both side for symmetry) in sneakers - After 1-2 weeks, start and gradually progress home exercises with information provided - Limit to activity as tolerated using heel symptoms as a guide - Return in 4 weeks

## 2022-11-02 NOTE — Progress Notes (Signed)
     Primary Care / Sports Medicine Office Visit  Patient Information:  Patient ID: Kylie Martin, female DOB: 04/04/1991 Age: 32 y.o. MRN: [id number]   Kylie Martin is a pleasant 32 y.o. female presenting with the following:  Chief Complaint  Patient presents with   Ankle Pain    Right, for 3 weeks, no known injury. Stopped wearing brace because it was cheap    Vitals:   11/02/22 0855  BP: 120/78  Pulse: 71  SpO2: 97%   Vitals:   11/02/22 0855  Weight: 218 lb (98.9 kg)  Height: 5\' 6"  (1.676 m)   Body mass index is 35.19 kg/m.     Independent interpretation of notes and tests performed by another provider:   Independent interpretation of right foot and ankle x-rays dated 10/08/2022 reveal no acute osseous abnormalities, there is calcaneal spur, generalized soft tissue swelling about the ankle  Procedures performed:   None  Pertinent History, Exam, Impression, and Recommendations:   Kylie Martin was seen today for ankle pain.  Achilles tendinitis, right leg Overview: Onset: Early January 2024  Assessment & Plan: Acute and atraumatic onset at the beginning of January following repetitive ascending/descending stairs as part of a move.  Initially pain was throughout her whole body which she attributed to generalized muscle soreness from activity after period of relative inactivity, following this pain remained in bilateral feet/ankles, the left foot/ankle symptoms resolved and the right foot/ankle symptoms persisted localizing to the posterior ankle, noted swelling, went to urgent care on 10/08/2022 where x-rays were obtained, lace up brace and crutches advised addition to naproxen, follow-up here.  Examination with moderate generalized swelling, range of motion is full with mild pain during passive maximal dorsiflexion localizing to the distal Achilles where there is discrete tenderness, no tendinous disruption noted, secondary tenderness at the posterior tibialis tendons, otherwise  examination benign.  Clinical history and findings are most consistent with Achilles tendinitis, given x-rays and history of sedentary work, posterior chain tightness and deconditioning coupled with ramp-up in activity at onset as primary etiology.  We reviewed various treatment strategies patient is to start scheduled meloxicam x 2 weeks then as needed, heel cup inserts, home-based rehab in 1-2 weeks, close follow-up in 4 weeks for reevaluation.  Suboptimal progress to be addressed with formal PT, cam boot considerations.  Orders: -     Meloxicam; Take 1 tablet (15 mg total) by mouth daily.  Dispense: 30 tablet; Refill: 0     Orders & Medications Meds ordered this encounter  Medications   meloxicam (MOBIC) 15 MG tablet    Sig: Take 1 tablet (15 mg total) by mouth daily.    Dispense:  30 tablet    Refill:  0   No orders of the defined types were placed in this encounter.    Return in about 4 weeks (around 11/30/2022) for Foot Follow up.     Montel Culver, MD, [facility]   Primary Care Sports Medicine Primary Care and Sports Medicine at [facility]

## 2022-11-02 NOTE — Assessment & Plan Note (Addendum)
Acute and atraumatic onset at the beginning of January following repetitive ascending/descending stairs as part of a move.  Initially pain was throughout her whole body which she attributed to generalized muscle soreness from activity after period of relative inactivity, following this pain remained in bilateral feet/ankles, the left foot/ankle symptoms resolved and the right foot/ankle symptoms persisted localizing to the posterior ankle, noted swelling, went to urgent care on 10/08/2022 where x-rays were obtained, lace up brace and crutches advised addition to naproxen, follow-up here.  Examination with moderate generalized swelling, range of motion is full with mild pain during passive maximal dorsiflexion localizing to the distal Achilles where there is discrete tenderness, no tendinous disruption noted, secondary tenderness at the posterior tibialis tendons, otherwise examination benign.  Clinical history and findings are most consistent with Achilles tendinitis, given x-rays and history of sedentary work, posterior chain tightness and deconditioning coupled with ramp-up in activity at onset as primary etiology.  We reviewed various treatment strategies patient is to start scheduled meloxicam x 2 weeks then as needed, heel cup inserts, home-based rehab in 1-2 weeks, close follow-up in 4 weeks for reevaluation.  Suboptimal progress to be addressed with formal PT, cam boot considerations.

## 2022-11-30 ENCOUNTER — Ambulatory Visit: Payer: Managed Care, Other (non HMO) | Admitting: Family Medicine

## 2022-12-03 ENCOUNTER — Ambulatory Visit
Admission: EM | Admit: 2022-12-03 | Discharge: 2022-12-03 | Disposition: A | Discharge status: Home / Self Care | Payer: Managed Care, Other (non HMO) | Attending: Emergency Medicine | Admitting: Emergency Medicine

## 2022-12-03 DIAGNOSIS — J069 Acute upper respiratory infection, unspecified: Secondary | ICD-10-CM | POA: Insufficient documentation

## 2022-12-03 LAB — GROUP A STREP BY PCR: Group A Strep by PCR: NOT DETECTED

## 2022-12-03 MED ORDER — IPRATROPIUM BROMIDE 0.06 % NA SOLN: 2.0000 | Freq: Four times a day (QID) | NASAL | 12 refills | Status: DC

## 2022-12-03 NOTE — ED Triage Notes (Signed)
Pt c/o sore throat onset today, pt states her daughter tested positive for strep today, denies any fevers

## 2022-12-03 NOTE — Discharge Instructions (Addendum)
Your strep test is negative today.  Though I do believe you have a viral respiratory infection.  Please use over-the-counter Tylenol and/or ibuprofen according to the package instructions as needed for pain or fever.  Use the Atrovent nasal spray, 2 squirts in each nostril every 6 hours, as needed for nasal congestion and drainage.  Gargle with warm salt water to help soothe your throat and wash with drainage from the tissues which could be causing you discomfort.  Mix 1 tablespoon of table salt in 8 ounces of warm water, gargle and spit.  You may also use over-the-counter Chloraseptic or Sucrets lozenges.  No more than 1 lozenge every 2 hours as the menthol may give you diarrhea.  If you develop any new or worsening symptoms please return for reevaluation or see your primary care provider.

## 2022-12-03 NOTE — ED Provider Notes (Signed)
[facility]    CSN: [id number] Arrival date & time: 12/03/22  1712      History   Chief Complaint Chief Complaint  Patient presents with   Sore Throat    HPI Kylie Martin is a 32 y.o. female.   HPI  32 year old female here for evaluation of sore throat.  Patient has a past medical history that is significant for PCOS, kidney stones, recurrent UTIs, and anemia presenting for evaluation of a sore throat that began today.  This is also associated with a runny nose but she denies nasal congestion, fever, cough, or ear pain.  Her daughter is currently positive for strep.  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 Authorizing Provider  Cholecalciferol (VITAMIN D-3 PO) Take by mouth.   Yes [provider]  ipratropium (ATROVENT) 0.06 % nasal spray Place 2 sprays into both nostrils 4 (four) times daily. 12/03/22  Yes Margarette Canada, NP  WEGOVY 0.25 MG/0.5ML SOAJ 0.5 mL Subcutaneous weekly for 28 days 11/30/22  Yes [provider]  WEGOVY 2.4 MG/0.75ML SOAJ SMARTSIG:2.4 Milligram(s) SUB-Q Once a Week 07/27/22  Yes [provider]  meloxicam (MOBIC) 15 MG tablet Take 1 tablet (15 mg total) by mouth daily. 11/02/22   Montel Culver, MD  methocarbamol (ROBAXIN) 500 MG tablet Take 1 tablet (500 mg total) by mouth 2 (two) times daily. 10/08/22   Lyndee Hensen, DO    Family History Family History  Problem Relation Age of Onset   Skin cancer Mother    Hypertension Father    Diabetes Father    COPD Sister        born with RSV has immune problems with lung infections   Stroke Maternal Uncle    Hypertension Paternal Aunt    Hypertension Paternal Aunt    Brain cancer Paternal Aunt    Cancer Maternal Grandmother        lung cancer   Hypertension Maternal Grandfather    Cancer Maternal Grandfather        lung   Stroke Paternal Grandmother    Cancer Paternal Grandfather        lung   Breast cancer Neg Hx     Social History Social History   Tobacco Use  Smoking status: Former    Types: E-cigarettes, Cigarettes    Quit date: 03/23/2010    Years since quitting: 12.7   Smokeless tobacco: Never  Vaping Use   Vaping Use: Former  Substance Use Topics   Alcohol use: Yes    Comment: socially   Drug use: No     Allergies   Penicillin g sodium and Penicillins   Review of Systems Review of Systems  Constitutional:  Negative for fever.  HENT:  Positive for congestion, rhinorrhea and sore throat. Negative for ear pain.   Respiratory:  Negative for cough.      Physical Exam Triage Vital Signs ED Triage Vitals [12/03/22 1821]  Enc Vitals Group     BP      Pulse      Resp      Temp      Temp src      SpO2      Weight 215 lb (97.5 kg)     Height '5\' 6"'$  (1.676 m)     Head Circumference      Peak Flow      Pain Score      Pain Loc      Pain Edu?      Excl. in [place]?    No data found.  Updated Vital Signs BP 130/86 (BP Location: Right Arm)   Pulse 76   Temp 98.6 F (37 C) (Oral)   Ht '5\' 6"'$  (1.676 m)   Wt 215 lb (97.5 kg)   LMP 11/27/2022   SpO2 99%   BMI 34.70 kg/m   Visual  Acuity Right Eye Distance:   Left Eye Distance:   Bilateral Distance:    Right Eye Near:   Left Eye Near:    Bilateral Near:     Physical Exam Vitals and nursing note reviewed.  Constitutional:      Appearance: Normal appearance. She is not ill-appearing.  HENT:     Head: Normocephalic and atraumatic.     Right Ear: Tympanic membrane, ear canal and external ear normal. There is no impacted cerumen.     Left Ear: Tympanic membrane, ear canal and external ear normal. There is no impacted cerumen.     Nose: Congestion and rhinorrhea present.     Comments: Nasal mucosa is erythematous and edematous with clear discharge in both nares.    Mouth/Throat:     Mouth: Mucous membranes are moist.     Pharynx: Posterior oropharyngeal erythema present. No oropharyngeal exudate.     Comments: Tonsillar pillars are 1+ edematous and erythematous but I do not appreciate any exudate.  There is a strep odor on the patient's breath. Cardiovascular:     Rate and Rhythm: Normal rate and regular rhythm.     Pulses: Normal pulses.     Heart sounds: Normal heart sounds. No murmur heard.    No friction rub. No gallop.  Pulmonary:     Effort: Pulmonary effort is normal.     Breath sounds: Normal breath sounds. No wheezing, rhonchi or rales.  Musculoskeletal:     Cervical back: Normal range of motion and neck supple.  Lymphadenopathy:     Cervical: No cervical adenopathy.  Skin:    General: Skin is warm and dry.     Capillary Refill: Capillary refill takes less than 2 seconds.  Neurological:     Mental Status: She is alert.      UC Treatments / Results  Labs (all labs ordered are listed, but  only abnormal results are displayed) Labs Reviewed  GROUP A STREP BY PCR    EKG   Radiology No results found.  Procedures Procedures (including critical care time)  Medications Ordered in UC Medications - No data to display  Initial Impression / Assessment and Plan / UC Course  I have reviewed  the triage vital signs and the nursing notes.  Pertinent labs & imaging results that were available during my care of the patient were reviewed by me and considered in my medical decision making (see chart for details).   Patient is a pleasant, nontoxic-appearing 32 year old female here for evaluation of acute onset of sore throat with associated runny nose and nasal congestion that started today.  Her daughter is currently positive for strep.  Patient denies any other upper or lower respiratory symptoms.  On exam she does have erythematous 1+ edematous tonsillar pillars but they are free of exudate.  However, there is a strep odor on the patient's breath.  Given her strep exposure and her clinical exam presentation I will order a strep PCR.  Strep PCR is negative.  I will discharge patient home with diagnosis of viral URI.  I will prescribe her some Atrovent nasal spray that she can use 4 times a day to help with nasal congestion.  Over-the-counter Tylenol and/or ibuprofen as needed for pain along with salt water gargles and Chloraseptic or Sucrets lozenges.  Return precautions reviewed.   Final Clinical Impressions(s) / UC Diagnoses   Final diagnoses:  Viral upper respiratory tract infection     Discharge Instructions      Your strep test is negative today.  Though I do believe you have a viral respiratory infection.  Please use over-the-counter Tylenol and/or ibuprofen according to the package instructions as needed for pain or fever.  Use the Atrovent nasal spray, 2 squirts in each nostril every 6 hours, as needed for nasal congestion and drainage.  Gargle with warm salt water to help soothe your throat and wash with drainage from the tissues which could be causing you discomfort.  Mix 1 tablespoon of table salt in 8 ounces of warm water, gargle and spit.  You may also use over-the-counter Chloraseptic or Sucrets lozenges.  No more than 1 lozenge every 2 hours as the menthol may give  you diarrhea.  If you develop any new or worsening symptoms please return for reevaluation or see your primary care provider.     ED Prescriptions     Medication Sig Dispense Auth. Provider   ipratropium (ATROVENT) 0.06 % nasal spray Place 2 sprays into both nostrils 4 (four) times daily. 15 mL Margarette Canada, NP      PDMP not reviewed this encounter.   Margarette Canada, NP 12/03/22 1900

## 2023-01-11 ENCOUNTER — Ambulatory Visit
Admission: EM | Admit: 2023-01-11 | Discharge: 2023-01-11 | Disposition: A | Discharge status: Home / Self Care | Payer: Managed Care, Other (non HMO) | Attending: Physician Assistant | Admitting: Physician Assistant

## 2023-01-11 DIAGNOSIS — R109 Unspecified abdominal pain: Secondary | ICD-10-CM | POA: Diagnosis not present

## 2023-01-11 DIAGNOSIS — Z3202 Encounter for pregnancy test, result negative: Secondary | ICD-10-CM | POA: Diagnosis present

## 2023-01-11 DIAGNOSIS — R3915 Urgency of urination: Secondary | ICD-10-CM | POA: Diagnosis not present

## 2023-01-11 LAB — URINALYSIS, W/ REFLEX TO CULTURE (INFECTION SUSPECTED)
Bilirubin Urine: NEGATIVE
Glucose, UA: NEGATIVE mg/dL
Hgb urine dipstick: NEGATIVE
Ketones, ur: NEGATIVE mg/dL
Nitrite: NEGATIVE
Protein, ur: NEGATIVE mg/dL
Specific Gravity, Urine: 1.01 (ref 1.005–1.030)
Squamous Epithelial / HPF: >50 /HPF (ref 0–5)
pH: 7 (ref 5.0–8.0)

## 2023-01-11 LAB — WET PREP, GENITAL
Clue Cells Wet Prep HPF POC: NONE SEEN
Sperm: NONE SEEN
Trich, Wet Prep: NONE SEEN
WBC, Wet Prep HPF POC: <10 (ref <10)
Yeast Wet Prep HPF POC: NONE SEEN

## 2023-01-11 LAB — PREGNANCY, URINE: Preg Test, Ur: NEGATIVE

## 2023-01-11 NOTE — ED Triage Notes (Signed)
Patient with c/o bilateral flank pain for a couple of days. States Sunday she had chills and was nauseous, but that has resolved. C/o pressure with urination.

## 2023-01-11 NOTE — ED Provider Notes (Signed)
[facility]    CSN: [id number] Arrival date & time: 01/11/23  0805      History   Chief Complaint Chief Complaint  Patient presents with   Flank Pain    HPI Kylie Martin is a 32 y.o. female presenting for a 2 to 3-day history of bilateral flank pain, nausea and chills.  She also reports pressure with urination and urinary frequency and urgency.  She does report that she has been trying to drink a gallon of water a day as advised by her nutritionist.  She denies any associated fevers but has not had a thermometer to check.  Denies abdominal pain, vomiting, vaginal discharge, itching or odor.  She has some concerns for possible UTI.  Reports that she had multiple sexual encounters with her husband last week as they are trying to get pregnant.  She reports that she does not always get up and use the bathroom after intercourse.  She has not taken any OTC meds for symptoms.  She has no other complaints or concerns.  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 Authorizing Provider  Cholecalciferol (VITAMIN D-3 PO) Take by mouth.    [provider]  ipratropium (ATROVENT) 0.06 % nasal spray Place 2 sprays into both nostrils 4 (four) times daily. 12/03/22   Becky Augustayan, Jeremy, NP  WEGOVY 0.25 MG/0.5ML SOAJ 0.5 mL Subcutaneous weekly for 28 days 11/30/22   [provider]    Family History Family History  Problem Relation Age of Onset   Skin cancer Mother    Hypertension Father    Diabetes Father    COPD Sister        born with RSV has immune problems with lung infections   Stroke Maternal Uncle    Hypertension Paternal Aunt    Hypertension Paternal Aunt    Brain cancer Paternal Aunt    Cancer Maternal Grandmother        lung cancer   Hypertension Maternal Grandfather    Cancer Maternal Grandfather        lung   Stroke Paternal Grandmother    Cancer Paternal Grandfather        lung   Breast cancer Neg Hx     Social History Social  History   Tobacco Use   Smoking status: Former    Types: E-cigarettes, Cigarettes    Quit date: 03/23/2010    Years since quitting: 12.8   Smokeless tobacco: Never  Vaping Use   Vaping Use: Former  Substance Use Topics   Alcohol use: Yes    Comment: socially   Drug use: No     Allergies   Penicillin g sodium and Penicillins   Review of Systems Review of Systems  Constitutional:  Positive for chills and fatigue. Negative for fever.  HENT:  Negative for congestion.   Respiratory:  Negative for cough and shortness of breath.   Cardiovascular:  Negative for chest pain.  Gastrointestinal:  Positive for nausea. Negative for abdominal pain, diarrhea and vomiting.  Genitourinary:  Positive for flank pain. Negative for difficulty urinating, dysuria, frequency, hematuria, menstrual problem and vaginal discharge.  Musculoskeletal:  Positive for back pain.  Neurological:  Negative for weakness and numbness.     Physical Exam Triage Vital Signs ED Triage Vitals  Enc Vitals Group     BP      Pulse      Resp      Temp      Temp src      SpO2      Weight       Height      Head Circumference      Peak Flow      Pain Score      Pain Loc      Pain Edu?      Excl. in [place]?    No data found.  Updated Vital Signs BP 129/62 (BP Location: Right Arm)   Pulse 62   Temp 98.6 F (37 C) (Oral)   Resp 18   LMP 12/23/2022 (Exact Date)   SpO2 98%      Physical Exam Vitals and nursing note reviewed.  Constitutional:      General: She is not in acute distress.    Appearance: Normal appearance. She is not ill-appearing or toxic-appearing.  HENT:     Head: Normocephalic and atraumatic.     Nose: Nose normal.     Mouth/Throat:     Mouth: Mucous membranes are moist.     Pharynx: Oropharynx is clear.  Eyes:     General: No scleral icterus.       Right eye: No discharge.        Left eye: No discharge.     Conjunctiva/sclera: Conjunctivae normal.  Cardiovascular:     Rate and Rhythm: Normal rate and regular rhythm.     Heart sounds: Normal heart sounds.  Pulmonary:     Effort: Pulmonary effort is normal. No respiratory distress.     Breath sounds: Normal breath sounds.  Abdominal:     Palpations: Abdomen is soft.     Tenderness: There is abdominal tenderness (generalized lower abdominal). There is no right CVA tenderness or left CVA tenderness.  Musculoskeletal:     Cervical back: Neck supple.  Skin:    General: Skin is dry.  Neurological:     General: No focal deficit present.     Mental Status: She is alert. Mental status is at baseline.     Motor: No weakness.     Gait: Gait normal.  Psychiatric:        Mood and Affect: Mood normal.        Behavior: Behavior normal.        Thought Content: Thought content  normal.      UC Treatments / Results  Labs (all labs ordered are listed, but only abnormal results are displayed) Labs Reviewed  URINALYSIS, W/ REFLEX TO CULTURE (INFECTION SUSPECTED) - Abnormal; Notable for the following components:      Result Value   Leukocytes,Ua TRACE (*)    Bacteria, UA FEW (*)    All other components  within normal limits  WET PREP, GENITAL  PREGNANCY, URINE  CERVICOVAGINAL ANCILLARY ONLY    EKG   Radiology No results found.  Procedures Procedures (including critical care time)  Medications Ordered in UC Medications - No data to display  Initial Impression / Assessment and Plan / UC Course  I have reviewed the triage vital signs and the nursing notes.  Pertinent labs & imaging results that were available during my care of the patient were reviewed by me and considered in my medical decision making (see chart for details).   32 y/o female presents for bilateral intermittent flank pain, nausea, chills, urinary frequency/urgency that began 2 days ago.  Vitals normal. Patient overall well appearing. Abdomen soft with generalized lower abdominal TTP. No CVA tenderness. Chest is clear.   Urinalysis and urine pregnancy test obtained.  UA not consistent with UTI.  Urine Pregnancy negative.  Wet prep negative.  Pending GC and Chlamydia testing.  Patient reports she is due to start her menstrual period in the next week.  Advised patient symptoms could be potentially related to PMS.  She reports a history of PCOS and says sometimes she has more uncomfortable menstrual periods.  Encouraged her to increase rest and fluids and take Tylenol as needed for any discomfort.  Reviewed return and ED precautions.   Final Clinical Impressions(s) / UC Diagnoses   Final diagnoses:  Urinary urgency  Flank pain  Encounter for pregnancy test with result negative     Discharge Instructions      -The urine is not consistent with UTI. -Vaginal swab negative for BV and yeast infection.  Pending gonorrhea and Chlamydia testing. - Will contact you with any positive results and recommend treatment.  All results will come through MyChart. - At this time, symptoms could be related to PMS.  Increase your fluid intake.  Tylenol as needed for any pelvic cramping or discomfort. - If you begin to have  painful urination, worsening flank pain, blood in urine, fever, etc., please return or go to ER for reevaluation.     ED Prescriptions   None    PDMP not reviewed this encounter.   Shirlee Latch, PA-C 01/11/23 [phone]

## 2023-01-11 NOTE — Discharge Instructions (Signed)
-  The urine is not consistent with UTI. -Vaginal swab negative for BV and yeast infection.  Pending gonorrhea and Chlamydia testing. - Will contact you with any positive results and recommend treatment.  All results will come through MyChart. - At this time, symptoms could be related to PMS.  Increase your fluid intake.  Tylenol as needed for any pelvic cramping or discomfort. - If you begin to have painful urination, worsening flank pain, blood in urine, fever, etc., please return or go to ER for reevaluation.

## 2023-01-12 LAB — CERVICOVAGINAL ANCILLARY ONLY
Chlamydia: NEGATIVE
Comment: NEGATIVE
Comment: NEGATIVE
Comment: NORMAL
Neisseria Gonorrhea: NEGATIVE
Trichomonas: NEGATIVE

## 2023-01-21 ENCOUNTER — Encounter: Payer: Self-pay | Admitting: Family Medicine

## 2023-01-21 ENCOUNTER — Ambulatory Visit: Payer: Managed Care, Other (non HMO) | Admitting: Family Medicine

## 2023-01-21 VITALS — BP 120/70 | HR 70 | Ht 66.0 in | Wt 216.0 lb

## 2023-01-21 DIAGNOSIS — R5383 Other fatigue: Secondary | ICD-10-CM | POA: Diagnosis not present

## 2023-01-21 DIAGNOSIS — E282 Polycystic ovarian syndrome: Secondary | ICD-10-CM | POA: Diagnosis not present

## 2023-01-21 NOTE — Progress Notes (Signed)
Date:  01/21/2023   Name:  Kylie Martin   DOB:  10-01-1991   MRN:  [id number]   Chief Complaint: Fatigue  Patient is a 32 year old female who presents for a establish care exam. The patient reports the following problems: PCOS/fatigue. Health maintenance has been reviewed up to date      Lab Results  Component Value Date   NA 138 07/28/2022   K 3.4 (L) 07/28/2022   CO2 21 (L) 07/28/2022   GLUCOSE 99 07/28/2022   BUN 14 07/28/2022   CREATININE 0.80 07/28/2022   CALCIUM 9.2 07/28/2022   GFRNONAA >60 07/28/2022   Lab Results  Component Value Date   CHOL 146 11/13/2015   HDL 52 11/13/2015   LDLCALC 83 11/13/2015   TRIG 54 11/13/2015   CHOLHDL 2.8 11/13/2015   Lab Results  Component Value Date   TSH 2.976 03/06/2020   Lab Results  Component Value Date   HGBA1C 5.4 03/26/2021   Lab Results  Component Value Date   WBC 20.3 (H) 07/28/2022   WBC 19.8 (H) 07/28/2022   HGB 13.7 07/28/2022   HGB 13.8 07/28/2022   HCT 41.6 07/28/2022   HCT 41.6 07/28/2022   MCV 87.9 07/28/2022   MCV 87.9 07/28/2022   PLT 259 07/28/2022   PLT 265 07/28/2022   Lab Results  Component Value Date   ALT 13 07/28/2022   AST 17 07/28/2022   GGT 9 11/13/2015   ALKPHOS 88 07/28/2022   BILITOT 0.8 07/28/2022   No results found for: "25OHVITD2", "25OHVITD3", "VD25OH"   Review of Systems  Constitutional:  Positive for fatigue and unexpected weight change.  HENT:  Negative for trouble swallowing.   Eyes:  Negative for visual disturbance.  Respiratory:  Negative for chest tightness, shortness of breath and wheezing.   Cardiovascular:  Negative for chest pain and palpitations.  Gastrointestinal:  Negative for blood in stool, constipation and diarrhea.  Endocrine: Negative for polydipsia and polyuria.  Genitourinary:  Positive for menstrual problem. Negative for difficulty urinating.       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  Current Meds  Medication Sig   WEGOVY 0.25 MG/0.5ML [facility] doctor   [DISCONTINUED] Cholecalciferol (VITAMIN D-3 PO) Take by mouth.   [DISCONTINUED] ipratropium (ATROVENT) 0.06 % nasal spray Place 2 sprays into both nostrils 4 (four) times daily.       01/21/2023    1:41 PM  GAD 7 : Generalized Anxiety Score  Nervous, Anxious, on Edge 0  Control/stop worrying 0  Worry too much - different things 0  Trouble relaxing 0  Restless 0  Easily annoyed or irritable 0  Afraid - awful might happen 0  Total GAD 7 Score 0  Anxiety Difficulty Not difficult at all       01/21/2023    1:40 PM  Depression screen PHQ 2/9  Decreased Interest 0  Down, Depressed, Hopeless 1  PHQ - 2 Score 1  Altered sleeping 1  Tired, decreased energy 2  Change in appetite 0  Feeling bad or failure about yourself  0  Trouble concentrating 0  Moving slowly or fidgety/restless 0  Suicidal thoughts 0  PHQ-9 Score 4  Difficult doing work/chores Not difficult at all    BP Readings from Last 3 Encounters:  01/21/23 120/70  01/11/23 129/62  12/03/22 130/86    Physical Exam Vitals and nursing note reviewed. Exam conducted with a chaperone present.  Constitutional:       General: She is not in acute distress.    Appearance: She is not diaphoretic.  HENT:     Head: Normocephalic and atraumatic.     Right Ear: External ear normal.     Left Ear: External ear normal.     Nose: Nose normal.  Eyes:     General:        Right eye: No discharge.        Left eye: No discharge.     Conjunctiva/sclera: Conjunctivae normal.     Pupils: Pupils are equal, round, and reactive to light.  Neck:     Thyroid: No thyromegaly.     Vascular: No JVD.  Cardiovascular:     Rate and Rhythm: Normal rate and regular rhythm.     Heart sounds: Normal heart sounds. No murmur heard.    No friction rub. No gallop.  Pulmonary:     Effort: Pulmonary effort is normal.     Breath sounds: Normal breath sounds. No wheezing, rhonchi or rales.  Abdominal:     General: Bowel sounds are normal.     Palpations: Abdomen is soft. There is no mass.     Tenderness: There is no abdominal tenderness. There is no guarding.  Musculoskeletal:        General: Normal range of motion.     Cervical back: Normal range of motion and neck supple.  Lymphadenopathy:     Cervical: No cervical adenopathy.  Skin:    General: Skin is warm and dry.  Neurological:     Mental Status: She is alert.     Deep Tendon Reflexes: Reflexes are normal and symmetric.     Reflex Scores:      Patellar reflexes are 2+ on the right side and 2+ on the left side.    Wt Readings from Last 3 Encounters:  01/21/23 216 lb (98 kg)  12/03/22 215 lb (97.5 kg)  11/02/22 218 lb (98.9 kg)    BP 120/70   Pulse 70   Ht  (1.676 m)   Wt 216 lb (98 kg)   LMP 01/21/2023 (Exact Date)   SpO2 96%   BMI 34.86 kg/m   Assessment and Plan:  1. Fatigue, unspecified type Chronic.  Episodic and persistent.  Patient is also frustrated with inability to lose weight.  Previous TSH was normal but has been a duration since last evaluated and without T4 T3 are up-to-date.  We will therefore obtain a thyroid panel with TSH sed  rate and renal function panel. - Thyroid Panel With  TSH - Sed Rate (ESR) - Renal Function Panel  2. PCOS (polycystic ovarian syndrome) Chronic.  Persistent.  Patient has some hirsutism and there was review of labs noted to have upper limits of normal testosterone 70 cutoff 71 and A1c of 5.4 with a cutoff of 5.7 for prediabetes.  Patient previously was on metformin but that was discontinued for some reason that I am not certain.  Patient has seen gynecology in the past and there was no mention of spironolactone for the androgenic effects of PCOS.  We will refer to GYN for continued evaluation and likely Pap smears in the future. - Ambulatory referral to Gynecology    Elizabeth Sauer, MD

## 2023-01-22 LAB — RENAL FUNCTION PANEL
Albumin: 4.2 g/dL (ref 4.0–5.0)
BUN/Creatinine Ratio: 13 (ref 9–23)
BUN: 11 mg/dL (ref 6–20)
CO2: 21 mmol/L (ref 20–29)
Calcium: 8.8 mg/dL (ref 8.7–10.2)
Chloride: 102 mmol/L (ref 96–106)
Creatinine, Ser: 0.83 mg/dL (ref 0.57–1.00)
Glucose: 95 mg/dL (ref 70–99)
Phosphorus: 3.1 mg/dL (ref 3.0–4.3)
Potassium: 4.1 mmol/L (ref 3.5–5.2)
Sodium: 141 mmol/L (ref 134–144)
eGFR: 97 mL/min/{1.73_m2} (ref >59)

## 2023-01-22 LAB — THYROID PANEL WITH TSH
Free Thyroxine Index: 2.2 (ref 1.2–4.9)
T3 Uptake Ratio: 31 % (ref 24–39)
T4, Total: 7.2 ug/dL (ref 4.5–12.0)
TSH: 3.51 u[IU]/mL (ref 0.450–4.500)

## 2023-01-22 LAB — SEDIMENTATION RATE: Sed Rate: 5 mm/hr (ref 0–32)

## 2023-02-19 ENCOUNTER — Encounter: Admitting: Licensed Practical Nurse

## 2023-02-22 ENCOUNTER — Encounter: Payer: Self-pay | Admitting: Licensed Practical Nurse

## 2023-03-02 ENCOUNTER — Ambulatory Visit: Payer: Managed Care, Other (non HMO) | Admitting: Family Medicine

## 2023-06-04 ENCOUNTER — Ambulatory Visit
Admission: EM | Admit: 2023-06-04 | Discharge: 2023-06-04 | Disposition: A | Discharge status: Home / Self Care | Attending: Internal Medicine | Admitting: Internal Medicine

## 2023-06-04 ENCOUNTER — Ambulatory Visit

## 2023-06-04 ENCOUNTER — Encounter: Payer: Self-pay | Admitting: Emergency Medicine

## 2023-06-04 DIAGNOSIS — N760 Acute vaginitis: Secondary | ICD-10-CM

## 2023-06-04 DIAGNOSIS — R1031 Right lower quadrant pain: Secondary | ICD-10-CM

## 2023-06-04 DIAGNOSIS — B9689 Other specified bacterial agents as the cause of diseases classified elsewhere: Secondary | ICD-10-CM | POA: Diagnosis not present

## 2023-06-04 LAB — URINALYSIS, W/ REFLEX TO CULTURE (INFECTION SUSPECTED)
Bilirubin Urine: NEGATIVE
Glucose, UA: NEGATIVE mg/dL
Ketones, ur: 40 mg/dL — AB
Nitrite: NEGATIVE
Protein, ur: NEGATIVE mg/dL
Specific Gravity, Urine: 1.02 (ref 1.005–1.030)
pH: 6.5 (ref 5.0–8.0)

## 2023-06-04 LAB — WET PREP, GENITAL
Sperm: NONE SEEN
Trich, Wet Prep: NONE SEEN
WBC, Wet Prep HPF POC: >=10 — AB (ref <10)
Yeast Wet Prep HPF POC: NONE SEEN

## 2023-06-04 MED ORDER — METRONIDAZOLE 0.75 % VA GEL: 1.0000 | Freq: Every day | VAGINAL | 0 refills | Status: AC

## 2023-06-04 NOTE — ED Provider Notes (Signed)
[facility]    CSN: [id number] Arrival date & time: 06/04/23  1629      History   Chief Complaint Chief Complaint  Patient presents with   Abdominal Pain    HPI Kylie Martin is a 32 y.o. female who presents with lower abdominal pain x 3 days.She thought it was due to constipation since she was having small stool size BM's, so she took Benafiber and has been having BM's every day, but the pain is not  better. She denies UTI symptoms or abnormal vaginal discharge.     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 Authorizing Provider  prazosin (MINIPRESS) 1 MG capsule Take 1 mg by mouth at bedtime. 03/22/23  Yes [provider]  sertraline (ZOLOFT) 50 MG tablet Take 50 mg by mouth daily. 03/22/23  Yes [provider]  traZODone (DESYREL) 50 MG tablet Take 50 mg by mouth at bedtime. 03/22/23  Yes [provider]  Reginal Lutes 0.25 MG/0.5ML [facility] doctor 11/30/22  Yes [provider]  WEGOVY 2.4 MG/0.75ML SOAJ Inject into the skin. 05/20/23  Yes [provider]    Family  History Family History  Problem Relation Age of Onset   Hypertension Mother    Skin cancer Mother    Cancer Father    Hypertension Father    Diabetes Father    COPD Sister        born with RSV has immune problems with lung infections   Stroke Maternal Uncle    Hypertension Paternal Aunt    Hypertension Paternal Aunt    Brain cancer Paternal Aunt    Cancer Maternal Grandmother        lung cancer   Hypertension Maternal Grandfather    Cancer Maternal Grandfather        lung   Stroke Paternal Grandmother    Cancer Paternal Grandfather        lung   Breast cancer Neg Hx     Social History Social History   Tobacco Use   Smoking status: Former    Current packs/day: 0.00    Types: E-cigarettes, Cigarettes    Quit date: 03/23/2010    Years since quitting: 13.2   Smokeless tobacco: Never  Tobacco comments:    Does not use e-cigs  Vaping Use   Vaping status: Former  Substance Use Topics   Alcohol use: Yes    Comment: socially   Drug use: No     Allergies   Penicillin g sodium and Penicillins   Review of Systems Review of Systems As noted in HPI  Physical Exam Triage Vital Signs ED Triage Vitals  Encounter Vitals Group     BP 06/04/23 1646 (!) 144/78     Systolic BP Percentile --      Diastolic BP Percentile --      Pulse Rate 06/04/23 1646 62     Resp 06/04/23 1646 14     Temp 06/04/23 1646 98.7 F (37.1 C)     Temp Source 06/04/23 1646 Oral     SpO2 06/04/23 1646 98 %     Weight 06/04/23 1643 216 lb 0.8 oz (98 kg)     Height 06/04/23 1643 5\' 6"  (1.676 m)     Head Circumference --      Peak Flow --      Pain Score 06/04/23 1643 4     Pain Loc --      Pain Education --      Exclude from Growth Chart --    No data found.  Updated Vital Signs BP (!) 144/78 (BP Location: Right Arm)   Pulse 62   Temp 98.7 F (37.1 C) (Oral)   Resp 14   Ht 5\' 6"  (1.676 m)   Wt 216 lb 0.8 oz (98 kg)   LMP 05/15/2023 (Approximate)   SpO2 98%   BMI 34.87 kg/m    Visual Acuity Right Eye Distance:   Left Eye Distance:   Bilateral Distance:    Right Eye Near:   Left Eye Near:    Bilateral Near:     Physical Exam Vitals and nursing note reviewed.  Constitutional:      General: She is not in acute distress.    Appearance: She is obese. She is not toxic-appearing.  HENT:     Right Ear: External ear normal.     Left Ear: External ear normal.  Eyes:     General: No scleral icterus.    Conjunctiva/sclera: Conjunctivae normal.  Pulmonary:     Effort: Pulmonary effort is normal.  Abdominal:     General: Abdomen is scaphoid. Bowel sounds are normal.     Palpations: Abdomen is soft.     Tenderness: There is abdominal tenderness in the right lower quadrant. There is guarding. There is no rebound.  Musculoskeletal:        General: Normal range of motion.  Skin:    General: Skin is warm and dry.  Neurological:     Mental Status: She is alert and oriented to person, place, and time.     Gait: Gait normal.  Psychiatric:        Mood and Affect: Mood normal.        Behavior: Behavior normal.        Thought Content: Thought content normal.        Judgment: Judgment normal.      UC Treatments / Results  Labs (all labs ordered are listed, but only abnormal results are displayed) Labs Reviewed  WET PREP, GENITAL - Abnormal; Notable for the following components:      Result Value   Clue Cells Wet Prep HPF POC PRESENT (*)    WBC, Wet Prep HPF POC >=10 (*)  All other components within normal limits  URINALYSIS, W/ REFLEX TO CULTURE (INFECTION SUSPECTED)    EKG   Radiology No results found.  Procedures Procedures (including critical care time)  Medications Ordered in UC Medications - No data to display  Initial Impression / Assessment and Plan / UC Course  I have reviewed the triage vital signs and the nursing notes.  Pertinent labs & imaging results that were available during my care of the patient were reviewed by me and  considered in my medical decision making (see chart for details).  I placed her on Metrogel. Since she has not had a fever or loss of appetite and I see moderate stool on RLQ she will try Mag citrate to empty out. If this does not help the pain, then she will go to ER   Final Clinical Impressions(s) / UC Diagnoses   Final diagnoses:  None   Discharge Instructions   None    ED Prescriptions   None    PDMP not reviewed this encounter.   Garey Ham, [place] 06/04/23 1836

## 2023-06-04 NOTE — Discharge Instructions (Addendum)
Please go to the ER to have further tests done like ultrasound or cat scan which we can't do here The vaginal swab shows bacterial vaginitis and I have sent medication for this.

## 2023-06-04 NOTE — ED Triage Notes (Signed)
Patient c/o sharp lower abdominal pain when she has a bowel movement or when she urinates for 2-3 days.  Patient denies N/V/D.

## 2023-06-05 ENCOUNTER — Emergency Department: Payer: Managed Care, Other (non HMO)

## 2023-06-05 ENCOUNTER — Emergency Department
Admission: EM | Admit: 2023-06-05 | Discharge: 2023-06-06 | Disposition: A | Discharge status: Home / Self Care | Payer: Managed Care, Other (non HMO) | Attending: Emergency Medicine | Admitting: Emergency Medicine

## 2023-06-05 ENCOUNTER — Other Ambulatory Visit: Payer: Self-pay

## 2023-06-05 DIAGNOSIS — R300 Dysuria: Secondary | ICD-10-CM | POA: Diagnosis present

## 2023-06-05 DIAGNOSIS — N83202 Unspecified ovarian cyst, left side: Secondary | ICD-10-CM | POA: Diagnosis not present

## 2023-06-05 DIAGNOSIS — N390 Urinary tract infection, site not specified: Secondary | ICD-10-CM | POA: Insufficient documentation

## 2023-06-05 LAB — CBC
HCT: 43.5 % (ref 36.0–46.0)
Hemoglobin: 14.3 g/dL (ref 12.0–15.0)
MCH: 29 pg (ref 26.0–34.0)
MCHC: 32.9 g/dL (ref 30.0–36.0)
MCV: 88.2 fL (ref 80.0–100.0)
Platelets: 274 10*3/uL (ref 150–400)
RBC: 4.93 MIL/uL (ref 3.87–5.11)
RDW: 12.9 % (ref 11.5–15.5)
WBC: 12.2 10*3/uL — ABNORMAL HIGH (ref 4.0–10.5)
nRBC: 0 % (ref 0.0–0.2)

## 2023-06-05 LAB — URINALYSIS, ROUTINE W REFLEX MICROSCOPIC
Bilirubin Urine: NEGATIVE
Glucose, UA: NEGATIVE mg/dL
Hgb urine dipstick: NEGATIVE
Nitrite: NEGATIVE
Protein, ur: NEGATIVE mg/dL
Specific Gravity, Urine: 1.025 (ref 1.005–1.030)
pH: 6.5 (ref 5.0–8.0)

## 2023-06-05 LAB — COMPREHENSIVE METABOLIC PANEL
ALT: 16 U/L (ref 0–44)
AST: 19 U/L (ref 15–41)
Albumin: 4.2 g/dL (ref 3.5–5.0)
Alkaline Phosphatase: 73 U/L (ref 38–126)
Anion gap: 9 (ref 5–15)
BUN: 14 mg/dL (ref 6–20)
CO2: 26 mmol/L (ref 22–32)
Calcium: 9.2 mg/dL (ref 8.9–10.3)
Chloride: 103 mmol/L (ref 98–111)
Creatinine, Ser: 0.97 mg/dL (ref 0.44–1.00)
GFR, Estimated: >60 mL/min (ref >60)
Glucose, Bld: 90 mg/dL (ref 70–99)
Potassium: 3.4 mmol/L — ABNORMAL LOW (ref 3.5–5.1)
Sodium: 138 mmol/L (ref 135–145)
Total Bilirubin: 0.6 mg/dL (ref 0.3–1.2)
Total Protein: 8 g/dL (ref 6.5–8.1)

## 2023-06-05 LAB — POC URINE PREG, ED: Preg Test, Ur: NEGATIVE

## 2023-06-05 LAB — URINALYSIS, MICROSCOPIC (REFLEX)

## 2023-06-05 LAB — LIPASE, BLOOD: Lipase: 36 U/L (ref 11–51)

## 2023-06-05 MED ORDER — CEPHALEXIN 500 MG PO CAPS: 500.0000 mg | ORAL_CAPSULE | Freq: Once | ORAL | Status: DC

## 2023-06-05 NOTE — ED Triage Notes (Signed)
Pt was sent home w/ BV from urgent care yesterday- pt c/o lower abdominal pain and nausea. Pt is AOX4. Pt states is worse during BMs.

## 2023-06-05 NOTE — ED Provider Notes (Signed)
[facility] Provider Note    Event Date/Time   First MD Initiated Contact with Patient 06/05/23 2307     (approximate)   History   Abdominal Pain   HPI  Kylie Martin is a 32 y.o. female with history of PCOS, previous pyelonephritis, trichomonas, HSV 2, kidney stones who presents to the emergency department with complaints of left lower pelvic pain, pain with having bowel movements, dysuria.  No vaginal bleeding or discharge.  No fevers, vomiting or diarrhea but has had some nausea.  She does not think this feels similar to her previous kidney stones.  She was just diagnosed with bacterial vaginosis and put on MetroGel but did not have STI screening.   History provided by patient, significant other.    Past Medical History:  Diagnosis Date   Anemia    HSV-2 (herpes simplex virus 2) infection    Kidney stone    PCOS (polycystic ovarian syndrome)    Pyelonephritis    Sepsis (HCC)    Trichomonas    UTI (urinary tract infection)    seeing urology   Vaccine for human papilloma virus (HPV) types 6, 11, 16, and 18 administered     Past Surgical History:  Procedure Laterality Date   WISDOM TOOTH EXTRACTION      MEDICATIONS:  Prior to Admission medications   Medication Sig Start Date End Date Taking? Authorizing Provider  metroNIDAZOLE (METROGEL) 0.75 % vaginal gel Place 1 Applicatorful vaginally at bedtime for 7 days. 06/04/23 06/11/23  Rodriguez-Southworth, Nettie Elm, PA-C  prazosin (MINIPRESS) 1 MG capsule Take 1 mg by mouth at bedtime. 03/22/23   [provider]  sertraline (ZOLOFT) 50 MG tablet Take 50 mg by mouth daily. 03/22/23   [provider]  traZODone (DESYREL) 50 MG tablet Take 50 mg by mouth at bedtime. 03/22/23   [provider]  WEGOVY 2.4 MG/0.75ML SOAJ Inject into the skin. 05/20/23   [provider]    Physical Exam   Triage Vital Signs: ED Triage Vitals [06/05/23 1918]  Encounter Vitals Group     BP  (!) 144/102     Systolic BP Percentile      Diastolic BP Percentile      Pulse Rate 77     Resp 17     Temp 99 F (37.2 C)     Temp Source Oral     SpO2 98 %     Weight      Height      Head Circumference      Peak Flow      Pain Score 6     Pain Loc      Pain Education      Exclude from Growth Chart     Most recent vital signs: Vitals:   06/05/23 1918  BP: (!) 144/102  Pulse: 77  Resp: 17  Temp: 99 F (37.2 C)  SpO2: 98%    CONSTITUTIONAL: Alert, responds appropriately to questions. Well-appearing; well-nourished HEAD: Normocephalic, atraumatic EYES: Conjunctivae clear, pupils appear equal, sclera nonicteric ENT: normal nose; moist mucous membranes NECK: Supple, normal ROM CARD: RRR; S1 and S2 appreciated RESP: Normal chest excursion without splinting or tachypnea; breath sounds clear and equal bilaterally; no wheezes, no rhonchi, no rales, no hypoxia or respiratory distress, speaking full sentences ABD/GI: Non-distended; soft, mild tenderness in the left pelvic region without guarding or rebound BACK: The back appears normal EXT: Normal ROM in all joints; no deformity noted, no edema SKIN: Normal  color for age and race; warm; no rash on exposed skin NEURO: Moves all extremities equally, normal speech PSYCH: The patient's mood and manner are appropriate.   ED Results / Procedures / Treatments   LABS: (all labs ordered are listed, but only abnormal results are displayed) Labs Reviewed  COMPREHENSIVE METABOLIC PANEL - Abnormal; Notable for the following components:      Result Value   Potassium 3.4 (*)    All other components within normal limits  CBC - Abnormal; Notable for the following components:   WBC 12.2 (*)    All other components within normal limits  URINALYSIS, ROUTINE W REFLEX MICROSCOPIC - Abnormal; Notable for the following components:   Ketones, ur TRACE (*)    Leukocytes,Ua TRACE (*)    All other components within normal limits  URINALYSIS,  MICROSCOPIC (REFLEX) - Abnormal; Notable for the following components:   Bacteria, UA RARE (*)    All other components within normal limits  CHLAMYDIA/NGC RT PCR ([facility] ONLY)            URINE CULTURE  LIPASE, BLOOD  POC URINE PREG, ED     EKG:   RADIOLOGY: My personal review and interpretation of imaging: Ultrasound shows left ovarian cyst without torsion.  I have personally reviewed all radiology reports.   US PELVIC COMPLETE W TRANSVAGINAL AND TORSION R/O  Result Date: 06/06/2023 CLINICAL DATA:  Pelvic pain EXAM: TRANSABDOMINAL ULTRASOUND OF PELVIS DOPPLER ULTRASOUND OF OVARIES TECHNIQUE: Transabdominal ultrasound examination of the pelvis was performed including evaluation of the uterus, ovaries, adnexal regions, and pelvic cul-de-sac. Color and duplex Doppler ultrasound was utilized to evaluate blood flow to the ovaries. COMPARISON:  CT 07/29/2022 FINDINGS: Uterus Measurements: 6.7 x 4.7 x 4.8 cm = volume: 79 mL. No fibroids or other mass visualized. Endometrium Thickness: 10 mm.  No focal abnormality visualized. Right ovary Measurements: 3.9 x 2.6 x 3.2 cm = volume: 16.6 mL. Normal appearance/no adnexal mass. Left ovary Measurements: 5.5 x 4.3 x 4.0 cm = volume: 49.3 mL. Complex cyst in the left ovary measuring 4.7 x 3.9 x 4.6 cm containing multiple thin avascular septations. Pulsed Doppler evaluation demonstrates normal low-resistance arterial and venous waveforms in both ovaries. Other: Trace free fluid around the left ovary. Nabothian cysts in the cervix. IMPRESSION: 1. Complex cyst in the left ovary measuring up to 4.7 cm. This is nonspecific but may represent a hemorrhagic cyst. Recommend follow-up ultrasound in 6-12 weeks to ensure resolution. 2. Small amount of free fluid about the left ovary. 3. No evidence of ovarian torsion. Electronically Signed   By: Minerva Fester M.D.   On: 06/06/2023 00:40     PROCEDURES:  Critical Care performed: No     Procedures    IMPRESSION /  MDM / ASSESSMENT AND PLAN / ED COURSE  I reviewed the triage vital signs and the nursing notes.    Patient here with left pelvic pain, pain with bowel movements and urination.    DIFFERENTIAL DIAGNOSIS (includes but not limited to):   UTI, STI, PID, TOA, ectopic, torsion, ovarian cyst, kidney stone   Patient's presentation is most consistent with acute complicated illness / injury requiring diagnostic workup.   PLAN: Lab work obtained from triage.  Slight leukocytosis of 12,000.  Normal renal function, LFTs and lipase.  Urine does appear infected.  Will add on urine culture and give Keflex.  Will obtain gonorrhea and chlamydia swabs today.  Will obtain transvaginal ultrasound with Doppler.  Her pregnancy test is negative.  She declines any pain or nausea medicine.   MEDICATIONS GIVEN IN ED: Medications  cephALEXin (KEFLEX) capsule 500 mg (has no administration in time range)     ED COURSE: Patient's ultrasound reviewed and interpreted by myself and the radiologist and shows a cyst of the left ovary measuring 4.7 cm that is likely a hemorrhagic cyst but no torsion.  No other acute abnormality.  Gonorrhea and Chlamydia negative.  Patient resting comfortably on reassessment.  Will discharge home with OB/GYN follow-up in 6 weeks to have repeat ultrasound to ensure resolution of the cyst.  Will discharge on Keflex and with pain and nausea medicine.   At this time, I do not feel there is any life-threatening condition present. I reviewed all nursing notes, vitals, pertinent previous records.  All lab and urine results, EKGs, imaging ordered have been independently reviewed and interpreted by myself.  I reviewed all available radiology reports from any imaging ordered this visit.  Based on my assessment, I feel the patient is safe to be discharged home without further emergent workup and can continue workup as an outpatient as needed. Discussed all findings, treatment plan as well as usual and  customary return precautions.  They verbalize understanding and are comfortable with this plan.  Outpatient follow-up has been provided as needed.  All questions have been answered.    CONSULTS:  none   OUTSIDE RECORDS REVIEWED: Reviewed last family medicine note in 2021.       FINAL CLINICAL IMPRESSION(S) / ED DIAGNOSES   Final diagnoses:  Acute UTI  Left ovarian cyst     Rx / DC Orders   ED Discharge Orders          Ordered    cephALEXin (KEFLEX) 500 MG capsule  2 times daily        06/06/23 0128    ibuprofen (ADVIL) 800 MG tablet  Every 8 hours PRN        06/06/23 0128    ondansetron (ZOFRAN-ODT) 4 MG disintegrating tablet  Every 6 hours PRN        06/06/23 0128    HYDROcodone-acetaminophen (NORCO/VICODIN) 5-325 MG tablet  Every 6 hours PRN        06/06/23 0128             Note:  This document was prepared using Dragon voice recognition software and may include unintentional dictation errors.   Ritika Hellickson, Layla Maw, DO 06/06/23 [phone]

## 2023-06-06 LAB — CHLAMYDIA/NGC RT PCR (ARMC ONLY)
Chlamydia Tr: NOT DETECTED
N gonorrhoeae: NOT DETECTED

## 2023-06-06 MED ORDER — ONDANSETRON 4 MG PO TBDP: 4.0000 mg | ORAL_TABLET | Freq: Four times a day (QID) | ORAL | 0 refills | Status: DC | PRN

## 2023-06-06 MED ORDER — CEPHALEXIN 500 MG PO CAPS: 500.0000 mg | ORAL_CAPSULE | Freq: Two times a day (BID) | ORAL | 0 refills | Status: DC

## 2023-06-06 MED ORDER — HYDROCODONE-ACETAMINOPHEN 5-325 MG PO TABS: 2.0000 | ORAL_TABLET | Freq: Four times a day (QID) | ORAL | 0 refills | Status: DC | PRN

## 2023-06-06 MED ORDER — IBUPROFEN 800 MG PO TABS: 800.0000 mg | ORAL_TABLET | Freq: Three times a day (TID) | ORAL | 0 refills | Status: DC | PRN

## 2023-06-06 NOTE — Discharge Instructions (Addendum)

## 2023-06-08 LAB — URINE CULTURE: Culture: >=100000 — AB

## 2023-06-16 ENCOUNTER — Ambulatory Visit: Payer: Self-pay

## 2023-06-16 NOTE — Telephone Encounter (Signed)
Chief Complaint: UTI symptoms Symptoms: Right flank pain  Frequency: comes and goes  Pertinent Negatives: Patient denies burning, itching  Disposition: [] ED /[] Urgent Care (no appt availability in office) / [x] Appointment(In office/virtual)/ []  [facility]/ [] Home Care/ [] Refused Recommended Disposition /[] [facility]/ []  Follow-up with PCP Additional Notes: Patient states she was  seen in at Urgent Care and ED at the end of August and was treated for a UTI and was told she has a cyst. Patient states she took Antibiotics as prescribed and finished the course on Sunday. Patient states she has now developed right side flank pain that is a new onset as of Monday. Patient states she would like to get tested for STIs and make sure she does not have any other infections that was not detected during her ED visit. Care advice was given and patient has been scheduled for an office visit tomorrow at 0900.  Reason for Disposition  [1] Taking antibiotic > 72 hours (3 days) for UTI AND [2] flank or lower back pain is SAME (unchanged, not better)  Answer Assessment - Initial Assessment Questions 1. MAIN SYMPTOM: "What is the main symptom you are concerned about?" (e.g., painful urination, urine frequency)     Right side flank pain 2. BETTER-SAME-WORSE: "Are you getting better, staying the same, or getting worse compared to how you felt at your last visit to the doctor (most recent medical visit)?"     Better  3. PAIN: "How bad is the pain?"  (e.g., Scale 1-10; mild, moderate, or severe)   - MILD (1-3): complains slightly about urination hurting   - MODERATE (4-7): interferes with normal activities     - SEVERE (8-10): excruciating, unwilling or unable to urinate because of the pain      4/10 4. FEVER: "Do you have a fever?" If Yes, ask: "What is it, how was it measured, and when did it start?"     No 5. OTHER SYMPTOMS: "Do you have any other symptoms?" (e.g., blood in the urine, flank  pain, vaginal discharge)     Pain before urination like pressure, dark urine 6. DIAGNOSIS: "When was the UTI diagnosed?" "By whom?" "Was it a kidney infection, bladder infection or both?"     06/05/23 at ED UTI 7. ANTIBIOTIC: "What antibiotic(s) are you taking?" "How many times per day?"     Keflex twice daily for 7 days  8. ANTIBIOTIC - START DATE: "When did you start taking the antibiotic?"     06/06/23  Protocols used: Urinary Tract Infection on Antibiotic Follow-up Call - [facility]

## 2023-06-17 ENCOUNTER — Ambulatory Visit: Payer: Managed Care, Other (non HMO) | Admitting: Internal Medicine

## 2023-06-17 ENCOUNTER — Encounter: Payer: Self-pay | Admitting: Internal Medicine

## 2023-06-17 VITALS — BP 124/78 | HR 99 | Ht 66.0 in | Wt 199.0 lb

## 2023-06-17 DIAGNOSIS — N3001 Acute cystitis with hematuria: Secondary | ICD-10-CM

## 2023-06-17 DIAGNOSIS — N83202 Unspecified ovarian cyst, left side: Secondary | ICD-10-CM | POA: Diagnosis not present

## 2023-06-17 DIAGNOSIS — N200 Calculus of kidney: Secondary | ICD-10-CM | POA: Diagnosis not present

## 2023-06-17 DIAGNOSIS — Z202 Contact with and (suspected) exposure to infections with a predominantly sexual mode of transmission: Secondary | ICD-10-CM

## 2023-06-17 LAB — POCT URINALYSIS DIPSTICK
Bilirubin, UA: NEGATIVE
Glucose, UA: NEGATIVE
Ketones, UA: NEGATIVE
Leukocytes, UA: NEGATIVE
Nitrite, UA: NEGATIVE
Protein, UA: NEGATIVE
Spec Grav, UA: 1.01 (ref 1.010–1.025)
Urobilinogen, UA: 0.2 U/dL
pH, UA: 5 (ref 5.0–8.0)

## 2023-06-17 NOTE — Progress Notes (Signed)
Date:  06/17/2023   Name:  Kylie Martin   DOB:  [birth year]   MRN:  [id number]   Chief Complaint: Flank Pain (Right. Started August 30th. Intermittent now. Going to the bathroom less frequently. No pain while urinating. Patient currently on her menstrual cycle today so she does have blood present in urine.)  Abdominal Pain This is a recurrent problem. The problem has been unchanged. The pain is located in the suprapubic region, RLQ and LLQ. The pain is mild. The quality of the pain is aching. The abdominal pain does not radiate. Associated symptoms include dysuria. Pertinent negatives include no constipation, diarrhea, fever, nausea or vomiting. tiny stones on the left noted; also US showing left ovarian cyst  Dysuria  This is a new problem. The problem has been gradually improving. The quality of the pain is described as aching. The patient is experiencing no pain. Pertinent negatives include no chills, nausea or vomiting. She has tried antibiotics for the symptoms. The treatment provided moderate relief. Her past medical history is significant for kidney stones. tiny stones on the left noted; also US showing left ovarian cyst  PCOS - left ovarian cyst noted on recent US.   STI  concerns - she is having some marital issues and is concerned about possible STI.  She was treated recently for asymptomatic BV. Lab Results  Component Value Date   NA 138 06/05/2023   K 3.4 (L) 06/05/2023   CO2 26 06/05/2023   GLUCOSE 90 06/05/2023   BUN 14 06/05/2023   CREATININE 0.97 06/05/2023   CALCIUM 9.2 06/05/2023   EGFR 97 01/21/2023   GFRNONAA >60 06/05/2023   Lab Results  Component Value Date   CHOL 146 11/13/2015   HDL 52 11/13/2015   LDLCALC 83 11/13/2015   TRIG 54 11/13/2015   CHOLHDL 2.8 11/13/2015   Lab Results  Component Value Date   TSH 3.510 01/21/2023   Lab Results  Component Value Date   HGBA1C 5.4 03/26/2021   Lab Results  Component Value Date   WBC 12.2 (H) 06/05/2023    HGB 14.3 06/05/2023   HCT 43.5 06/05/2023   MCV 88.2 06/05/2023   PLT 274 06/05/2023   Lab Results  Component Value Date   ALT 16 06/05/2023   AST 19 06/05/2023   GGT 9 11/13/2015   ALKPHOS 73 06/05/2023   BILITOT 0.6 06/05/2023   No results found for: "25OHVITD2", "25OHVITD3", "VD25OH"   Review of Systems  Constitutional:  Negative for chills, diaphoresis, fatigue and fever.  Respiratory:  Negative for chest tightness and shortness of breath.   Cardiovascular:  Negative for chest pain and leg swelling.  Gastrointestinal:  Positive for abdominal pain. Negative for blood in stool, constipation, diarrhea, nausea and vomiting.  Genitourinary:  Positive for dysuria and pelvic pain. Negative for genital sores, menstrual problem and vaginal discharge.  Psychiatric/Behavioral:  Positive for dysphoric mood. Negative for sleep disturbance. The patient is nervous/anxious.     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    Allergies  Allergen Reactions   Penicillin G Sodium Hives   Penicillins Rash    "only with penicillin injection"    Past Surgical History:  Procedure Laterality Date   WISDOM TOOTH EXTRACTION      Social History   Tobacco Use   Smoking status: Former  Current packs/day: 0.00    Types: E-cigarettes, Cigarettes    Quit date: 03/23/2010    Years since quitting: 13.2   Smokeless tobacco: Never   Tobacco comments:    Does not use e-cigs  Vaping Use   Vaping status: Former  Substance Use Topics   Alcohol use: Yes    Comment: socially   Drug use: No     Medication list has been reviewed and updated.  Current Meds  Medication Sig   ibuprofen (ADVIL) 800 MG tablet Take 1 tablet (800 mg total) by mouth every 8 (eight) hours as needed.   prazosin (MINIPRESS) 1 MG capsule Take 1 mg by mouth at  bedtime.   sertraline (ZOLOFT) 50 MG tablet Take 50 mg by mouth daily.   traZODone (DESYREL) 50 MG tablet Take 50 mg by mouth at bedtime.   WEGOVY 2.4 MG/0.75ML SOAJ Inject into the skin.   [DISCONTINUED] ondansetron (ZOFRAN-ODT) 4 MG disintegrating tablet Take 1 tablet (4 mg total) by mouth every 6 (six) hours as needed for nausea or vomiting.       06/17/2023    8:59 AM 01/21/2023    1:41 PM  GAD 7 : Generalized Anxiety Score  Nervous, Anxious, on Edge 2 0  Control/stop worrying 2 0  Worry too much - different things 2 0  Trouble relaxing 0 0  Restless 0 0  Easily annoyed or irritable 2 0  Afraid - awful might happen 0 0  Total GAD 7 Score 8 0  Anxiety Difficulty Somewhat difficult Not difficult at all       06/17/2023    8:59 AM 01/21/2023    1:40 PM  Depression screen PHQ 2/9  Decreased Interest 1 0  Down, Depressed, Hopeless 1 1  PHQ - 2 Score 2 1  Altered sleeping 2 1  Tired, decreased energy 3 2  Change in appetite 3 0  Feeling bad or failure about yourself  1 0  Trouble concentrating 3 0  Moving slowly or fidgety/restless 0 0  Suicidal thoughts 0 0  PHQ-9 Score 14 4  Difficult doing work/chores Very difficult Not difficult at all    BP Readings from Last 3 Encounters:  06/17/23 124/78  06/06/23 132/84  06/04/23 (!) 144/78    Physical Exam Vitals and nursing note reviewed.  Constitutional:      General: She is not in acute distress.    Appearance: Normal appearance. She is well-developed.  HENT:     Head: Normocephalic and atraumatic.  Cardiovascular:     Rate and Rhythm: Normal rate and regular rhythm.  Pulmonary:     Effort: Pulmonary effort is normal. No respiratory distress.     Breath sounds: No wheezing or rhonchi.  Abdominal:     General: Abdomen is flat. Bowel sounds are normal.     Palpations: Abdomen is soft.     Tenderness: There is abdominal tenderness in the right lower quadrant, suprapubic area and left lower quadrant. There is no right  CVA tenderness, left CVA tenderness, guarding or rebound.  Skin:    General: Skin is warm and dry.     Findings: No rash.  Neurological:     Mental Status: She is alert and oriented to person, place, and time.  Psychiatric:        Mood and Affect: Mood normal.        Behavior: Behavior normal.     Wt Readings from Last 3 Encounters:  06/17/23 199 lb (90.3 kg)  06/04/23 216 lb 0.8 oz (98 kg)  01/21/23 216 lb (98 kg)    BP 124/78   Pulse 99   Ht 5\' 6"  (1.676 m)   Wt 199 lb (90.3 kg)   LMP 06/17/2023 (Exact Date)   SpO2 97%   BMI 32.12 kg/m   Assessment and Plan:  Problem List Items Addressed This Visit   None Visit Diagnoses     Acute cystitis with hematuria    -  Primary   appears to have resolved suspect discomfort is resolving bladder inflammation and ovarian cyst recommend Urology evaluation   Relevant Orders   POCT Urinalysis Dipstick (Completed)   Ambulatory referral to Urology   Renal calculi       Relevant Orders   Ambulatory referral to Urology   Cyst of left ovary       Recommend Advil 1-2 times per day. Follow up with GYN if needed   Exposure to sexually transmitted disease (STD)       Relevant Orders   HIV Antibody (routine testing w rflx)   GC/Chlamydia Probe Amp   RPR       No follow-ups on file.    Reubin Milan, MD [facility] [place]

## 2023-06-18 LAB — RPR: RPR Ser Ql: NONREACTIVE

## 2023-06-18 LAB — HIV ANTIBODY (ROUTINE TESTING W REFLEX): HIV Screen 4th Generation wRfx: NONREACTIVE

## 2023-06-20 LAB — GC/CHLAMYDIA PROBE AMP
Chlamydia trachomatis, NAA: NEGATIVE
Neisseria Gonorrhoeae by PCR: NEGATIVE

## 2023-07-28 ENCOUNTER — Telehealth: Payer: Managed Care, Other (non HMO) | Admitting: Physician Assistant

## 2023-07-28 ENCOUNTER — Encounter: Admitting: Physician Assistant

## 2023-07-28 DIAGNOSIS — A084 Viral intestinal infection, unspecified: Secondary | ICD-10-CM | POA: Diagnosis not present

## 2023-07-28 MED ORDER — ONDANSETRON 4 MG PO TBDP
4.0000 mg | ORAL_TABLET | Freq: Three times a day (TID) | ORAL | 0 refills | Status: AC | PRN
Start: 2023-07-28 — End: ?

## 2023-07-28 MED ORDER — LOPERAMIDE HCL 2 MG PO TABS
2.0000 mg | ORAL_TABLET | Freq: Four times a day (QID) | ORAL | 0 refills | Status: AC | PRN
Start: 2023-07-28 — End: ?

## 2023-07-28 MED ORDER — DICYCLOMINE HCL 10 MG PO CAPS: 10.0000 mg | ORAL_CAPSULE | Freq: Three times a day (TID) | ORAL | 0 refills | Status: AC

## 2023-07-28 NOTE — Telephone Encounter (Signed)
This encounter was created in error - please disregard.

## 2023-07-28 NOTE — Progress Notes (Signed)
Duplicate.  Patient scheduled a Video

## 2023-07-28 NOTE — Progress Notes (Signed)
Virtual Visit Consent   Kylie Martin, you are scheduled for a virtual visit with a [facility] provider today. Just as with appointments in the office, your consent must be obtained to participate. Your consent will be active for this visit and any virtual visit you may have with one of our providers in the next 365 days. If you have a MyChart account, a copy of this consent can be sent to you electronically.  As this is a virtual visit, video technology does not allow for your provider to perform a traditional examination. This may limit your provider's ability to fully assess your condition. If your provider identifies any concerns that need to be evaluated in person or the need to arrange testing (such as labs, EKG, etc.), we will make arrangements to do so. Although advances in technology are sophisticated, we cannot ensure that it will always work on either your end or our end. If the connection with a video visit is poor, the visit may have to be switched to a telephone visit. With either a video or telephone visit, we are not always able to ensure that we have a secure connection.  By engaging in this virtual visit, you consent to the provision of healthcare and authorize for your insurance to be billed (if applicable) for the services provided during this visit. Depending on your insurance coverage, you may receive a charge related to this service.  I need to obtain your verbal consent now. Are you willing to proceed with your visit today? Kylie Martin has provided verbal consent on 07/28/2023 for a virtual visit (video or telephone). Margaretann Loveless, PA-C  Date: 07/28/2023 9:04 AM  Virtual Visit via Video Note   I, Margaretann Loveless, connected with  Kylie Martin  ([id number], 02-10-1991) on 07/28/23 at  9:00 AM EDT by a video-enabled telemedicine application and verified that I am speaking with the correct person using two identifiers.  Location: Patient: Virtual Visit Location  Patient: Home Provider: Virtual Visit Location Provider: Home Office   I discussed the limitations of evaluation and management by telemedicine and the availability of in person appointments. The patient expressed understanding and agreed to proceed.    History of Present Illness: Kylie Martin is a 32 y.o. who identifies as a female who was assigned female at birth, and is being seen today for flu-like illness.  HPI: Influenza This is a new problem. The current episode started yesterday. The problem occurs constantly. The problem has been gradually worsening. Associated symptoms include chills, fatigue, a fever (101.4 highest), headaches, myalgias, nausea and vomiting. Pertinent negatives include no congestion, diaphoresis or sore throat. Associated symptoms comments: Diarrhea, general malaise. The symptoms are aggravated by eating and drinking. She has tried acetaminophen, sleep and rest (pepto bismol) for the symptoms. The treatment provided no relief.  Daughter had similar symptoms.   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    Allergies:  Allergies  Allergen Reactions   Penicillin G Sodium Hives   Penicillins Rash    "only with penicillin injection"   Medications:  Current Outpatient Medications:    dicyclomine (BENTYL) 10 MG capsule, Take 1 capsule (10 mg total) by mouth 4 (four) times daily -  before meals and at bedtime., Disp: 28 capsule, Rfl: 0   loperamide (IMODIUM A-D)  2 MG tablet, Take 1 tablet (2 mg total) by mouth 4 (four) times daily as needed for diarrhea or loose stools., Disp: 30 tablet, Rfl: 0   ondansetron (ZOFRAN-ODT) 4 MG disintegrating tablet, Take 1-2 tablets (4-8 mg total) by mouth every 8 (eight) hours as needed., Disp: 20 tablet, Rfl: 0   ibuprofen (ADVIL) 800 MG tablet, Take 1 tablet (800  mg total) by mouth every 8 (eight) hours as needed., Disp: 30 tablet, Rfl: 0   prazosin (MINIPRESS) 1 MG capsule, Take 1 mg by mouth at bedtime., Disp: , Rfl:    sertraline (ZOLOFT) 50 MG tablet, Take 50 mg by mouth daily., Disp: , Rfl:    traZODone (DESYREL) 50 MG tablet, Take 50 mg by mouth at bedtime., Disp: , Rfl:    WEGOVY 2.4 MG/0.75ML SOAJ, Inject into the skin., Disp: , Rfl:   Observations/Objective: Patient is well-developed, well-nourished in no acute distress.  Resting comfortably at home.  Head is normocephalic, atraumatic.  No labored breathing.  Speech is clear and coherent with logical content.  Patient is alert and oriented at baseline.    Assessment and Plan: 1. Viral gastroenteritis - ondansetron (ZOFRAN-ODT) 4 MG disintegrating tablet; Take 1-2 tablets (4-8 mg total) by mouth every 8 (eight) hours as needed.  Dispense: 20 tablet; Refill: 0 - loperamide (IMODIUM A-D) 2 MG tablet; Take 1 tablet (2 mg total) by mouth 4 (four) times daily as needed for diarrhea or loose stools.  Dispense: 30 tablet; Refill: 0 - dicyclomine (BENTYL) 10 MG capsule; Take 1 capsule (10 mg total) by mouth 4 (four) times daily -  before meals and at bedtime.  Dispense: 28 capsule; Refill: 0  - Suspect viral gastroenteritis - Zofran for nausea - Imodium for diarrhea - Bentyl for cramping - Push fluids, electrolyte beverages - Liquid diet, then increase to soft/bland (BRAT) diet over next day, then increase diet as tolerated - Seek in person evaluation if not improving or symptoms worsen   Follow Up Instructions: I discussed the assessment and treatment plan with the patient. The patient was provided an opportunity to ask questions and all were answered. The patient agreed with the plan and demonstrated an understanding of the instructions.  A copy of instructions were sent to the patient via MyChart unless otherwise noted below.    The patient was advised to call back or seek an in-person  evaluation if the symptoms worsen or if the condition fails to improve as anticipated.    Margaretann Loveless, PA-C

## 2023-07-28 NOTE — Patient Instructions (Signed)
Kylie Martin, thank you for joining Margaretann Loveless, PA-C for today's virtual visit.  While this provider is not your primary care provider (PCP), if your PCP is located in our provider database this encounter information will be shared with them immediately following your visit.   A [facility] MyChart account gives you access to today's visit and all your visits, tests, and labs performed at [facility] " click here if you don't have a [facility] MyChart account or go to [email or web address]  Consent: (Patient) Kylie Martin provided verbal consent for this virtual visit at the beginning of the encounter.  Current Medications:  Current Outpatient Medications:    dicyclomine (BENTYL) 10 MG capsule, Take 1 capsule (10 mg total) by mouth 4 (four) times daily -  before meals and at bedtime., Disp: 28 capsule, Rfl: 0   loperamide (IMODIUM A-D) 2 MG tablet, Take 1 tablet (2 mg total) by mouth 4 (four) times daily as needed for diarrhea or loose stools., Disp: 30 tablet, Rfl: 0   ondansetron (ZOFRAN-ODT) 4 MG disintegrating tablet, Take 1-2 tablets (4-8 mg total) by mouth every 8 (eight) hours as needed., Disp: 20 tablet, Rfl: 0   ibuprofen (ADVIL) 800 MG tablet, Take 1 tablet (800 mg total) by mouth every 8 (eight) hours as needed., Disp: 30 tablet, Rfl: 0   prazosin (MINIPRESS) 1 MG capsule, Take 1 mg by mouth at bedtime., Disp: , Rfl:    sertraline (ZOLOFT) 50 MG tablet, Take 50 mg by mouth daily., Disp: , Rfl:    traZODone (DESYREL) 50 MG tablet, Take 50 mg by mouth at bedtime., Disp: , Rfl:    WEGOVY 2.4 MG/0.75ML SOAJ, Inject into the skin., Disp: , Rfl:    Medications ordered in this encounter:  Meds ordered this encounter  Medications   ondansetron (ZOFRAN-ODT) 4 MG disintegrating tablet    Sig: Take 1-2 tablets (4-8 mg total) by mouth every 8 (eight) hours as needed.    Dispense:  20 tablet    Refill:  0    Order Specific Question:   Supervising Provider     Answer:   Merrilee Jansky [id number]   loperamide (IMODIUM A-D) 2 MG tablet    Sig: Take 1 tablet (2 mg total) by mouth 4 (four) times daily as needed for diarrhea or loose stools.    Dispense:  30 tablet    Refill:  0    Order Specific Question:   Supervising Provider    Answer:   Merrilee Jansky [[id number]]   dicyclomine (BENTYL) 10 MG capsule    Sig: Take 1 capsule (10 mg total) by mouth 4 (four) times daily -  before meals and at bedtime.    Dispense:  28 capsule    Refill:  0    Order Specific Question:   Supervising Provider    Answer:   Merrilee Jansky [[id number]]     *If you need refills on other medications prior to your next appointment, please contact your pharmacy*  Follow-Up: Call back or seek an in-person evaluation if the symptoms worsen or if the condition fails to improve as anticipated.  [facility] [phone]  Other Instructions Norovirus Infection Norovirus infection causes inflammation in the stomach and intestines (gastroenteritis) and food poisoning. It is caused by exposure to a virus from a group of similar viruses called noroviruses. Norovirus spreads very easily from person to person (is very contagious). It often occurs in places where people are  in close contact, such as schools, nursing homes, restaurants, and cruise ships. You can get it from food, water, surfaces, or other people who have the virus. Norovirus is also found in the stool (feces) or vomit of infected people. You can spread the infection as soon as you feel sick, and you may continue to be contagious after you recover. What are the causes? This condition is caused by contact with norovirus. You can catch norovirus if you: Eat or drink something that is contaminated with norovirus. Touch surfaces or objects that are contaminated with norovirus and then put your hand in or by your mouth or nose. Have direct contact with an infected person who may or may not still have  symptoms. Share food, drink, or utensils with someone who is contagious with norovirus. What are the signs or symptoms? Symptoms usually begin within 12 hours to 2 days after you become infected. Most norovirus symptoms affect the digestive system.Symptoms may include: Nausea, vomiting, and diarrhea. Stomach cramps. Fever. Chills. Headache. Muscle aches and tiredness. How is this diagnosed? This condition may be diagnosed based on: Your symptoms. A physical exam. A stool test. How is this treated? There is no specific treatment for norovirus. Most people get better without treatment in about 2 days. Young children, the elderly, and people who are already sick may take up to 6 days to recover. Follow these instructions at home:  Eating and drinking  Drink plenty of water to replace fluids that are lost through diarrhea and vomiting. This prevents dehydration. Drink enough fluid to keep your urine pale yellow. Drink clear fluids in small amounts as you are able. Clear fluids include water, ice chips, fruit juice with water added (diluted fruit juice), and low-calorie sports drinks. Avoid fluids that contain a lot of sugar or caffeine, such as energy drinks, sports drinks, and soda. Avoid alcohol. If instructed by your health care provider, drink an oral rehydration solution (ORS). This is a drink that is sold at pharmacies and retail stores. An ORS contains minerals (electrolytes) that you can lose through diarrhea and vomiting. Eat bland, easy-to-digest foods in small amounts as you are able. These foods include rice, lean meats, toast, and crackers. Avoid spicy or fatty foods. General instructions Rest at home while you recover. Do not prepare food for others while you are infected. Wait at least 3 days after you recover from the illness to do this. Take over-the-counter and prescription medicines only as told by your health care provider. Wash your hands frequently with soap and  water for at least 20 seconds. Alcohol-based hand sanitizer can be used in addition to soap and water, but sanitizer should not be the only cleansing method because it is not effective at removing norovirus from your hands or surfaces. Make sure that each person in your household washes his or her hands well and often. Keep all follow-up visits. This is important. How is this prevented? To help prevent the spread of norovirus: Stay at home if you are feeling sick. This will reduce the risk of spreading the virus to others. Wash your hands often with soap and water for at least 20 seconds, especially after using the toilet, helping a child use the toilet, or changing a child's diaper. Wash fruits and vegetables thoroughly before peeling, preparing, or serving them. Throw out any food that a sick person may have touched. Disinfect contaminated surfaces immediately after someone in the household has been sick. Disinfect frequently used surfaces, such as counters, doorknobs,  and faucets. Use a bleach-based household cleaner. Immediately remove and wash soiled clothes or sheets. Contact a health care provider if: You have vomiting, diarrhea, or stomach pain that gets worse. You have symptoms that do not go away after 3-6 days. You have a fever. You cannot drink without vomiting. You feel light-headed or dizzy. Your symptoms get worse. Get help right away if: You develop symptoms of dehydration that do not improve with fluid replacement, such as: Excessive sleepiness. Lack of tears. Very little urine production. Dry mouth. Muscle cramps. Weak pulse. Confusion. Summary Norovirus infection is common and often occurs in places where people are in close contact, such as schools, nursing homes, restaurants, and cruise ships. To help prevent the spread of this infection, wash hands with soap and water for at least 20 seconds before handling food or after having contact with stool or body  fluids. There is no specific treatment for norovirus, but most people get better without treatment in about 2 days. People who are healthy when infected often recover sooner than those who are elderly, young, or already sick. Replace lost fluids by drinking plenty of water, or by drinking oral rehydration solution (ORS), which contains important minerals called electrolytes. This prevents dehydration. This information is not intended to replace advice given to you by your health care provider. Make sure you discuss any questions you have with your health care provider. Document Revised: 04/30/2021 Document Reviewed: 04/30/2021 Elsevier Patient Education  2024 Elsevier Inc.    If you have been instructed to have an in-person evaluation today at a local Urgent Care facility, please use the link below. It will take you to a list of all of our available [facility], including address, phone number and hours of operation. Please do not delay care.  [facility]  If you or a family member do not have a primary care provider, use the link below to schedule a visit and establish care. When you choose a [facility] primary care physician or advanced practice provider, you gain a long-term partner in health. Find a Primary Care Provider  Learn more about [facility]'s in-office and virtual care options:  - Get Care Now

## 2023-08-26 ENCOUNTER — Ambulatory Visit
Admission: EM | Admit: 2023-08-26 | Discharge: 2023-08-26 | Disposition: A | Discharge status: Home / Self Care | Payer: Managed Care, Other (non HMO) | Attending: Emergency Medicine | Admitting: Emergency Medicine

## 2023-08-26 DIAGNOSIS — R300 Dysuria: Secondary | ICD-10-CM | POA: Diagnosis present

## 2023-08-26 LAB — WET PREP, GENITAL
Clue Cells Wet Prep HPF POC: NONE SEEN
Sperm: NONE SEEN
Trich, Wet Prep: NONE SEEN
WBC, Wet Prep HPF POC: <10 (ref <10)
Yeast Wet Prep HPF POC: NONE SEEN

## 2023-08-26 LAB — URINALYSIS, W/ REFLEX TO CULTURE (INFECTION SUSPECTED)
Bacteria, UA: NONE SEEN
Bilirubin Urine: NEGATIVE
Glucose, UA: NEGATIVE mg/dL
Hgb urine dipstick: NEGATIVE
Ketones, ur: NEGATIVE mg/dL
Leukocytes,Ua: NEGATIVE
Nitrite: NEGATIVE
Protein, ur: NEGATIVE mg/dL
RBC / HPF: NONE SEEN RBC/hpf (ref 0–5)
Specific Gravity, Urine: 1.015 (ref 1.005–1.030)
pH: 7 (ref 5.0–8.0)

## 2023-08-26 NOTE — ED Provider Notes (Signed)
[facility]    CSN: [id number] Arrival date & time: 08/26/23  1330      History   Chief Complaint Chief Complaint  Patient presents with   Dysuria    HPI Kylie Martin is a 32 y.o. female.   HPI  32 year old female with a past medical history significant for UTI, sepsis, pyelonephritis, renal stones, anemia, trichomonas, PCOS, and HSV-2 presents for evaluation of lower abdominal pressure with urinary frequency and dysuria.  She also reports that her urine has been cloudy but she denies any blood in her urine.  She also was experiencing nausea but no vomiting.  She does not have back pain all the time but sometimes at the end of voiding she will have some discomfort in her low back that is transient.  No vaginal discharge or itching.  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 Authorizing Provider  dicyclomine (BENTYL) 10 MG capsule Take 1 capsule (10 mg total) by mouth 4 (four) times daily -  before meals and at bedtime. 07/28/23  Yes Margaretann Loveless, PA-C  loperamide (IMODIUM A-D) 2 MG tablet Take 1 tablet (2  mg total) by mouth 4 (four) times daily as needed for diarrhea or loose stools. 07/28/23  Yes Margaretann Loveless, PA-C  ondansetron (ZOFRAN-ODT) 4 MG disintegrating tablet Take 1-2 tablets (4-8 mg total) by mouth every 8 (eight) hours as needed. 07/28/23  Yes Margaretann Loveless, PA-C  prazosin (MINIPRESS) 1 MG capsule Take 1 mg by mouth at bedtime. 03/22/23  Yes [provider]  sertraline (ZOLOFT) 50 MG tablet Take 50 mg by mouth daily. 03/22/23  Yes [provider]  traZODone (DESYREL) 50 MG tablet Take 50 mg by mouth at bedtime. 03/22/23  Yes [provider]  WEGOVY 2.4 MG/0.75ML SOAJ Inject into the skin. 05/20/23  Yes [provider]  ibuprofen (ADVIL) 800 MG tablet Take 1 tablet (800 mg total) by mouth every 8 (eight) hours as needed. 06/06/23   Ward, Layla Maw, DO    Family History Family History  Problem Relation Age of Onset   Hypertension Mother    Skin cancer Mother    Cancer Father    Hypertension Father    Diabetes Father  COPD Sister        born with RSV has immune problems with lung infections   Stroke Maternal Uncle    Hypertension Paternal Aunt    Hypertension Paternal Aunt    Brain cancer Paternal Aunt    Cancer Maternal Grandmother        lung cancer   Hypertension Maternal Grandfather    Cancer Maternal Grandfather        lung   Stroke Paternal Grandmother    Cancer Paternal Grandfather        lung   Breast cancer Neg Hx     Social History Social History   Tobacco Use   Smoking status: Former    Current packs/day: 0.00    Types: E-cigarettes, Cigarettes    Quit date: 03/23/2010    Years since quitting: 13.4   Smokeless tobacco: Never   Tobacco comments:    Does not use e-cigs  Vaping Use   Vaping status: Former  Substance Use Topics   Alcohol use: Yes    Comment: socially   Drug use: No     Allergies   Penicillin g sodium and Penicillins   Review of Systems Review of Systems  Constitutional:  Negative  for fever.  Gastrointestinal:  Positive for abdominal pain and nausea. Negative for vomiting.  Genitourinary:  Positive for dysuria and frequency. Negative for hematuria, urgency, vaginal discharge and vaginal pain.  Musculoskeletal:  Positive for back pain.     Physical Exam Triage Vital Signs ED Triage Vitals  Encounter Vitals Group     BP      Systolic BP Percentile      Diastolic BP Percentile      Pulse      Resp      Temp      Temp src      SpO2      Weight      Height      Head Circumference      Peak Flow      Pain Score      Pain Loc      Pain Education      Exclude from Growth Chart    No data found.  Updated Vital Signs BP 130/84 (BP Location: Left Arm)   Pulse 72   Temp 98.1 F (36.7 C) (Oral)   Resp 16   Ht 5\' 6"  (1.676 m)   Wt 198 lb (89.8 kg)   LMP 08/11/2023 (Exact Date)   SpO2 98%   BMI 31.96 kg/m   Visual Acuity Right Eye Distance:   Left Eye Distance:   Bilateral Distance:    Right Eye Near:   Left Eye Near:    Bilateral Near:     Physical Exam Vitals and nursing note reviewed.  Constitutional:      Appearance: Normal appearance. She is not ill-appearing.  HENT:     Head: Normocephalic and atraumatic.  Cardiovascular:     Rate and Rhythm: Normal rate and regular rhythm.     Pulses: Normal pulses.     Heart sounds: Normal heart sounds. No murmur heard.    No friction rub. No gallop.  Pulmonary:     Effort: Pulmonary effort is normal.     Breath sounds: Normal breath sounds. No wheezing, rhonchi or rales.  Abdominal:     General: Abdomen is flat.     Palpations: Abdomen is soft.     Tenderness: There is abdominal tenderness. There is no right CVA tenderness,  left CVA tenderness, guarding or rebound.     Comments: Mild suprapubic tenderness without guarding or rebound.  Mild tenderness to the left adnexal region.  Skin:    General: Skin is warm and dry.     Capillary Refill: Capillary refill takes less than 2 seconds.   Neurological:     General: No focal deficit present.     Mental Status: She is alert and oriented to person, place, and time.      UC Treatments / Results  Labs (all labs ordered are listed, but only abnormal results are displayed) Labs Reviewed  WET PREP, GENITAL  URINALYSIS, W/ REFLEX TO CULTURE (INFECTION SUSPECTED)    EKG   Radiology No results found.  Procedures Procedures (including critical care time)  Medications Ordered in UC Medications - No data to display  Initial Impression / Assessment and Plan / UC Course  I have reviewed the triage vital signs and the nursing notes.  Pertinent labs & imaging results that were available during my care of the patient were reviewed by me and considered in my medical decision making (see chart for details).   Patient is a pleasant, nontoxic-appearing 32 year old female presenting for evaluation of genitourinary symptoms as outlined in HPI above.  She does have a history of urinary tract infections as well as pyelonephritis and polycystic ovarian syndrome.  She most recently had a large ovarian cyst on the left ovary back in August and she is unsure if it has resolved or not.  She does have mild tenderness when palpating in that area.  No CVA tenderness on exam and the remainder of her physical exam is benign.  I will order a urinalysis to evaluate for the presence of UTI as well as a vaginal wet prep to evaluate for the presence of possible BV or yeast which may be contributing to the lower abdominal pressure symptoms.  Urinalysis was negative for leukocyte esterase, nitrates, or protein.  Also no hemoglobin.  Reflex microscopy is unremarkable.  Vaginal wet prep is unremarkable.  I discussed the results with the patient and she reports that the symptoms she is experiencing are similar to when she had her ovarian cyst in the past.  She has an upcoming appoint with her OB/GYN and I have encouraged her to keep that appointment and to  try make an appointment for sooner if possible.  I have also advised her that if she develops any sharp abdominal pain, fevers, or nausea and vomiting and she cannot keep down fluids that she should seek care in the ER.   Final Clinical Impressions(s) / UC Diagnoses   Final diagnoses:  Dysuria     Discharge Instructions      As we discussed, there is no evidence of urinary tract or vaginal infection on your testing today.  Your symptoms may be coming from another ovarian cyst that is forming and you need an ultrasound to determine that.  Keep your appointment with your OB/GYN for follow-up and call the office to see if you could be seen sooner if possible.  If you develop any sharp abdominal pain, fevers, or nausea and vomiting where you cannot keep down medications or fluids please go to the ER for evaluation.     ED Prescriptions   None    PDMP not reviewed this encounter.   Becky Augusta, NP 08/26/23 1420

## 2023-08-26 NOTE — ED Triage Notes (Addendum)
Pt c/o lower abd pressure, urinary freq & pain x3 days. Denies any hematuria.

## 2023-08-26 NOTE — Discharge Instructions (Addendum)
As we discussed, there is no evidence of urinary tract or vaginal infection on your testing today.  Your symptoms may be coming from another ovarian cyst that is forming and you need an ultrasound to determine that.  Keep your appointment with your OB/GYN for follow-up and call the office to see if you could be seen sooner if possible.  If you develop any sharp abdominal pain, fevers, or nausea and vomiting where you cannot keep down medications or fluids please go to the ER for evaluation.

## 2024-10-03 ENCOUNTER — Telehealth: Admitting: Emergency Medicine

## 2024-10-03 DIAGNOSIS — R300 Dysuria: Secondary | ICD-10-CM

## 2024-10-03 NOTE — Progress Notes (Signed)
" °  Because you have a fever and back pain, you urinary tract infection is more complicated and I feel your condition warrants further evaluation and I recommend that you be seen in a face-to-face visit to get urine testing.    NOTE: There will be NO CHARGE for this E-Visit   If you are having a true medical emergency, please call 911.     For an urgent face to face visit, [facility] has multiple urgent care centers for your convenience.  Click the link below for the full list of locations and hours, walk-in wait times, appointment scheduling options and driving directions:  Urgent Care - [place], [place], [place], [place], [place], [place]       Your MyChart E-visit questionnaire answers were reviewed by a board certified advanced clinical practitioner to complete your personal care plan based on your specific symptoms.    Thank you for using e-Visits.    "
